# Patient Record
Sex: Male | Born: 1950 | Race: Black or African American | Hispanic: No | Marital: Married | Smoking: Former smoker
Health system: Southern US, Community
[De-identification: ages and names within clinical notes are randomized; demographics above are authoritative.]

## PROBLEM LIST (undated history)

## (undated) DIAGNOSIS — M1990 Unspecified osteoarthritis, unspecified site: Secondary | ICD-10-CM

## (undated) DIAGNOSIS — I10 Essential (primary) hypertension: Secondary | ICD-10-CM

## (undated) HISTORY — DX: Essential (primary) hypertension: I10

## (undated) HISTORY — DX: Unspecified osteoarthritis, unspecified site: M19.90

---

## 2014-10-20 LAB — HM COLONOSCOPY

## 2020-01-12 ENCOUNTER — Inpatient Hospital Stay: Payer: Medicare Other | Attending: Oncology | Admitting: Oncology

## 2020-01-12 ENCOUNTER — Other Ambulatory Visit: Payer: Self-pay

## 2020-01-12 VITALS — BP 152/65 | HR 53 | Temp 97.1°F | Resp 18 | Ht 70.5 in | Wt 204.4 lb

## 2020-01-12 DIAGNOSIS — Z7189 Other specified counseling: Secondary | ICD-10-CM | POA: Insufficient documentation

## 2020-01-12 DIAGNOSIS — G629 Polyneuropathy, unspecified: Secondary | ICD-10-CM

## 2020-01-12 DIAGNOSIS — I10 Essential (primary) hypertension: Secondary | ICD-10-CM

## 2020-01-12 DIAGNOSIS — C680 Malignant neoplasm of urethra: Secondary | ICD-10-CM | POA: Insufficient documentation

## 2020-01-12 DIAGNOSIS — E785 Hyperlipidemia, unspecified: Secondary | ICD-10-CM | POA: Diagnosis not present

## 2020-01-12 DIAGNOSIS — C774 Secondary and unspecified malignant neoplasm of inguinal and lower limb lymph nodes: Secondary | ICD-10-CM

## 2020-01-12 DIAGNOSIS — Z79899 Other long term (current) drug therapy: Secondary | ICD-10-CM

## 2020-01-12 DIAGNOSIS — Z9221 Personal history of antineoplastic chemotherapy: Secondary | ICD-10-CM

## 2020-01-12 DIAGNOSIS — Z923 Personal history of irradiation: Secondary | ICD-10-CM

## 2020-01-12 NOTE — Progress Notes (Signed)
[facility] OFF [facility] - Other   [id number]:Atezolizumab 1,200 mg q21 Days:   A cycle is every 21 days:     Atezolizumab   **Always confirm dose/schedule in your pharmacy ordering system**  Patient Characteristics: Intent of Therapy: Curative Intent, Discussed with Patient

## 2020-01-12 NOTE — Progress Notes (Signed)
Reason for the request:    Carcinoma of the distal urethra  HPI: I was asked by Dr. Daine Gravel  to evaluate Mario Hubbard for the diagnosis of urethral cancer. He is a 69 year old man relocated from [place] and previously was diagnosed with advanced malignancy while living in [place]. In 2015 he was found to have a left inguinal adenopathy that was biopsied which showed metastatic moderately differentiated squamous cell carcinoma. 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 He underwent surgical resection followed by radiation therapy.  He subsequently developed metastatic disease with right-sided pelvic lymph node and confirmed the presence of relapsed disease that is PET avid in 2017. He was treated with carboplatin and Taxol initially with excellent response after 3 cycles. 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 He did show worsening disease in October 2018 and subsequently was started on Tecentriq. He relocated to [place] and received Tecentriq maintenance therapy with excellent response. CT scan in August 2021 continues to show no evidence of disease. 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   He feels reasonably well without any hematuria dysuria. He denies any weight loss or appetite changes. His performance status quality of life remained excellent.  He does not report any headaches, blurry vision, syncope or seizures. Does not report any fevers, chills or sweats.  Does not report any cough, wheezing or hemoptysis.  Does not report any chest pain, palpitation, orthopnea or leg edema.  Does not report any nausea, vomiting or abdominal pain.  Does not report any constipation or diarrhea.  Does not report any skeletal complaints.    Does not report  frequency, urgency or hematuria.  Does not report any skin rashes or lesions. Does not report any heat or cold intolerance.  Does not report any lymphadenopathy or petechiae.  Does not report any anxiety or depression.  Remaining review of systems is negative.    He has past medical history significant for hypertension and hyperlipidemia.  Medication reviewed today.  He has no allergies.  Social History  Denied smoking or excessive alcohol use. Socioeconomic History  . Marital status: Married    Spouse name: Not on file  . Number of children: Not on file  . Years of education: Not on file  . Highest education level: Not on file  Occupational History  . Not on file  Tobacco Use  . Smoking status: Not on file  Substance and Sexual Activity  . Alcohol use: Not on file  . Drug use: Not on file  . Sexual activity: Not on file  Other Topics Concern  . Not on file  Social History Narrative  . Not on file   Social Determinants of Health   Financial Resource Strain:   . Difficulty of Paying Living Expenses: Not on file  Food Insecurity:   . Worried About Charity fundraiser in the Last Year: Not on file  . Ran Out of Food in the Last Year: Not on file  Transportation Needs:   . Lack of Transportation (Medical): Not on file  . Lack of Transportation (Non-Medical): Not on file  Physical Activity:   . Days of Exercise per Week: Not on file  . Minutes of Exercise per Session: Not on file  Stress:   . Feeling of Stress : Not on file  Social Connections:   . Frequency of Communication with Friends and Family: Not on file  . Frequency of Social Gatherings with Friends and Family: Not on file  . Attends Religious Services: Not on file  . Active Member of Clubs or Organizations: Not on file  . Attends Archivist Meetings: Not on file  . Marital Status: Not on file  Intimate Partner Violence:   . Fear of Current or Ex-Partner: Not on file  . Emotionally Abused: Not on  file  . Physically Abused: Not on file  . Sexually Abused: Not on file  :  Pertinent items are noted in HPI.  Exam:  General appearance: alert and cooperative appeared without distress. Head: atraumatic without any abnormalities. Eyes: conjunctivae/corneas clear. PERRL.  Sclera anicteric. Throat: lips, mucosa, and tongue normal; without oral thrush or ulcers. Resp: clear to auscultation bilaterally without rhonchi, wheezes or dullness to percussion. Cardio: regular rate and rhythm, S1, S2 normal, no murmur, click, rub or gallop GI: soft, non-tender; bowel sounds normal; no masses,  no organomegaly Skin: Skin color, texture, turgor normal. No rashes or lesions Lymph nodes: Cervical, supraclavicular, and axillary nodes normal. Neurologic: Grossly normal without any motor, sensory or deep tendon reflexes. Musculoskeletal: No joint deformity or effusion.    Assessment and Plan:    69 year old with:  1. 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 He was found to have advanced disease with left inguinal adenopathy. He is status post inguinal lymph node resection, radiation and systemic therapy. He has been receiving Tecentriq maintenance since that time.   The natural course of this disease was reviewed and treatment options were discussed. He has achieved a complete response on Tecentriq which she has been on it for the last 3 years. Risks and benefits of continuing this therapy versus therapy discontinuation were discussed I recommended continued maintenance therapy for the time being and consideration for stopping this treatment if his neck scan continues to show no evidence of disease. Complication associated with this therapy and that include nausea, vomiting, immune mediated complications and pruritus.  He is agreeable to proceed at this time. He is already scheduled to  receive his next cycle on October 11 in [place]. He will receive his subsequent cycles locally after the. The plan is to repeat imaging studies in December 2021.  He will establish care with a urology as well for maintenance cystoscopies.  2. IV access: Port-A-Cath is in place and has been in years. We will obtain a chest x-ray to document placement.  3. Antiemetics: Prescription for Compazine will be made available to him.  4. Follow-up: He will follow up while on the week of November 2 for the next cycle of Tecentriq.   60  minutes were dedicated to this visit. The time was spent on reviewing laboratory data, imaging studies, discussing treatment options, and answering questions regarding future plan.  A copy of this consult has been forwarded to the requesting physician.

## 2020-01-20 ENCOUNTER — Telehealth: Payer: Self-pay | Admitting: Oncology

## 2020-01-20 NOTE — Telephone Encounter (Signed)
Scheduled per 10/06 los, patient has been called and notified.

## 2020-02-09 ENCOUNTER — Telehealth: Payer: Self-pay

## 2020-02-09 ENCOUNTER — Other Ambulatory Visit: Payer: Self-pay

## 2020-02-09 ENCOUNTER — Ambulatory Visit (HOSPITAL_COMMUNITY)
Admission: RE | Admit: 2020-02-09 | Discharge: 2020-02-09 | Disposition: A | Discharge status: Home / Self Care | Payer: Medicare Other | Source: Ambulatory Visit | Attending: Oncology | Admitting: Oncology

## 2020-02-09 ENCOUNTER — Inpatient Hospital Stay: Payer: Medicare Other

## 2020-02-09 ENCOUNTER — Encounter: Payer: Self-pay | Admitting: Oncology

## 2020-02-09 ENCOUNTER — Inpatient Hospital Stay: Payer: Medicare Other | Attending: Oncology

## 2020-02-09 VITALS — BP 140/77 | HR 57 | Temp 97.9°F | Resp 18 | Ht 70.5 in | Wt 202.5 lb

## 2020-02-09 DIAGNOSIS — C774 Secondary and unspecified malignant neoplasm of inguinal and lower limb lymph nodes: Secondary | ICD-10-CM | POA: Insufficient documentation

## 2020-02-09 DIAGNOSIS — Z923 Personal history of irradiation: Secondary | ICD-10-CM | POA: Insufficient documentation

## 2020-02-09 DIAGNOSIS — C680 Malignant neoplasm of urethra: Secondary | ICD-10-CM | POA: Diagnosis present

## 2020-02-09 DIAGNOSIS — Z5112 Encounter for antineoplastic immunotherapy: Secondary | ICD-10-CM | POA: Diagnosis not present

## 2020-02-09 DIAGNOSIS — Z79899 Other long term (current) drug therapy: Secondary | ICD-10-CM | POA: Insufficient documentation

## 2020-02-09 DIAGNOSIS — G629 Polyneuropathy, unspecified: Secondary | ICD-10-CM | POA: Diagnosis not present

## 2020-02-09 DIAGNOSIS — E079 Disorder of thyroid, unspecified: Secondary | ICD-10-CM | POA: Diagnosis not present

## 2020-02-09 DIAGNOSIS — Z95828 Presence of other vascular implants and grafts: Secondary | ICD-10-CM

## 2020-02-09 LAB — CBC WITH DIFFERENTIAL (CANCER CENTER ONLY)
Abs Immature Granulocytes: 0.01 10*3/uL (ref 0.00–0.07)
Basophils Absolute: 0 10*3/uL (ref 0.0–0.1)
Basophils Relative: 0 %
Eosinophils Absolute: 0.3 10*3/uL (ref 0.0–0.5)
Eosinophils Relative: 7 %
HCT: 39 % (ref 39.0–52.0)
Hemoglobin: 12.9 g/dL — ABNORMAL LOW (ref 13.0–17.0)
Immature Granulocytes: 0 %
Lymphocytes Relative: 38 %
Lymphs Abs: 1.8 10*3/uL (ref 0.7–4.0)
MCH: 28 pg (ref 26.0–34.0)
MCHC: 33.1 g/dL (ref 30.0–36.0)
MCV: 84.8 fL (ref 80.0–100.0)
Monocytes Absolute: 0.4 10*3/uL (ref 0.1–1.0)
Monocytes Relative: 9 %
Neutro Abs: 2.2 10*3/uL (ref 1.7–7.7)
Neutrophils Relative %: 46 %
Platelet Count: 200 10*3/uL (ref 150–400)
RBC: 4.6 MIL/uL (ref 4.22–5.81)
RDW: 13.8 % (ref 11.5–15.5)
WBC Count: 4.8 10*3/uL (ref 4.0–10.5)
nRBC: 0 % (ref 0.0–0.2)

## 2020-02-09 LAB — CMP (CANCER CENTER ONLY)
ALT: 21 U/L (ref 0–44)
AST: 31 U/L (ref 15–41)
Albumin: 3.9 g/dL (ref 3.5–5.0)
Alkaline Phosphatase: 72 U/L (ref 38–126)
Anion gap: 8 (ref 5–15)
BUN: 16 mg/dL (ref 8–23)
CO2: 30 mmol/L (ref 22–32)
Calcium: 9.4 mg/dL (ref 8.9–10.3)
Chloride: 102 mmol/L (ref 98–111)
Creatinine: 1.05 mg/dL (ref 0.61–1.24)
GFR, Estimated: >60 mL/min (ref >60)
Glucose, Bld: 96 mg/dL (ref 70–99)
Potassium: 3.1 mmol/L — ABNORMAL LOW (ref 3.5–5.1)
Sodium: 140 mmol/L (ref 135–145)
Total Bilirubin: 0.5 mg/dL (ref 0.3–1.2)
Total Protein: 7.2 g/dL (ref 6.5–8.1)

## 2020-02-09 MED ORDER — HEPARIN SOD (PORK) LOCK FLUSH 100 UNIT/ML IV SOLN
500.0000 [IU] | Freq: Once | INTRAVENOUS | Status: AC | PRN
  Administered 2020-02-09: 500 [IU]
  Filled 2020-02-09: qty 5

## 2020-02-09 MED ORDER — SODIUM CHLORIDE 0.9% FLUSH
10.0000 mL | INTRAVENOUS | Status: DC | PRN
  Administered 2020-02-09: 10 mL
  Filled 2020-02-09: qty 10

## 2020-02-09 MED ORDER — SODIUM CHLORIDE 0.9 % IV SOLN
1200.0000 mg | Freq: Once | INTRAVENOUS | Status: AC
  Administered 2020-02-09: 1200 mg via INTRAVENOUS
  Filled 2020-02-09: qty 20

## 2020-02-09 MED ORDER — SODIUM CHLORIDE 0.9 % IV SOLN
Freq: Once | INTRAVENOUS | Status: AC
  Filled 2020-02-09: qty 250

## 2020-02-09 MED ORDER — SODIUM CHLORIDE 0.9% FLUSH
10.0000 mL | Freq: Once | INTRAVENOUS | Status: AC
  Administered 2020-02-09: 10 mL via INTRAVENOUS
  Filled 2020-02-09: qty 10

## 2020-02-09 NOTE — Patient Instructions (Signed)
[facility] Discharge Instructions for Patients Receiving Chemotherapy  Today you received the following chemotherapy agents Tecentriq  To help prevent nausea and vomiting after your treatment, we encourage you to take your nausea medication as directed.   If you develop nausea and vomiting that is not controlled by your nausea medication, call the clinic.   BELOW ARE SYMPTOMS THAT SHOULD BE REPORTED IMMEDIATELY:  *FEVER GREATER THAN 100.5 F  *CHILLS WITH OR WITHOUT FEVER  NAUSEA AND VOMITING THAT IS NOT CONTROLLED WITH YOUR NAUSEA MEDICATION  *UNUSUAL SHORTNESS OF BREATH  *UNUSUAL BRUISING OR BLEEDING  TENDERNESS IN MOUTH AND THROAT WITH OR WITHOUT PRESENCE OF ULCERS  *URINARY PROBLEMS  *BOWEL PROBLEMS  UNUSUAL RASH Items with * indicate a potential emergency and should be followed up as soon as possible.  Feel free to call the clinic should you have any questions or concerns. The clinic phone number is [phone].  Please show the [facility] at check-in to the Emergency Department and triage nurse.  Atezolizumab injection What is this medicine? ATEZOLIZUMAB (a te zoe LIZ ue mab) is a monoclonal antibody. It is used to treat bladder cancer (urothelial cancer), liver cancer, lung cancer, breast cancer, and melanoma. This medicine may be used for other purposes; ask your health care provider or pharmacist if you have questions. COMMON BRAND NAME(S): Tecentriq What should I tell my health care provider before I take this medicine? They need to know if you have any of these conditions:  diabetes  immune system problems  infection  inflammatory bowel disease  liver disease  lung or breathing disease  lupus  nervous system problems like myasthenia gravis or Guillain-Barre syndrome  organ transplant  an unusual or allergic reaction to atezolizumab, other medicines, foods, dyes, or preservatives  pregnant or trying to get  pregnant  breast-feeding How should I use this medicine? This medicine is for infusion into a vein. It is given by a health care professional in a hospital or clinic setting. A special MedGuide will be given to you before each treatment. Be sure to read this information carefully each time. Talk to your pediatrician regarding the use of this medicine in children. Special care may be needed. Overdosage: If you think you have taken too much of this medicine contact a poison control center or emergency room at once. NOTE: This medicine is only for you. Do not share this medicine with others. What if I miss a dose? It is important not to miss your dose. Call your doctor or health care professional if you are unable to keep an appointment. What may interact with this medicine? Interactions have not been studied. This list may not describe all possible interactions. Give your health care provider a list of all the medicines, herbs, non-prescription drugs, or dietary supplements you use. Also tell them if you smoke, drink alcohol, or use illegal drugs. Some items may interact with your medicine. What should I watch for while using this medicine? Your condition will be monitored carefully while you are receiving this medicine. You may need blood work done while you are taking this medicine. Do not become pregnant while taking this medicine or for at least 5 months after stopping it. Women should inform their doctor if they wish to become pregnant or think they might be pregnant. There is a potential for serious side effects to an unborn child. Talk to your health care professional or pharmacist for more information. Do not breast-feed an infant while  taking this medicine or for at least 5 months after the last dose. What side effects may I notice from receiving this medicine? Side effects that you should report to your doctor or health care professional as soon as possible:  allergic reactions like skin  rash, itching or hives, swelling of the face, lips, or tongue  black, tarry stools  bloody or watery diarrhea  breathing problems  changes in vision  chest pain or chest tightness  chills  facial flushing  fever  headache  signs and symptoms of high blood sugar such as dizziness; dry mouth; dry skin; fruity breath; nausea; stomach pain; increased hunger or thirst; increased urination  signs and symptoms of liver injury like dark yellow or brown urine; general ill feeling or flu-like symptoms; light-colored stools; loss of appetite; nausea; right upper belly pain; unusually weak or tired; yellowing of the eyes or skin  stomach pain  trouble passing urine or change in the amount of urine Side effects that usually do not require medical attention (report to your doctor or health care professional if they continue or are bothersome):  bone pain  cough  diarrhea  joint pain  muscle pain  muscle weakness  swelling of arms or legs  tiredness  weight loss This list may not describe all possible side effects. Call your doctor for medical advice about side effects. You may report side effects to FDA at 1-800-FDA-1088. Where should I keep my medicine? This drug is given in a hospital or clinic and will not be stored at home. NOTE: This sheet is a summary. It may not cover all possible information. If you have questions about this medicine, talk to your doctor, pharmacist, or health care provider.  2020 Elsevier/Gold Standard (2018-11-14 13:11:14)

## 2020-02-09 NOTE — Telephone Encounter (Signed)
Called patient to go and have an chest xray this morning before his appointments today so port placement can be checked. Patient verbalized understanding and stated he will come in at 48 or 1130 to have xray done.

## 2020-02-09 NOTE — Progress Notes (Signed)
Met with patient at registration to introduce myself as Financial Resource Specialist and to offer available resources.  Discussed one-time $1000 [employer] grant and qualifications to assist with personal expenses while going through treatment.  Gave him my card if interested in applying and for any additional financial questions or concerns.  

## 2020-03-01 ENCOUNTER — Inpatient Hospital Stay: Payer: Medicare Other

## 2020-03-01 ENCOUNTER — Other Ambulatory Visit: Payer: Self-pay

## 2020-03-01 ENCOUNTER — Inpatient Hospital Stay (HOSPITAL_BASED_OUTPATIENT_CLINIC_OR_DEPARTMENT_OTHER): Payer: Medicare Other | Admitting: Oncology

## 2020-03-01 ENCOUNTER — Other Ambulatory Visit: Payer: Self-pay | Admitting: Oncology

## 2020-03-01 VITALS — BP 142/73 | HR 65 | Temp 97.8°F | Resp 18 | Ht 70.0 in

## 2020-03-01 DIAGNOSIS — Z5112 Encounter for antineoplastic immunotherapy: Secondary | ICD-10-CM | POA: Diagnosis not present

## 2020-03-01 DIAGNOSIS — C680 Malignant neoplasm of urethra: Secondary | ICD-10-CM | POA: Diagnosis not present

## 2020-03-01 DIAGNOSIS — Z95828 Presence of other vascular implants and grafts: Secondary | ICD-10-CM | POA: Diagnosis not present

## 2020-03-01 DIAGNOSIS — E032 Hypothyroidism due to medicaments and other exogenous substances: Secondary | ICD-10-CM

## 2020-03-01 LAB — CBC WITH DIFFERENTIAL (CANCER CENTER ONLY)
Abs Immature Granulocytes: 0.01 10*3/uL (ref 0.00–0.07)
Basophils Absolute: 0 10*3/uL (ref 0.0–0.1)
Basophils Relative: 1 %
Eosinophils Absolute: 0.4 10*3/uL (ref 0.0–0.5)
Eosinophils Relative: 8 %
HCT: 38.3 % — ABNORMAL LOW (ref 39.0–52.0)
Hemoglobin: 12.8 g/dL — ABNORMAL LOW (ref 13.0–17.0)
Immature Granulocytes: 0 %
Lymphocytes Relative: 33 %
Lymphs Abs: 1.6 10*3/uL (ref 0.7–4.0)
MCH: 28.6 pg (ref 26.0–34.0)
MCHC: 33.4 g/dL (ref 30.0–36.0)
MCV: 85.5 fL (ref 80.0–100.0)
Monocytes Absolute: 0.4 10*3/uL (ref 0.1–1.0)
Monocytes Relative: 8 %
Neutro Abs: 2.4 10*3/uL (ref 1.7–7.7)
Neutrophils Relative %: 50 %
Platelet Count: 207 10*3/uL (ref 150–400)
RBC: 4.48 MIL/uL (ref 4.22–5.81)
RDW: 14.1 % (ref 11.5–15.5)
WBC Count: 4.9 10*3/uL (ref 4.0–10.5)
nRBC: 0 % (ref 0.0–0.2)

## 2020-03-01 LAB — CMP (CANCER CENTER ONLY)
ALT: 39 U/L (ref 0–44)
AST: 41 U/L (ref 15–41)
Albumin: 3.8 g/dL (ref 3.5–5.0)
Alkaline Phosphatase: 75 U/L (ref 38–126)
Anion gap: 11 (ref 5–15)
BUN: 16 mg/dL (ref 8–23)
CO2: 27 mmol/L (ref 22–32)
Calcium: 9.5 mg/dL (ref 8.9–10.3)
Chloride: 103 mmol/L (ref 98–111)
Creatinine: 1.12 mg/dL (ref 0.61–1.24)
GFR, Estimated: >60 mL/min (ref >60)
Glucose, Bld: 86 mg/dL (ref 70–99)
Potassium: 3.1 mmol/L — ABNORMAL LOW (ref 3.5–5.1)
Sodium: 141 mmol/L (ref 135–145)
Total Bilirubin: 0.4 mg/dL (ref 0.3–1.2)
Total Protein: 7.1 g/dL (ref 6.5–8.1)

## 2020-03-01 MED ORDER — SODIUM CHLORIDE 0.9% FLUSH
10.0000 mL | INTRAVENOUS | Status: DC | PRN
  Administered 2020-03-01: 10 mL via INTRAVENOUS
  Filled 2020-03-01: qty 10

## 2020-03-01 MED ORDER — SODIUM CHLORIDE 0.9 % IV SOLN
1200.0000 mg | Freq: Once | INTRAVENOUS | Status: AC
  Administered 2020-03-01: 1200 mg via INTRAVENOUS
  Filled 2020-03-01: qty 20

## 2020-03-01 MED ORDER — HEPARIN SOD (PORK) LOCK FLUSH 100 UNIT/ML IV SOLN
500.0000 [IU] | Freq: Once | INTRAVENOUS | Status: AC | PRN
  Administered 2020-03-01: 500 [IU]
  Filled 2020-03-01: qty 5

## 2020-03-01 MED ORDER — SODIUM CHLORIDE 0.9% FLUSH
10.0000 mL | INTRAVENOUS | Status: DC | PRN
  Administered 2020-03-01: 10 mL
  Filled 2020-03-01: qty 10

## 2020-03-01 MED ORDER — SODIUM CHLORIDE 0.9 % IV SOLN
Freq: Once | INTRAVENOUS | Status: AC
  Filled 2020-03-01: qty 250

## 2020-03-01 NOTE — Progress Notes (Signed)
Hematology and Oncology Follow Up Visit  Mario Hubbard [id number] July 01, 1950 69 y.o. 03/01/2020 8:14 AM Patient, No Pcp PerNo ref. 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   Prior Therapy: He underwent surgical resection followed by radiation therapy in 2015.  He subsequently developed metastatic disease with right-sided pelvic lymph node and confirmed the presence of relapsed disease that is PET avid in 2017.   He was treated with carboplatin and Taxol initially with excellent response after 3 cycles. 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   He did show worsening disease in October 2018 and subsequently was started on Tecentriq. He relocated to [place] and received Tecentriq maintenance therapy with excellent response. CT scan in August 2021 continues to show no evidence of disease.  Current therapy: Tecentriq 1200 mg every 3 weeks started in 2018 while living in [place].  He is currently receiving it locally started on February 09, 2020.  Interim History: Mario Hubbard returns today for a follow-up visit.  Since the last visit, he reports no major changes in his health.  He has tolerated Tecentriq infusion without any new complaints.  He denies any nausea vomiting or abdominal pain.  He denies any shortness of breath or difficulty breathing.  He denies any diarrhea or appetite changes.     Medications: I have reviewed the patient's current medications.  Current Outpatient Medications  Medication Sig Dispense Refill  . amLODipine (NORVASC) 10 MG tablet Take 10 mg by mouth daily.    Marland Kitchen atorvastatin (LIPITOR) 10 MG tablet Take 10 mg by mouth daily.    Marland Kitchen losartan (COZAAR) 100 MG tablet Take 100 mg by mouth daily.    . metoprolol tartrate (LOPRESSOR) 50 MG tablet  Take 50 mg by mouth 2 (two) times daily.    . potassium gluconate 595 (99 K) MG TABS tablet Take 595 mg by mouth.    . sildenafil (VIAGRA) 50 MG tablet Take 50 mg by mouth daily as needed for erectile dysfunction.     No current facility-administered medications for this visit.     Allergies: No Known Allergies    Physical Exam: Blood pressure (!) 142/73, pulse 65, temperature 97.8 F (36.6 C), temperature source Tympanic, resp. rate 18, height 5\' 10"  (1.778 m), SpO2 99 %.    ECOG:     General appearance: Comfortable appearing without any discomfort Head: Normocephalic without any trauma Oropharynx: Mucous membranes are moist and pink without any thrush or ulcers. Eyes: Pupils are equal and round reactive to light. Lymph nodes: No cervical, supraclavicular, inguinal or axillary lymphadenopathy.   Heart:regular rate and rhythm.  S1 and S2 without leg edema. Lung: Clear without any rhonchi or wheezes.  No dullness to percussion. Abdomin: Soft, nontender, nondistended with good bowel sounds.  No hepatosplenomegaly. Musculoskeletal: No joint deformity or effusion.  Full range of motion noted. Neurological: No deficits noted on motor, sensory and deep tendon reflex exam. Skin: No petechial rash or dryness.  Appeared moist.      Lab Results: Lab Results  Component Value Date   WBC 4.8 02/09/2020   HGB 12.9 (L) 02/09/2020   HCT 39.0 02/09/2020   MCV 84.8 02/09/2020   PLT 200 02/09/2020     Chemistry      Component Value Date/Time   NA 140 02/09/2020 1330   K 3.1 (L) 02/09/2020 1330  CL 102 02/09/2020 1330   CO2 30 02/09/2020 1330   BUN 16 02/09/2020 1330   CREATININE 1.05 02/09/2020 1330      Component Value Date/Time   CALCIUM 9.4 02/09/2020 1330   ALKPHOS 72 02/09/2020 1330   AST 31 02/09/2020 1330   ALT 21 02/09/2020 1330   BILITOT 0.5 02/09/2020 1330        Impression and Plan:  69 year old with:  1.  Stage IV high-grade urothelial carcinoma with  squamous differentiation arising from the distal penis with the adenopathy.  Initially presented with localized disease in 2015 and subsequently developed stage IV disease and currently has no residual disease noted.   He continues to be on Tecentriq Maintenance after achieving a complete response to this current therapy.  Risks and benefits of continuing this treatment long-term were discussed.  Potential complications associated with it include GI toxicity, dermatological toxicity as well as immune mediated complications.  I recommended repeat imaging studies in December 2021.  He is agreeable to proceed with this plan.  2. IV access: Port-A-Cath currently in use without any issues.  3. Antiemetics: No nausea or vomiting reported.  Compazine is available to him.  4.  Immune mediated complications.  Continue to educate him about these complications including pneumonitis, colitis and thyroid disease.  He is not experiencing any at this time.  5. Follow-up: In 3 weeks for the next cycle of therapy.   30  minutes were spent on this encounter.  The time was dedicated to reviewing disease status, discussing complications related to his cancer and cancer therapy and future plan of care review.   Zola Button, MD 11/23/20218:14 AM

## 2020-03-01 NOTE — Patient Instructions (Signed)

## 2020-03-01 NOTE — Patient Instructions (Signed)
[facility] Discharge Instructions for Patients Receiving Chemotherapy  Today you received the following chemotherapy agents Tecentriq To help prevent nausea and vomiting after your treatment, we encourage you to take your nausea medication as prescribed.   If you develop nausea and vomiting that is not controlled by your nausea medication, call the clinic.   BELOW ARE SYMPTOMS THAT SHOULD BE REPORTED IMMEDIATELY:  *FEVER GREATER THAN 100.5 F  *CHILLS WITH OR WITHOUT FEVER  NAUSEA AND VOMITING THAT IS NOT CONTROLLED WITH YOUR NAUSEA MEDICATION  *UNUSUAL SHORTNESS OF BREATH  *UNUSUAL BRUISING OR BLEEDING  TENDERNESS IN MOUTH AND THROAT WITH OR WITHOUT PRESENCE OF ULCERS  *URINARY PROBLEMS  *BOWEL PROBLEMS  UNUSUAL RASH Items with * indicate a potential emergency and should be followed up as soon as possible.  Feel free to call the clinic should you have any questions or concerns. The clinic phone number is [phone].  Please show the CHEMO ALERT CARD at check-in to the Emergency Department and triage nurse.   

## 2020-03-02 ENCOUNTER — Telehealth: Payer: Self-pay | Admitting: Hematology and Oncology

## 2020-03-02 NOTE — Telephone Encounter (Signed)
Scheduled per 11/23 los. Pt will receive an updated appt calendar per next visit appt notes  Dr. Alen Blew

## 2020-03-21 ENCOUNTER — Ambulatory Visit (HOSPITAL_COMMUNITY)
Admission: RE | Admit: 2020-03-21 | Discharge: 2020-03-21 | Disposition: A | Discharge status: Home / Self Care | Payer: Medicare Other | Source: Ambulatory Visit | Attending: Oncology | Admitting: Oncology

## 2020-03-21 ENCOUNTER — Other Ambulatory Visit: Payer: Self-pay

## 2020-03-21 ENCOUNTER — Encounter (HOSPITAL_COMMUNITY): Payer: Self-pay

## 2020-03-21 DIAGNOSIS — C680 Malignant neoplasm of urethra: Secondary | ICD-10-CM | POA: Insufficient documentation

## 2020-03-21 MED ORDER — SODIUM CHLORIDE (PF) 0.9 % IJ SOLN
INTRAMUSCULAR | Status: AC
  Filled 2020-03-21: qty 50

## 2020-03-21 MED ORDER — HEPARIN SOD (PORK) LOCK FLUSH 100 UNIT/ML IV SOLN: 500.0000 [IU] | Freq: Once | INTRAVENOUS | Status: DC

## 2020-03-21 MED ORDER — IOHEXOL 300 MG/ML  SOLN
100.0000 mL | Freq: Once | INTRAMUSCULAR | Status: AC | PRN
  Administered 2020-03-21: 08:00:00 100 mL via INTRAVENOUS

## 2020-03-21 MED ORDER — HEPARIN SOD (PORK) LOCK FLUSH 100 UNIT/ML IV SOLN
INTRAVENOUS | Status: AC
  Filled 2020-03-21: qty 5

## 2020-03-22 ENCOUNTER — Inpatient Hospital Stay: Payer: Medicare Other

## 2020-03-22 ENCOUNTER — Inpatient Hospital Stay (HOSPITAL_BASED_OUTPATIENT_CLINIC_OR_DEPARTMENT_OTHER): Payer: Medicare Other | Admitting: Oncology

## 2020-03-22 ENCOUNTER — Other Ambulatory Visit: Payer: Self-pay

## 2020-03-22 ENCOUNTER — Inpatient Hospital Stay: Payer: Medicare Other | Attending: Oncology

## 2020-03-22 VITALS — BP 145/62 | HR 53 | Temp 97.5°F | Resp 15 | Ht 70.0 in | Wt 200.2 lb

## 2020-03-22 DIAGNOSIS — C680 Malignant neoplasm of urethra: Secondary | ICD-10-CM

## 2020-03-22 DIAGNOSIS — Z95828 Presence of other vascular implants and grafts: Secondary | ICD-10-CM

## 2020-03-22 DIAGNOSIS — Z79899 Other long term (current) drug therapy: Secondary | ICD-10-CM | POA: Insufficient documentation

## 2020-03-22 DIAGNOSIS — C774 Secondary and unspecified malignant neoplasm of inguinal and lower limb lymph nodes: Secondary | ICD-10-CM | POA: Insufficient documentation

## 2020-03-22 DIAGNOSIS — E032 Hypothyroidism due to medicaments and other exogenous substances: Secondary | ICD-10-CM

## 2020-03-22 DIAGNOSIS — Z5112 Encounter for antineoplastic immunotherapy: Secondary | ICD-10-CM | POA: Insufficient documentation

## 2020-03-22 DIAGNOSIS — G629 Polyneuropathy, unspecified: Secondary | ICD-10-CM | POA: Diagnosis not present

## 2020-03-22 DIAGNOSIS — Z23 Encounter for immunization: Secondary | ICD-10-CM

## 2020-03-22 DIAGNOSIS — Z923 Personal history of irradiation: Secondary | ICD-10-CM | POA: Diagnosis not present

## 2020-03-22 DIAGNOSIS — Z8549 Personal history of malignant neoplasm of other male genital organs: Secondary | ICD-10-CM | POA: Diagnosis not present

## 2020-03-22 LAB — CMP (CANCER CENTER ONLY)
ALT: 18 U/L (ref 0–44)
AST: 17 U/L (ref 15–41)
Albumin: 3.8 g/dL (ref 3.5–5.0)
Alkaline Phosphatase: 78 U/L (ref 38–126)
Anion gap: 6 (ref 5–15)
BUN: 15 mg/dL (ref 8–23)
CO2: 29 mmol/L (ref 22–32)
Calcium: 9.9 mg/dL (ref 8.9–10.3)
Chloride: 103 mmol/L (ref 98–111)
Creatinine: 1.14 mg/dL (ref 0.61–1.24)
GFR, Estimated: >60 mL/min (ref >60)
Glucose, Bld: 93 mg/dL (ref 70–99)
Potassium: 3.6 mmol/L (ref 3.5–5.1)
Sodium: 138 mmol/L (ref 135–145)
Total Bilirubin: 0.6 mg/dL (ref 0.3–1.2)
Total Protein: 7.4 g/dL (ref 6.5–8.1)

## 2020-03-22 LAB — CBC WITH DIFFERENTIAL (CANCER CENTER ONLY)
Abs Immature Granulocytes: 0.01 10*3/uL (ref 0.00–0.07)
Basophils Absolute: 0 10*3/uL (ref 0.0–0.1)
Basophils Relative: 0 %
Eosinophils Absolute: 0.4 10*3/uL (ref 0.0–0.5)
Eosinophils Relative: 6 %
HCT: 40.8 % (ref 39.0–52.0)
Hemoglobin: 13.4 g/dL (ref 13.0–17.0)
Immature Granulocytes: 0 %
Lymphocytes Relative: 29 %
Lymphs Abs: 1.8 10*3/uL (ref 0.7–4.0)
MCH: 27.9 pg (ref 26.0–34.0)
MCHC: 32.8 g/dL (ref 30.0–36.0)
MCV: 84.8 fL (ref 80.0–100.0)
Monocytes Absolute: 0.5 10*3/uL (ref 0.1–1.0)
Monocytes Relative: 8 %
Neutro Abs: 3.5 10*3/uL (ref 1.7–7.7)
Neutrophils Relative %: 57 %
Platelet Count: 208 10*3/uL (ref 150–400)
RBC: 4.81 MIL/uL (ref 4.22–5.81)
RDW: 14.4 % (ref 11.5–15.5)
WBC Count: 6.2 10*3/uL (ref 4.0–10.5)
nRBC: 0 % (ref 0.0–0.2)

## 2020-03-22 LAB — TSH: TSH: 1.193 u[IU]/mL (ref 0.320–4.118)

## 2020-03-22 MED ORDER — SODIUM CHLORIDE 0.9% FLUSH
10.0000 mL | INTRAVENOUS | Status: DC | PRN
  Administered 2020-03-22: 09:00:00 10 mL via INTRAVENOUS
  Filled 2020-03-22: qty 10

## 2020-03-22 MED ORDER — SODIUM CHLORIDE 0.9 % IV SOLN
1200.0000 mg | Freq: Once | INTRAVENOUS | Status: AC
  Administered 2020-03-22: 11:00:00 1200 mg via INTRAVENOUS
  Filled 2020-03-22: qty 20

## 2020-03-22 MED ORDER — SODIUM CHLORIDE 0.9 % IV SOLN
Freq: Once | INTRAVENOUS | Status: AC
  Filled 2020-03-22: qty 250

## 2020-03-22 MED ORDER — SODIUM CHLORIDE 0.9% FLUSH
10.0000 mL | INTRAVENOUS | Status: DC | PRN
  Administered 2020-03-22: 12:00:00 10 mL
  Filled 2020-03-22: qty 10

## 2020-03-22 MED ORDER — HEPARIN SOD (PORK) LOCK FLUSH 100 UNIT/ML IV SOLN
500.0000 [IU] | Freq: Once | INTRAVENOUS | Status: AC | PRN
  Administered 2020-03-22: 12:00:00 500 [IU]
  Filled 2020-03-22: qty 5

## 2020-03-22 NOTE — Patient Instructions (Signed)

## 2020-03-22 NOTE — Patient Instructions (Signed)
[facility] Discharge Instructions for Patients Receiving Chemotherapy  Today you received the following chemotherapy agent: Tecentriq  To help prevent nausea and vomiting after your treatment, we encourage you to take your nausea medication  as prescribed.    If you develop nausea and vomiting that is not controlled by your nausea medication, call the clinic.   BELOW ARE SYMPTOMS THAT SHOULD BE REPORTED IMMEDIATELY:  *FEVER GREATER THAN 100.5 F  *CHILLS WITH OR WITHOUT FEVER  NAUSEA AND VOMITING THAT IS NOT CONTROLLED WITH YOUR NAUSEA MEDICATION  *UNUSUAL SHORTNESS OF BREATH  *UNUSUAL BRUISING OR BLEEDING  TENDERNESS IN MOUTH AND THROAT WITH OR WITHOUT PRESENCE OF ULCERS  *URINARY PROBLEMS  *BOWEL PROBLEMS  UNUSUAL RASH Items with * indicate a potential emergency and should be followed up as soon as possible.  Feel free to call the clinic should you have any questions or concerns. The clinic phone number is [phone].  Please show the [facility] at check-in to the Emergency Department and triage nurse.

## 2020-03-22 NOTE — Progress Notes (Signed)
Hematology and Oncology Follow Up Visit  Mario Hubbard [id number] [birth year] 69 y.o. 03/22/2020 8:27 AM Patient, No Pcp Mario Saas, MD   Principle Diagnosis: 70 year old with carcinoma of the distal penis diagnosed in 2015.  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   Prior Therapy: He underwent surgical resection followed by radiation therapy in 2015.  He subsequently developed metastatic disease with right-sided pelvic lymph node and confirmed the presence of relapsed disease that is PET avid in 2017.   He was treated with carboplatin and Taxol initially with excellent response after 3 cycles. 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   He did show worsening disease in October 2018 and subsequently was started on Tecentriq. He relocated to [place] and received Tecentriq maintenance therapy with excellent response. CT scan in August 2021 continues to show no evidence of disease.  Current therapy: Tecentriq 1200 mg every 3 weeks started in 2018 while living in [place].  He is currently receiving it locally started on February 09, 2020.  He is here for the next cycle of therapy.  Interim History: Mr. Mario Hubbard returns today for repeat evaluation.  Since the last visit, he reports no major changes in his health.  He has tolerated Tecentriq without any major complaints.  He denies any nausea, vomiting or abdominal pain.  He denies any worsening fatigue tiredness.  Denies any diarrhea or changes in his bowel habits.  He denies any respiratory complaints or decline in his performance status.  He remains reasonably active.     Medications: Updated on review. Current Outpatient Medications  Medication Sig Dispense Refill  . amLODipine (NORVASC) 10 MG tablet Take 10 mg by mouth daily.    Marland Kitchen atorvastatin (LIPITOR) 10 MG tablet Take 10  mg by mouth daily.    Marland Kitchen losartan (COZAAR) 100 MG tablet Take 100 mg by mouth daily.    . metoprolol tartrate (LOPRESSOR) 50 MG tablet Take 50 mg by mouth 2 (two) times daily.    . potassium gluconate 595 (99 K) MG TABS tablet Take 595 mg by mouth.    . sildenafil (VIAGRA) 50 MG tablet Take 50 mg by mouth daily as needed for erectile dysfunction.     No current facility-administered medications for this visit.     Allergies: No Known Allergies    Physical Exam:  Blood pressure (!) 145/62, pulse (!) 53, temperature (!) 97.5 F (36.4 C), temperature source Tympanic, resp. rate 15, height 5\' 10"  (1.778 m), weight 200 lb 3.2 oz (90.8 kg), SpO2 99 %.    ECOG: 0   General appearance: Alert, awake without any distress. Head: Atraumatic without abnormalities Oropharynx: Without any thrush or ulcers. Eyes: No scleral icterus. Lymph nodes: No lymphadenopathy noted in the cervical, supraclavicular, or axillary nodes Heart:regular rate and rhythm, without any murmurs or gallops.   Lung: Clear to auscultation without any rhonchi, wheezes or dullness to percussion. Abdomin: Soft, nontender without any shifting dullness or ascites. Musculoskeletal: No clubbing or cyanosis. Neurological: No motor or sensory deficits. Skin: No rashes or lesions.     Lab Results: Lab Results  Component Value Date   WBC 4.9 03/01/2020   HGB 12.8 (L) 03/01/2020   HCT 38.3 (L) 03/01/2020   MCV 85.5 03/01/2020   PLT 207 03/01/2020     Chemistry      Component Value Date/Time   NA 141 03/01/2020 0827  K 3.1 (L) 03/01/2020 0827   CL 103 03/01/2020 0827   CO2 27 03/01/2020 0827   BUN 16 03/01/2020 0827   CREATININE 1.12 03/01/2020 0827      Component Value Date/Time   CALCIUM 9.5 03/01/2020 0827   ALKPHOS 75 03/01/2020 0827   AST 41 03/01/2020 0827   ALT 39 03/01/2020 0827   BILITOT 0.4 03/01/2020 0827     IMPRESSION: 1. No definitive findings to suggest metastatic disease in the chest,  abdomen or pelvis. 2. Elongated filling defect in the gastric lumen. This may simply represent some ingested contents, however, the possibility of a pedunculated gastric polyp warrants consideration, particularly if the patient reports eating no medial prior to the examination. Further clinical evaluation is recommended, and follow-up endoscopy should be considered if clinically appropriate. 3. Colonic diverticulosis without evidence of acute diverticulitis at this time. 4. Aortic atherosclerosis, in addition to left main and 3 vessel coronary artery disease. Assessment for potential risk factor modification, dietary therapy or pharmacologic therapy may be warranted, if clinically indicated. 5. Diffuse bronchial wall thickening with mild centrilobular and paraseptal emphysema; imaging findings suggestive of underlying COPD. 6. Scattered areas of mid to upper lung predominant architectural distortion and volume loss which most likely reflects chronic post infectious or inflammatory scarring. 7. Additional incidental findings, as above    Impression and Plan:  69 year old with:  1.  Distal penile cancer diagnosed in 2015.  He subsequently developed stage IV high-grade urothelial carcinoma with squamous differentiation and pelvic adenopathy.   The natural course of his disease was reviewed at this time. Treatment options were reiterated.  Imaging studies obtained on 03/21/2020 showed no evidence of recurrent disease indicating a complete response.  He is currently on Tecentriq and risks and benefits of continuing treatment versus active surveillance were discussed.  Given his excellent response and tolerance I recommended continued maintenance therapy.  Different salvage therapy options will be used he has relapsed disease.  After discussion today he is agreeable to proceed.2. IV access: Port-A-Cath remains in use without any complications.  3. Antiemetics: Compazine is available to  him without any nausea or vomiting.  4.  Immune mediated complications.  He has not experienced any complication including pneumonitis, colitis, hepatitis or thyroid disease.  We will continue to monitor these on his current treatment.  5. Follow-up: He will return in 3 weeks for next cycle of therapy.   30  minutes were dedicated to this encounter.  The time was spent on reviewing disease status, treatment options and outlining future plan of care.   Zola Button, MD 12/14/20218:27 AM

## 2020-03-22 NOTE — Progress Notes (Signed)
Pt tolerated injection well, no issues reported during 15 min post observation period.

## 2020-04-04 ENCOUNTER — Other Ambulatory Visit: Payer: Self-pay

## 2020-04-04 ENCOUNTER — Ambulatory Visit (INDEPENDENT_AMBULATORY_CARE_PROVIDER_SITE_OTHER): Payer: Medicare Other | Admitting: Internal Medicine

## 2020-04-04 ENCOUNTER — Encounter: Payer: Self-pay | Admitting: Internal Medicine

## 2020-04-04 VITALS — BP 156/86 | HR 60 | Temp 97.6°F | Ht 69.0 in | Wt 201.0 lb

## 2020-04-04 DIAGNOSIS — N5201 Erectile dysfunction due to arterial insufficiency: Secondary | ICD-10-CM | POA: Diagnosis not present

## 2020-04-04 DIAGNOSIS — I2510 Atherosclerotic heart disease of native coronary artery without angina pectoris: Secondary | ICD-10-CM | POA: Diagnosis not present

## 2020-04-04 DIAGNOSIS — E785 Hyperlipidemia, unspecified: Secondary | ICD-10-CM

## 2020-04-04 DIAGNOSIS — C609 Malignant neoplasm of penis, unspecified: Secondary | ICD-10-CM | POA: Diagnosis not present

## 2020-04-04 DIAGNOSIS — I10 Essential (primary) hypertension: Secondary | ICD-10-CM

## 2020-04-04 DIAGNOSIS — E291 Testicular hypofunction: Secondary | ICD-10-CM

## 2020-04-04 LAB — LIPID PANEL
Cholesterol: 174 mg/dL (ref 0–200)
HDL: 46.3 mg/dL (ref >39.00)
LDL Cholesterol: 113 mg/dL — ABNORMAL HIGH (ref 0–99)
NonHDL: 128.17
Total CHOL/HDL Ratio: 4
Triglycerides: 75 mg/dL (ref 0.0–149.0)
VLDL: 15 mg/dL (ref 0.0–40.0)

## 2020-04-04 MED ORDER — INDAPAMIDE 2.5 MG PO TABS: 2.5000 mg | ORAL_TABLET | Freq: Every day | ORAL | 1 refills | Status: DC

## 2020-04-04 NOTE — Patient Instructions (Signed)

## 2020-04-04 NOTE — Progress Notes (Signed)
Subjective:  Patient ID: Mario Hubbard, male    DOB: [birth year]  Age: 69 y.o. MRN: [id number]  CC: Hypertension  This visit occurred during the SARS-CoV-2 public health emergency.  Safety protocols were in place, including screening questions prior to the visit, additional usage of staff PPE, and extensive cleaning of exam room while observing appropriate contact time as indicated for disinfecting solutions.   NEW TO ME  HPI Mario Hubbard presents for establishing.  He recently moved here from [place].  He is being treated for squamous cell carcinoma of the penis.  He recently had a CT scan done that showed coronary atherosclerosis.  He complains of erectile dysfunction.  He tells me that Viagra does not help much.  He is active and denies any recent episodes of chest pain, shortness of breath, palpitations, edema, or fatigue.  He has a history of hypertension and tells me he is taking amlodipine, losartan, and metoprolol.  He is also taking a statin and does not experience muscle or joint aches.  No prior medical records are available today.  History Mario Hubbard has a past medical history of Arthritis and Hypertension.   He has no past surgical history on file.   His family history includes Diabetes in his brother; Healthy in his sister; Hypertension in his mother; Kidney disease in his mother.He reports that he has quit smoking. He has never used smokeless tobacco. He reports that he does not drink alcohol and does not use drugs.  Outpatient Medications Prior to Visit  Medication Sig Dispense Refill  . amLODipine (NORVASC) 10 MG tablet Take 10 mg by mouth daily.    Marland Kitchen atorvastatin (LIPITOR) 10 MG tablet Take 10 mg by mouth daily.    Marland Kitchen losartan (COZAAR) 100 MG tablet Take 100 mg by mouth daily.    . metoprolol tartrate (LOPRESSOR) 50 MG tablet Take 50 mg by mouth 2 (two) times daily.    . potassium gluconate 595 (99 K) MG TABS tablet Take 595 mg by mouth.    . sildenafil (VIAGRA) 50 MG tablet  Take 50 mg by mouth daily as needed for erectile dysfunction.    . hydrochlorothiazide (HYDRODIURIL) 25 MG tablet Take 25 mg by mouth daily.     No facility-administered medications prior to visit.    ROS Review of Systems  Constitutional: Negative for appetite change, diaphoresis, fatigue and unexpected weight change.  HENT: Negative.  Negative for trouble swallowing.   Eyes: Negative.   Respiratory: Negative for cough, chest tightness, shortness of breath and wheezing.   Cardiovascular: Negative for chest pain, palpitations and leg swelling.  Gastrointestinal: Negative for abdominal pain, constipation, diarrhea, nausea and vomiting.  Endocrine: Negative.   Genitourinary: Negative.  Negative for difficulty urinating, dysuria, testicular pain and urgency.  Musculoskeletal: Negative.  Negative for myalgias.  Skin: Negative.  Negative for color change and pallor.  Neurological: Negative.  Negative for dizziness and weakness.  Hematological: Negative for adenopathy. Does not bruise/bleed easily.  Psychiatric/Behavioral: Negative.     Objective:  BP (!) 156/86   Pulse 60   Temp 97.6 F (36.4 C) (Oral)   Ht 5\' 9"  (1.753 m)   Wt 201 lb (91.2 kg)   SpO2 97%   BMI 29.68 kg/m   Physical Exam Vitals reviewed.  Constitutional:      Appearance: Normal appearance.  HENT:     Nose: Nose normal.     Mouth/Throat:     Mouth: Mucous membranes are moist.  Eyes:  General: No scleral icterus.    Conjunctiva/sclera: Conjunctivae normal.  Cardiovascular:     Rate and Rhythm: Regular rhythm. Bradycardia present.     Pulses: Normal pulses.     Heart sounds: No murmur heard. No gallop.      Comments: EKG - Sinus bradycardia, 54 bpm Otherwise normal EKG Pulmonary:     Effort: Pulmonary effort is normal.     Breath sounds: No stridor. No wheezing, rhonchi or rales.  Abdominal:     General: Abdomen is flat.     Palpations: There is no mass.     Tenderness: There is no abdominal  tenderness. There is no guarding.  Musculoskeletal:        General: Normal range of motion.     Cervical back: Neck supple.     Right lower leg: No edema.     Left lower leg: No edema.  Skin:    General: Skin is warm and dry.     Coloration: Skin is not pale.  Neurological:     General: No focal deficit present.     Mental Status: He is alert and oriented to person, place, and time.  Psychiatric:        Mood and Affect: Mood normal.        Behavior: Behavior normal.     Lab Results  Component Value Date   WBC 6.2 03/22/2020   HGB 13.4 03/22/2020   HCT 40.8 03/22/2020   PLT 208 03/22/2020   GLUCOSE 93 03/22/2020   CHOL 174 04/04/2020   TRIG 75.0 04/04/2020   HDL 46.30 04/04/2020   LDLCALC 113 (H) 04/04/2020   ALT 18 03/22/2020   AST 17 03/22/2020   NA 138 03/22/2020   K 3.6 03/22/2020   CL 103 03/22/2020   CREATININE 1.14 03/22/2020   BUN 15 03/22/2020   CO2 29 03/22/2020   TSH 1.193 03/22/2020   IMPRESSION: 1. No definitive findings to suggest metastatic disease in the chest, abdomen or pelvis. 2. Elongated filling defect in the gastric lumen. This may simply represent some ingested contents, however, the possibility of a pedunculated gastric polyp warrants consideration, particularly if the patient reports eating no medial prior to the examination. Further clinical evaluation is recommended, and follow-up endoscopy should be considered if clinically appropriate. 3. Colonic diverticulosis without evidence of acute diverticulitis at this time. 4. Aortic atherosclerosis, in addition to left main and 3 vessel coronary artery disease. Assessment for potential risk factor modification, dietary therapy or pharmacologic therapy may be warranted, if clinically indicated. 5. Diffuse bronchial wall thickening with mild centrilobular and paraseptal emphysema; imaging findings suggestive of underlying COPD. 6. Scattered areas of mid to upper lung predominant  architectural distortion and volume loss which most likely reflects chronic post infectious or inflammatory scarring. 7. Additional incidental findings, as above   Electronically Signed   By: Trudie Reed M.D.   On: 03/21/2020 08:20   Assessment & Plan:   Adham was seen today for hypertension.  Diagnoses and all orders for this visit:  Primary squamous cell carcinoma of penis (HCC) -     Ambulatory referral to Urology  Primary hypertension- His blood pressure is not adequately well controlled.  I recommend that he take indapamide in addition to his current antihypertensive regimen. -     indapamide (LOZOL) 2.5 MG tablet; Take 1 tablet (2.5 mg total) by mouth daily. -     EKG 12-Lead  Hyperlipidemia LDL goal <100- He has achieved his LDL goal and  is doing well on the statin. -     Lipid panel; Future -     Lipid panel  Erectile dysfunction due to arterial insufficiency- Low T may be the reason Viagra is not effective.  I have asked him to be screened for prostate cancer before considering using testosterone replacement therapy. -     Testosterone Total,Free,Bio, Males; Future- -     Testosterone Total,Free,Bio, Males  Atherosclerosis of native coronary artery of native heart without angina pectoris- This was seen on the CT scan.  He has no symptoms of ischemia and his EKG and MPI are normal.  Will continue to address risk factor modifications. -     EKG 12-Lead -     MYOCARDIAL PERFUSION IMAGING; Future -     Cardiac Stress Test: Informed Consent Details: Physician/Practitioner Attestation; Transcribe to consent form and obtain patient signature; Future  Hypogonadism male- See above.   I have discontinued Mario Hubbard's hydrochlorothiazide. I am also having him start on indapamide. Additionally, I am having him maintain his sildenafil, amLODipine, atorvastatin, losartan, metoprolol tartrate, and potassium gluconate.  Meds ordered this encounter  Medications  .  indapamide (LOZOL) 2.5 MG tablet    Sig: Take 1 tablet (2.5 mg total) by mouth daily.    Dispense:  90 tablet    Refill:  1   I spent 50 minutes in preparing to see the patient by review of recent labs, imaging and procedures, obtaining and reviewing separately obtained history, communicating with the patient and family or caregiver, ordering medications, tests or procedures, and documenting clinical information in the EHR including the differential Dx, treatment, and any further evaluation and other management of 1. Primary squamous cell carcinoma of penis ([place]) 2. Primary hypertension 3. Hyperlipidemia LDL goal <100 4. Erectile dysfunction due to arterial insufficiency 5. Atherosclerosis of native coronary artery of native heart without angina pectoris 6. Hypogonadism male     Follow-up: Return in about 3 months (around 07/03/2020).  Scarlette Calico, MD

## 2020-04-06 ENCOUNTER — Telehealth (HOSPITAL_COMMUNITY): Payer: Self-pay | Admitting: *Deleted

## 2020-04-06 DIAGNOSIS — E291 Testicular hypofunction: Secondary | ICD-10-CM | POA: Insufficient documentation

## 2020-04-06 LAB — TESTOSTERONE TOTAL,FREE,BIO, MALES
Albumin: 4.8 g/dL (ref 3.6–5.1)
Sex Hormone Binding: <2 nmol/L — ABNORMAL LOW (ref 22–77)
Testosterone: 180 ng/dL — ABNORMAL LOW (ref 250–827)

## 2020-04-06 NOTE — Telephone Encounter (Signed)
Close encounter 

## 2020-04-07 ENCOUNTER — Ambulatory Visit (HOSPITAL_COMMUNITY)
Admission: RE | Admit: 2020-04-07 | Discharge: 2020-04-07 | Disposition: A | Discharge status: Home / Self Care | Payer: Medicare Other | Source: Ambulatory Visit | Attending: Cardiology | Admitting: Cardiology

## 2020-04-07 ENCOUNTER — Other Ambulatory Visit: Payer: Self-pay

## 2020-04-07 DIAGNOSIS — I2510 Atherosclerotic heart disease of native coronary artery without angina pectoris: Secondary | ICD-10-CM | POA: Insufficient documentation

## 2020-04-07 LAB — MYOCARDIAL PERFUSION IMAGING
LV dias vol: 104 mL (ref 62–150)
LV sys vol: 45 mL
Peak HR: 81 {beats}/min
Rest HR: 51 {beats}/min
SDS: 2
SRS: 1
SSS: 3
TID: 1.05

## 2020-04-07 MED ORDER — REGADENOSON 0.4 MG/5ML IV SOLN
0.4000 mg | Freq: Once | INTRAVENOUS | Status: AC
  Administered 2020-04-07: 0.4 mg via INTRAVENOUS

## 2020-04-07 MED ORDER — TECHNETIUM TC 99M TETROFOSMIN IV KIT
10.1000 | PACK | Freq: Once | INTRAVENOUS | Status: AC | PRN
  Administered 2020-04-07: 08:00:00 10.1 via INTRAVENOUS
  Filled 2020-04-07: qty 11

## 2020-04-07 MED ORDER — TECHNETIUM TC 99M TETROFOSMIN IV KIT
31.0000 | PACK | Freq: Once | INTRAVENOUS | Status: AC | PRN
  Administered 2020-04-07: 09:00:00 31 via INTRAVENOUS
  Filled 2020-04-07: qty 31

## 2020-04-08 ENCOUNTER — Encounter: Payer: Self-pay | Admitting: Internal Medicine

## 2020-04-12 ENCOUNTER — Other Ambulatory Visit: Payer: Self-pay

## 2020-04-12 ENCOUNTER — Inpatient Hospital Stay: Payer: Medicare Other

## 2020-04-12 ENCOUNTER — Inpatient Hospital Stay (HOSPITAL_BASED_OUTPATIENT_CLINIC_OR_DEPARTMENT_OTHER): Payer: Medicare Other | Admitting: Oncology

## 2020-04-12 ENCOUNTER — Inpatient Hospital Stay: Payer: Medicare Other | Attending: Oncology

## 2020-04-12 VITALS — BP 138/56 | HR 56 | Temp 98.0°F | Resp 17 | Ht 69.0 in | Wt 201.6 lb

## 2020-04-12 DIAGNOSIS — C680 Malignant neoplasm of urethra: Secondary | ICD-10-CM | POA: Insufficient documentation

## 2020-04-12 DIAGNOSIS — C774 Secondary and unspecified malignant neoplasm of inguinal and lower limb lymph nodes: Secondary | ICD-10-CM | POA: Insufficient documentation

## 2020-04-12 DIAGNOSIS — Z923 Personal history of irradiation: Secondary | ICD-10-CM | POA: Diagnosis not present

## 2020-04-12 DIAGNOSIS — Z79899 Other long term (current) drug therapy: Secondary | ICD-10-CM | POA: Insufficient documentation

## 2020-04-12 DIAGNOSIS — G629 Polyneuropathy, unspecified: Secondary | ICD-10-CM | POA: Insufficient documentation

## 2020-04-12 DIAGNOSIS — Z5112 Encounter for antineoplastic immunotherapy: Secondary | ICD-10-CM | POA: Diagnosis present

## 2020-04-12 DIAGNOSIS — C609 Malignant neoplasm of penis, unspecified: Secondary | ICD-10-CM

## 2020-04-12 DIAGNOSIS — E032 Hypothyroidism due to medicaments and other exogenous substances: Secondary | ICD-10-CM

## 2020-04-12 DIAGNOSIS — Z95828 Presence of other vascular implants and grafts: Secondary | ICD-10-CM

## 2020-04-12 LAB — CBC WITH DIFFERENTIAL (CANCER CENTER ONLY)
Abs Immature Granulocytes: 0.01 10*3/uL (ref 0.00–0.07)
Basophils Absolute: 0 10*3/uL (ref 0.0–0.1)
Basophils Relative: 0 %
Eosinophils Absolute: 0.3 10*3/uL (ref 0.0–0.5)
Eosinophils Relative: 5 %
HCT: 39.6 % (ref 39.0–52.0)
Hemoglobin: 13 g/dL (ref 13.0–17.0)
Immature Granulocytes: 0 %
Lymphocytes Relative: 32 %
Lymphs Abs: 1.8 10*3/uL (ref 0.7–4.0)
MCH: 28.1 pg (ref 26.0–34.0)
MCHC: 32.8 g/dL (ref 30.0–36.0)
MCV: 85.5 fL (ref 80.0–100.0)
Monocytes Absolute: 0.5 10*3/uL (ref 0.1–1.0)
Monocytes Relative: 10 %
Neutro Abs: 3 10*3/uL (ref 1.7–7.7)
Neutrophils Relative %: 53 %
Platelet Count: 197 10*3/uL (ref 150–400)
RBC: 4.63 MIL/uL (ref 4.22–5.81)
RDW: 14.3 % (ref 11.5–15.5)
WBC Count: 5.7 10*3/uL (ref 4.0–10.5)
nRBC: 0 % (ref 0.0–0.2)

## 2020-04-12 LAB — CMP (CANCER CENTER ONLY)
ALT: 17 U/L (ref 0–44)
AST: 18 U/L (ref 15–41)
Albumin: 3.7 g/dL (ref 3.5–5.0)
Alkaline Phosphatase: 77 U/L (ref 38–126)
Anion gap: 7 (ref 5–15)
BUN: 16 mg/dL (ref 8–23)
CO2: 30 mmol/L (ref 22–32)
Calcium: 9.6 mg/dL (ref 8.9–10.3)
Chloride: 102 mmol/L (ref 98–111)
Creatinine: 1.16 mg/dL (ref 0.61–1.24)
GFR, Estimated: >60 mL/min (ref >60)
Glucose, Bld: 104 mg/dL — ABNORMAL HIGH (ref 70–99)
Potassium: 3.1 mmol/L — ABNORMAL LOW (ref 3.5–5.1)
Sodium: 139 mmol/L (ref 135–145)
Total Bilirubin: 0.5 mg/dL (ref 0.3–1.2)
Total Protein: 7.3 g/dL (ref 6.5–8.1)

## 2020-04-12 LAB — TSH: TSH: 0.629 u[IU]/mL (ref 0.320–4.118)

## 2020-04-12 MED ORDER — SODIUM CHLORIDE 0.9 % IV SOLN
1200.0000 mg | Freq: Once | INTRAVENOUS | Status: AC
  Administered 2020-04-12: 1200 mg via INTRAVENOUS
  Filled 2020-04-12: qty 20

## 2020-04-12 MED ORDER — SODIUM CHLORIDE 0.9% FLUSH
10.0000 mL | Freq: Once | INTRAVENOUS | Status: AC
  Administered 2020-04-12: 10 mL
  Filled 2020-04-12: qty 10

## 2020-04-12 MED ORDER — HEPARIN SOD (PORK) LOCK FLUSH 100 UNIT/ML IV SOLN
500.0000 [IU] | Freq: Once | INTRAVENOUS | Status: AC | PRN
  Administered 2020-04-12: 500 [IU]
  Filled 2020-04-12: qty 5

## 2020-04-12 MED ORDER — SODIUM CHLORIDE 0.9% FLUSH
10.0000 mL | INTRAVENOUS | Status: DC | PRN
  Administered 2020-04-12: 10 mL
  Filled 2020-04-12: qty 10

## 2020-04-12 MED ORDER — SODIUM CHLORIDE 0.9 % IV SOLN
Freq: Once | INTRAVENOUS | Status: AC
  Filled 2020-04-12: qty 250

## 2020-04-12 NOTE — Patient Instructions (Signed)
[facility] Discharge Instructions for Patients Receiving Chemotherapy  Today you received the following chemotherapy agent: Tecentriq  To help prevent nausea and vomiting after your treatment, we encourage you to take your nausea medication  as prescribed.    If you develop nausea and vomiting that is not controlled by your nausea medication, call the clinic.   BELOW ARE SYMPTOMS THAT SHOULD BE REPORTED IMMEDIATELY:  *FEVER GREATER THAN 100.5 F  *CHILLS WITH OR WITHOUT FEVER  NAUSEA AND VOMITING THAT IS NOT CONTROLLED WITH YOUR NAUSEA MEDICATION  *UNUSUAL SHORTNESS OF BREATH  *UNUSUAL BRUISING OR BLEEDING  TENDERNESS IN MOUTH AND THROAT WITH OR WITHOUT PRESENCE OF ULCERS  *URINARY PROBLEMS  *BOWEL PROBLEMS  UNUSUAL RASH Items with * indicate a potential emergency and should be followed up as soon as possible.  Feel free to call the clinic should you have any questions or concerns. The clinic phone number is [phone].  Please show the CHEMO ALERT CARD at check-in to the Emergency Department and triage nurse.   

## 2020-04-12 NOTE — Progress Notes (Signed)
Hematology and Oncology Follow Up Visit  Magic Kuhar [id number] 08-15-1950 70 y.o. 04/12/2020 11:46 AM [name], MDShadad, [name], MD   Principle Diagnosis: 36 year old with penile carcinoma diagnosed in 2015.  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   Prior Therapy: He underwent surgical resection followed by radiation therapy in 2015.  He subsequently developed metastatic disease with right-sided pelvic lymph node and confirmed the presence of relapsed disease that is PET avid in 2017.   He was treated with carboplatin and Taxol initially with excellent response after 3 cycles. 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   He did show worsening disease in October 2018 and subsequently was started on Tecentriq. He relocated to [place] and received Tecentriq maintenance therapy with excellent response. CT scan in August 2021 continues to show no evidence of disease.  Current therapy: Tecentriq 1200 mg every 3 weeks started in 2018 while living in [place].  He is currently receiving it locally started on February 09, 2020.  He presents for the next treatment.  Interim History: Mr. Carroll presents today for a follow-up visit.  Since last visit, he reports no major changes in his health.  He denies any nausea, vomiting or abdominal pain.  He denies any dermatological toxicity including skin rash or pruritus.  He remains active and continues to attempt activities of daily living.  He has established care with a primary care physician and will see urology next week.     Medications: Unchanged for review. Current Outpatient Medications  Medication Sig Dispense Refill  . amLODipine (NORVASC) 10 MG tablet Take 10 mg by mouth daily.    Marland Kitchen atorvastatin (LIPITOR) 10 MG tablet Take 10 mg by mouth daily.    . indapamide (LOZOL) 2.5 MG tablet Take 1 tablet (2.5 mg  total) by mouth daily. 90 tablet 1  . losartan (COZAAR) 100 MG tablet Take 100 mg by mouth daily.    . metoprolol tartrate (LOPRESSOR) 50 MG tablet Take 50 mg by mouth 2 (two) times daily.    . potassium gluconate 595 (99 K) MG TABS tablet Take 595 mg by mouth.    . sildenafil (VIAGRA) 50 MG tablet Take 50 mg by mouth daily as needed for erectile dysfunction.     No current facility-administered medications for this visit.   Facility-Administered Medications Ordered in Other Visits  Medication Dose Route Frequency Provider Last Rate Last Admin  . sodium chloride flush (NS) 0.9 % injection 10 mL  10 mL Intracatheter Once Wyatt Portela, MD         Allergies: No Known Allergies    Physical Exam:  Blood pressure (!) 138/56, pulse (!) 56, temperature 98 F (36.7 C), temperature source Tympanic, resp. rate 17, height 5\' 9"  (1.753 m), weight 201 lb 9.6 oz (91.4 kg), SpO2 98 %.     ECOG: 0    General appearance: Comfortable appearing without any discomfort Head: Normocephalic without any trauma Oropharynx: Mucous membranes are moist and pink without any thrush or ulcers. Eyes: Pupils are equal and round reactive to light. Lymph nodes: No cervical, supraclavicular, inguinal or axillary lymphadenopathy.   Heart:regular rate and rhythm.  S1 and S2 without leg edema. Lung: Clear without any rhonchi or wheezes.  No dullness to percussion. Abdomin: Soft, nontender, nondistended with good bowel sounds.  No hepatosplenomegaly. Musculoskeletal: No joint deformity or effusion.  Full range of motion noted. Neurological: No deficits noted  on motor, sensory and deep tendon reflex exam. Skin: No petechial rash or dryness.  Appeared moist.      Lab Results: Lab Results  Component Value Date   WBC 6.2 03/22/2020   HGB 13.4 03/22/2020   HCT 40.8 03/22/2020   MCV 84.8 03/22/2020   PLT 208 03/22/2020     Chemistry      Component Value Date/Time   NA 138 03/22/2020 0907   K 3.6  03/22/2020 0907   CL 103 03/22/2020 0907   CO2 29 03/22/2020 0907   BUN 15 03/22/2020 0907   CREATININE 1.14 03/22/2020 0907      Component Value Date/Time   CALCIUM 9.9 03/22/2020 0907   ALKPHOS 78 03/22/2020 0907   AST 17 03/22/2020 0907   ALT 18 03/22/2020 0907   BILITOT 0.6 03/22/2020 0907        Impression and Plan:  70 year old with:  1.  Stage IV high-grade urothelial carcinoma with squamous differentiation of the distal penis diagnosed in 2017 with pelvic adenopathy.  He continues to tolerate the current therapy without any new complications.  Risks and benefits of continuing this treatment long-term were reviewed.  Potential complications that include dermatological toxicity, immune mediated complications among others were reviewed.  Alternative options such as systemic chemotherapy was also discussed.  He is agreeable to continue at this time.  .2. IV access: Port-A-Cath continues to be in use without any issues.  3. Antiemetics: No nausea or vomiting reported with Compazine remains in place.  4.  Immune mediated complications.  I continue to educate him about potential complications including pneumonitis, colitis and thyroid disease.  5. Follow-up: Every 3 weeks for treatment and evaluation.   30  minutes were spent on this visit.  The time was dedicated to reviewing his disease status, addressing complications related to his cancer and cancer therapy and future plan of care discussion.   Eli Hose, MD 1/4/202211:46 AM

## 2020-05-03 ENCOUNTER — Other Ambulatory Visit: Payer: Self-pay

## 2020-05-03 ENCOUNTER — Inpatient Hospital Stay (HOSPITAL_BASED_OUTPATIENT_CLINIC_OR_DEPARTMENT_OTHER): Payer: Medicare Other | Admitting: Oncology

## 2020-05-03 ENCOUNTER — Inpatient Hospital Stay: Payer: Medicare Other

## 2020-05-03 VITALS — BP 129/72 | HR 60 | Temp 98.1°F | Resp 18 | Ht 69.0 in | Wt 201.9 lb

## 2020-05-03 DIAGNOSIS — C609 Malignant neoplasm of penis, unspecified: Secondary | ICD-10-CM | POA: Diagnosis not present

## 2020-05-03 DIAGNOSIS — Z5112 Encounter for antineoplastic immunotherapy: Secondary | ICD-10-CM | POA: Diagnosis not present

## 2020-05-03 DIAGNOSIS — C680 Malignant neoplasm of urethra: Secondary | ICD-10-CM

## 2020-05-03 DIAGNOSIS — E032 Hypothyroidism due to medicaments and other exogenous substances: Secondary | ICD-10-CM

## 2020-05-03 LAB — CBC WITH DIFFERENTIAL (CANCER CENTER ONLY)
Abs Immature Granulocytes: 0.01 10*3/uL (ref 0.00–0.07)
Basophils Absolute: 0 10*3/uL (ref 0.0–0.1)
Basophils Relative: 1 %
Eosinophils Absolute: 0.4 10*3/uL (ref 0.0–0.5)
Eosinophils Relative: 6 %
HCT: 40.1 % (ref 39.0–52.0)
Hemoglobin: 13.2 g/dL (ref 13.0–17.0)
Immature Granulocytes: 0 %
Lymphocytes Relative: 30 %
Lymphs Abs: 1.8 10*3/uL (ref 0.7–4.0)
MCH: 28.3 pg (ref 26.0–34.0)
MCHC: 32.9 g/dL (ref 30.0–36.0)
MCV: 85.9 fL (ref 80.0–100.0)
Monocytes Absolute: 0.6 10*3/uL (ref 0.1–1.0)
Monocytes Relative: 10 %
Neutro Abs: 3.3 10*3/uL (ref 1.7–7.7)
Neutrophils Relative %: 53 %
Platelet Count: 178 10*3/uL (ref 150–400)
RBC: 4.67 MIL/uL (ref 4.22–5.81)
RDW: 14.1 % (ref 11.5–15.5)
WBC Count: 6.1 10*3/uL (ref 4.0–10.5)
nRBC: 0 % (ref 0.0–0.2)

## 2020-05-03 LAB — CMP (CANCER CENTER ONLY)
ALT: 15 U/L (ref 0–44)
AST: 18 U/L (ref 15–41)
Albumin: 3.8 g/dL (ref 3.5–5.0)
Alkaline Phosphatase: 79 U/L (ref 38–126)
Anion gap: 8 (ref 5–15)
BUN: 17 mg/dL (ref 8–23)
CO2: 29 mmol/L (ref 22–32)
Calcium: 9.7 mg/dL (ref 8.9–10.3)
Chloride: 102 mmol/L (ref 98–111)
Creatinine: 1.25 mg/dL — ABNORMAL HIGH (ref 0.61–1.24)
GFR, Estimated: >60 mL/min (ref >60)
Glucose, Bld: 92 mg/dL (ref 70–99)
Potassium: 3.3 mmol/L — ABNORMAL LOW (ref 3.5–5.1)
Sodium: 139 mmol/L (ref 135–145)
Total Bilirubin: 0.8 mg/dL (ref 0.3–1.2)
Total Protein: 7.2 g/dL (ref 6.5–8.1)

## 2020-05-03 LAB — TSH: TSH: 1.319 u[IU]/mL (ref 0.320–4.118)

## 2020-05-03 MED ORDER — HEPARIN SOD (PORK) LOCK FLUSH 100 UNIT/ML IV SOLN
500.0000 [IU] | Freq: Once | INTRAVENOUS | Status: AC | PRN
  Administered 2020-05-03: 500 [IU]
  Filled 2020-05-03: qty 5

## 2020-05-03 MED ORDER — SODIUM CHLORIDE 0.9 % IV SOLN
Freq: Once | INTRAVENOUS | Status: AC
  Filled 2020-05-03: qty 250

## 2020-05-03 MED ORDER — SODIUM CHLORIDE 0.9% FLUSH
10.0000 mL | INTRAVENOUS | Status: DC | PRN
  Administered 2020-05-03: 10 mL
  Filled 2020-05-03: qty 10

## 2020-05-03 MED ORDER — SODIUM CHLORIDE 0.9 % IV SOLN
1200.0000 mg | Freq: Once | INTRAVENOUS | Status: AC
  Administered 2020-05-03: 1200 mg via INTRAVENOUS
  Filled 2020-05-03: qty 20

## 2020-05-03 NOTE — Patient Instructions (Signed)
[facility] Discharge Instructions for Patients Receiving Chemotherapy  Today you received the following chemotherapy agents: Tecentriq  To help prevent nausea and vomiting after your treatment, we encourage you to take your nausea medication as directed.   If you develop nausea and vomiting that is not controlled by your nausea medication, call the clinic.   BELOW ARE SYMPTOMS THAT SHOULD BE REPORTED IMMEDIATELY:  *FEVER GREATER THAN 100.5 F  *CHILLS WITH OR WITHOUT FEVER  NAUSEA AND VOMITING THAT IS NOT CONTROLLED WITH YOUR NAUSEA MEDICATION  *UNUSUAL SHORTNESS OF BREATH  *UNUSUAL BRUISING OR BLEEDING  TENDERNESS IN MOUTH AND THROAT WITH OR WITHOUT PRESENCE OF ULCERS  *URINARY PROBLEMS  *BOWEL PROBLEMS  UNUSUAL RASH Items with * indicate a potential emergency and should be followed up as soon as possible.  Feel free to call the clinic should you have any questions or concerns. The clinic phone number is [phone].  Please show the CHEMO ALERT CARD at check-in to the Emergency Department and triage nurse.   

## 2020-05-03 NOTE — Progress Notes (Signed)
Hematology and Oncology Follow Up Visit  Mario Hubbard [id number] April 18, 1950 70 y.o. 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  He presented with pelvic adenopathy.     Prior Therapy: He underwent surgical resection followed by radiation therapy in 2015.  He subsequently developed metastatic disease with right-sided pelvic lymph node and confirmed the presence of relapsed disease that is PET avid in 2017.   He was treated with carboplatin and Taxol initially with excellent response after 3 cycles. 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   He did show worsening disease in October 2018 and subsequently was started on Tecentriq. He relocated to [place] and received Tecentriq maintenance therapy with excellent response. CT scan in August 2021 continues to show no evidence of disease.  Current therapy: Tecentriq 1200 mg every 3 weeks started in 2018 while living in [place].  He is currently receiving it locally started on February 09, 2020.  He is here for the subsequent cycle.  Interim History: Mario Hubbard returns today for repeat evaluation.  Since the last visit, he reports no major changes in his health.  He denies any recent hospitalizations or illnesses.  He denies any nausea, vomiting or abdominal pain.  He denies any diarrhea or changes in his bowels.  His performance status and quality of life remain excellent.  He denies any hematuria or dysuria.     Medications: Reviewed and updated Current Outpatient Medications  Medication Sig Dispense Refill  . amLODipine (NORVASC) 10 MG tablet Take 10 mg by mouth daily.    Marland Kitchen atorvastatin (LIPITOR) 10 MG tablet Take 10 mg by mouth daily.    . indapamide (LOZOL) 2.5 MG tablet Take 1 tablet (2.5  mg total) by mouth daily. 90 tablet 1  . losartan (COZAAR) 100 MG tablet Take 100 mg by mouth daily.    . metoprolol tartrate (LOPRESSOR) 50 MG tablet Take 50 mg by mouth 2 (two) times daily.    . potassium gluconate 595 (99 K) MG TABS tablet Take 595 mg by mouth.    . sildenafil (VIAGRA) 50 MG tablet Take 50 mg by mouth daily as needed for erectile dysfunction.     No current facility-administered medications for this visit.     Allergies: No Known Allergies    Physical Exam:   Blood pressure 129/72, pulse 60, temperature 98.1 F (36.7 C), temperature source Tympanic, resp. rate 18, height 5\' 9"  (1.753 m), weight 201 lb 14.4 oz (91.6 kg), SpO2 96 %.     ECOG: 0    General appearance: Alert, awake without any distress. Head: Atraumatic without abnormalities Oropharynx: Without any thrush or ulcers. Eyes: No scleral icterus. Lymph nodes: No lymphadenopathy noted in the cervical, supraclavicular, or axillary nodes Heart:regular rate and rhythm, without any murmurs or gallops.   Lung: Clear to auscultation without any rhonchi, wheezes or dullness to percussion. Abdomin: Soft, nontender without any shifting dullness or ascites. Musculoskeletal: No clubbing or cyanosis. Neurological: No motor or sensory deficits. Skin: No rashes or lesions.     Lab Results: Lab Results  Component Value Date   WBC 5.7 04/12/2020   HGB 13.0 04/12/2020   HCT 39.6 04/12/2020   MCV 85.5 04/12/2020   PLT 197 04/12/2020     Chemistry      Component Value Date/Time  NA 139 04/12/2020 1148   K 3.1 (L) 04/12/2020 1148   CL 102 04/12/2020 1148   CO2 30 04/12/2020 1148   BUN 16 04/12/2020 1148   CREATININE 1.16 04/12/2020 1148      Component Value Date/Time   CALCIUM 9.6 04/12/2020 1148   ALKPHOS 77 04/12/2020 1148   AST 18 04/12/2020 1148   ALT 17 04/12/2020 1148   BILITOT 0.5 04/12/2020 1148        Impression and Plan:  70 year old with:  1.  High-grade urothelial  carcinoma with squamous differentiation of the distal penis presented with stage IV and lymphadenopathy.    He has tolerated Tecentriq without any major complications.  Imaging studies from December 2021 did not show any evidence of disease.  Risks and benefits of continuing this treatment were discussed at this time.  Repeat imaging studies will be tentatively in April 2022.  Alternative treatment options would be systemic chemotherapy if he has progression of disease.  He is agreeable with this plan.  2. IV access: Infusion is currently Port-A-Cath without any complications.  3. Antiemetics: Compazine is available to him without any recent nausea or vomiting.  4.  Immune mediated complications.  Continue to educate him about potential complications including pneumonitis, hepatitis, thyroid disease among others.  He is not experiencing any at this time.  5. Follow-up: In 3 weeks for the next cycle of therapy.   30  minutes were dedicated to this encounter.  The time was spent on reviewing disease status, discussing treatment options and future plan of care review.   Zola Button, MD 1/25/20228:11 AM

## 2020-05-16 ENCOUNTER — Telehealth: Payer: Self-pay | Admitting: Internal Medicine

## 2020-05-16 ENCOUNTER — Other Ambulatory Visit: Payer: Self-pay | Admitting: Internal Medicine

## 2020-05-16 DIAGNOSIS — E785 Hyperlipidemia, unspecified: Secondary | ICD-10-CM

## 2020-05-16 LAB — PSA: PSA: 3.17

## 2020-05-16 MED ORDER — ATORVASTATIN CALCIUM 10 MG PO TABS: 10.0000 mg | ORAL_TABLET | Freq: Every day | ORAL | 1 refills | Status: DC

## 2020-05-16 NOTE — Telephone Encounter (Signed)
1.Medication Requested: atorvastatin (LIPITOR) 10 MG tablet    2. Pharmacy (Name, Street, [place]): [id number] - [place], [place] - [address]  3. On Med List: yes   4. Last Visit with PCP: 12.27.21  5. Next visit date with PCP: n/a   Prescriber is not listed for medication, the patient was wondering if PCP would refill it.   Agent: Please be advised that RX refills may take up to 3 business days. We ask that you follow-up with your pharmacy.

## 2020-05-23 ENCOUNTER — Inpatient Hospital Stay: Payer: Medicare Other | Attending: Oncology

## 2020-05-23 ENCOUNTER — Inpatient Hospital Stay: Payer: Medicare Other

## 2020-05-23 ENCOUNTER — Other Ambulatory Visit: Payer: Self-pay

## 2020-05-23 ENCOUNTER — Inpatient Hospital Stay (HOSPITAL_BASED_OUTPATIENT_CLINIC_OR_DEPARTMENT_OTHER): Payer: Medicare Other | Admitting: Oncology

## 2020-05-23 VITALS — BP 126/70 | HR 67 | Temp 97.9°F | Resp 18 | Ht 69.0 in | Wt 201.1 lb

## 2020-05-23 DIAGNOSIS — Z95828 Presence of other vascular implants and grafts: Secondary | ICD-10-CM

## 2020-05-23 DIAGNOSIS — G629 Polyneuropathy, unspecified: Secondary | ICD-10-CM | POA: Insufficient documentation

## 2020-05-23 DIAGNOSIS — C680 Malignant neoplasm of urethra: Secondary | ICD-10-CM | POA: Diagnosis present

## 2020-05-23 DIAGNOSIS — Z79899 Other long term (current) drug therapy: Secondary | ICD-10-CM | POA: Diagnosis not present

## 2020-05-23 DIAGNOSIS — Z5112 Encounter for antineoplastic immunotherapy: Secondary | ICD-10-CM | POA: Diagnosis present

## 2020-05-23 DIAGNOSIS — C774 Secondary and unspecified malignant neoplasm of inguinal and lower limb lymph nodes: Secondary | ICD-10-CM | POA: Insufficient documentation

## 2020-05-23 DIAGNOSIS — C609 Malignant neoplasm of penis, unspecified: Secondary | ICD-10-CM | POA: Diagnosis not present

## 2020-05-23 DIAGNOSIS — E032 Hypothyroidism due to medicaments and other exogenous substances: Secondary | ICD-10-CM

## 2020-05-23 DIAGNOSIS — Z923 Personal history of irradiation: Secondary | ICD-10-CM | POA: Diagnosis not present

## 2020-05-23 LAB — CBC WITH DIFFERENTIAL (CANCER CENTER ONLY)
Abs Immature Granulocytes: 0.01 10*3/uL (ref 0.00–0.07)
Basophils Absolute: 0 10*3/uL (ref 0.0–0.1)
Basophils Relative: 0 %
Eosinophils Absolute: 0.3 10*3/uL (ref 0.0–0.5)
Eosinophils Relative: 4 %
HCT: 39.7 % (ref 39.0–52.0)
Hemoglobin: 13.1 g/dL (ref 13.0–17.0)
Immature Granulocytes: 0 %
Lymphocytes Relative: 27 %
Lymphs Abs: 1.8 10*3/uL (ref 0.7–4.0)
MCH: 28.2 pg (ref 26.0–34.0)
MCHC: 33 g/dL (ref 30.0–36.0)
MCV: 85.6 fL (ref 80.0–100.0)
Monocytes Absolute: 0.5 10*3/uL (ref 0.1–1.0)
Monocytes Relative: 8 %
Neutro Abs: 4 10*3/uL (ref 1.7–7.7)
Neutrophils Relative %: 61 %
Platelet Count: 209 10*3/uL (ref 150–400)
RBC: 4.64 MIL/uL (ref 4.22–5.81)
RDW: 14.1 % (ref 11.5–15.5)
WBC Count: 6.6 10*3/uL (ref 4.0–10.5)
nRBC: 0 % (ref 0.0–0.2)

## 2020-05-23 LAB — CMP (CANCER CENTER ONLY)
ALT: 18 U/L (ref 0–44)
AST: 18 U/L (ref 15–41)
Albumin: 4 g/dL (ref 3.5–5.0)
Alkaline Phosphatase: 77 U/L (ref 38–126)
Anion gap: 6 (ref 5–15)
BUN: 23 mg/dL (ref 8–23)
CO2: 28 mmol/L (ref 22–32)
Calcium: 9.9 mg/dL (ref 8.9–10.3)
Chloride: 105 mmol/L (ref 98–111)
Creatinine: 1.15 mg/dL (ref 0.61–1.24)
GFR, Estimated: >60 mL/min (ref >60)
Glucose, Bld: 79 mg/dL (ref 70–99)
Potassium: 3.5 mmol/L (ref 3.5–5.1)
Sodium: 139 mmol/L (ref 135–145)
Total Bilirubin: 0.4 mg/dL (ref 0.3–1.2)
Total Protein: 7.4 g/dL (ref 6.5–8.1)

## 2020-05-23 LAB — TSH: TSH: 1.089 u[IU]/mL (ref 0.320–4.118)

## 2020-05-23 MED ORDER — SODIUM CHLORIDE 0.9 % IV SOLN
Freq: Once | INTRAVENOUS | Status: AC
Start: 2020-05-23 — End: 2020-05-23
  Filled 2020-05-23: qty 250

## 2020-05-23 MED ORDER — SODIUM CHLORIDE 0.9 % IV SOLN
1200.0000 mg | Freq: Once | INTRAVENOUS | Status: AC
  Administered 2020-05-23: 1200 mg via INTRAVENOUS
  Filled 2020-05-23: qty 20

## 2020-05-23 MED ORDER — SODIUM CHLORIDE 0.9% FLUSH
10.0000 mL | Freq: Once | INTRAVENOUS | Status: AC
  Administered 2020-05-23: 10 mL
  Filled 2020-05-23: qty 10

## 2020-05-23 MED ORDER — HEPARIN SOD (PORK) LOCK FLUSH 100 UNIT/ML IV SOLN
500.0000 [IU] | Freq: Once | INTRAVENOUS | Status: AC | PRN
  Administered 2020-05-23: 500 [IU]
  Filled 2020-05-23: qty 5

## 2020-05-23 MED ORDER — SODIUM CHLORIDE 0.9% FLUSH
10.0000 mL | INTRAVENOUS | Status: DC | PRN
  Administered 2020-05-23: 10 mL
  Filled 2020-05-23: qty 10

## 2020-05-23 NOTE — Progress Notes (Signed)
Hematology and Oncology Follow Up Visit  Mario Hubbard [id number] Dec 27, 1950 70 y.o. 05/23/2020 7:47 AM Mario Hubbard Right, MDJones, Hubbard Right, MD   Principle Diagnosis: 70 year old with carcinoma of the penile urethra diagnosed in 2015.  He developed stage IV with pelvic adenopathy and poorly differentiated urothelial carcinoma with squamous differentiation.    Prior Therapy: He underwent surgical resection followed by radiation therapy in 2015.  He subsequently developed metastatic disease with right-sided pelvic lymph node and confirmed the presence of relapsed disease that is PET avid in 2017.   He was treated with carboplatin and Taxol initially with excellent response after 3 cycles. 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   He did show worsening disease in October 2018 and subsequently was started on Tecentriq. He relocated to [place] and received Tecentriq maintenance therapy with excellent response. CT scan in August 2021 continues to show no evidence of disease.  Current therapy: Tecentriq 1200 mg every 3 weeks started in 2018 while living in [place].  He is currently receiving it locally started on February 09, 2020.  He is here for the next cycle of therapy.  Interim History: Mr. Mario Hubbard presents today for a follow-up visit.  Since the last visit, he reports no major changes in his health.  He has tolerated treatment without any complaints.  He denies any nausea, fatigue or changes in his bowels.  He denies any diarrhea or abdominal discomfort.  Denies any shortness of breath or difficulty breathing.  He denies any new urinary symptoms.     Medications: Unchanged on review. Current Outpatient Medications  Medication Sig Dispense Refill  . amLODipine (NORVASC) 10 MG tablet Take 10 mg by mouth daily.    Marland Kitchen atorvastatin (LIPITOR) 10 MG tablet Take 1 tablet (10 mg total) by mouth daily. 90 tablet 1  . indapamide (LOZOL) 2.5 MG  tablet Take 1 tablet (2.5 mg total) by mouth daily. 90 tablet 1  . losartan (COZAAR) 100 MG tablet Take 100 mg by mouth daily.    . metoprolol tartrate (LOPRESSOR) 50 MG tablet Take 50 mg by mouth 2 (two) times daily.    . potassium gluconate 595 (99 K) MG TABS tablet Take 595 mg by mouth.    . sildenafil (VIAGRA) 50 MG tablet Take 50 mg by mouth daily as needed for erectile dysfunction.     No current facility-administered medications for this visit.     Allergies: No Known Allergies    Physical Exam:   Blood pressure 126/70, pulse 67, temperature 97.9 F (36.6 C), temperature source Tympanic, resp. rate 18, height 5\' 9"  (1.753 m), weight 201 lb 1.6 oz (91.2 kg), SpO2 100 %.      ECOG: 0    General appearance: Comfortable appearing without any discomfort Head: Normocephalic without any trauma Oropharynx: Mucous membranes are moist and pink without any thrush or ulcers. Eyes: Pupils are equal and round reactive to light. Lymph nodes: No cervical, supraclavicular, inguinal or axillary lymphadenopathy.   Heart:regular rate and rhythm.  S1 and S2 without leg edema. Lung: Clear without any rhonchi or wheezes.  No dullness to percussion. Abdomin: Soft, nontender, nondistended with good bowel sounds.  No hepatosplenomegaly. Musculoskeletal: No joint deformity or effusion.  Full range of motion noted. Neurological: No deficits noted on motor, sensory and deep tendon reflex exam. Skin: No petechial rash or dryness.  Appeared moist.  Psychiatric: Mood and affect appeared appropriate.       Lab Results: Lab  Results  Component Value Date   WBC 6.1 05/03/2020   HGB 13.2 05/03/2020   HCT 40.1 05/03/2020   MCV 85.9 05/03/2020   PLT 178 05/03/2020     Chemistry      Component Value Date/Time   NA 139 05/03/2020 0815   K 3.3 (L) 05/03/2020 0815   CL 102 05/03/2020 0815   CO2 29 05/03/2020 0815   BUN 17 05/03/2020 0815   CREATININE 1.25 (H) 05/03/2020 0815       Component Value Date/Time   CALCIUM 9.7 05/03/2020 0815   ALKPHOS 79 05/03/2020 0815   AST 18 05/03/2020 0815   ALT 15 05/03/2020 0815   BILITOT 0.8 05/03/2020 0815        Impression and Plan:  70 year old with:  1.  Stage IV high-grade urothelial carcinoma with squamous differentiation of the distal penis diagnosed in 2015 with lymphadenopathy.    The natural course of his disease was reviewed today and treatment options were reiterated.  He is currently on Tecentriq which he has tolerated very well.  Imaging studies obtained in December 2021 were reviewed again continues to show complete response of therapy.  Risks and benefits of continuing this treatment versus observation therapy alone were reviewed.  After discussion, he opted to continue with treatment given his excellent tolerance and overall high risk of relapse.  Different salvage therapy options will be discussed if he developed relapsed disease.  He is agreeable with this plan.  2. IV access: Port-A-Cath remains in place and diagnosed regularly.  3. Antiemetics: No nausea or vomiting reported at this time.  Compazine is available to him.  4.  Immune mediated complications.  Complications such as pneumonitis, colitis and thyroid disease were reiterated.  He is not experiencing any at this time.  5. Follow-up: He will return in 3 weeks for next cycle of therapy.   30  minutes were spent on this visit.  The time was dedicated to reviewing disease status, discussing treatment options and future plan of care review.   Zola Button, MD 2/14/20227:47 AM

## 2020-05-23 NOTE — Patient Instructions (Signed)

## 2020-05-23 NOTE — Patient Instructions (Signed)
[facility] Discharge Instructions for Patients Receiving Chemotherapy  Today you received the following chemotherapy agents: Tecentriq  To help prevent nausea and vomiting after your treatment, we encourage you to take your nausea medication as directed.   If you develop nausea and vomiting that is not controlled by your nausea medication, call the clinic.   BELOW ARE SYMPTOMS THAT SHOULD BE REPORTED IMMEDIATELY:  *FEVER GREATER THAN 100.5 F  *CHILLS WITH OR WITHOUT FEVER  NAUSEA AND VOMITING THAT IS NOT CONTROLLED WITH YOUR NAUSEA MEDICATION  *UNUSUAL SHORTNESS OF BREATH  *UNUSUAL BRUISING OR BLEEDING  TENDERNESS IN MOUTH AND THROAT WITH OR WITHOUT PRESENCE OF ULCERS  *URINARY PROBLEMS  *BOWEL PROBLEMS  UNUSUAL RASH Items with * indicate a potential emergency and should be followed up as soon as possible.  Feel free to call the clinic should you have any questions or concerns. The clinic phone number is [phone].  Please show the CHEMO ALERT CARD at check-in to the Emergency Department and triage nurse.   

## 2020-05-23 NOTE — Addendum Note (Signed)
Addended by: Wyatt Portela on: 05/23/2020 08:36 AM   Modules accepted: Orders

## 2020-05-24 ENCOUNTER — Telehealth: Payer: Self-pay | Admitting: Oncology

## 2020-05-24 NOTE — Telephone Encounter (Signed)
Scheduled per 02/14 los Patient has been called and notified

## 2020-05-24 NOTE — Telephone Encounter (Signed)
Contacted patient of upcoming apt's on 03/09. Patient is aware.

## 2020-06-15 ENCOUNTER — Inpatient Hospital Stay: Payer: Medicare Other

## 2020-06-15 ENCOUNTER — Inpatient Hospital Stay: Payer: Medicare Other | Attending: Oncology

## 2020-06-15 ENCOUNTER — Inpatient Hospital Stay (HOSPITAL_BASED_OUTPATIENT_CLINIC_OR_DEPARTMENT_OTHER): Payer: Medicare Other | Admitting: Oncology

## 2020-06-15 ENCOUNTER — Other Ambulatory Visit: Payer: Self-pay

## 2020-06-15 VITALS — BP 109/72 | HR 52 | Temp 97.3°F | Resp 16 | Ht 69.0 in | Wt 198.2 lb

## 2020-06-15 DIAGNOSIS — C774 Secondary and unspecified malignant neoplasm of inguinal and lower limb lymph nodes: Secondary | ICD-10-CM | POA: Diagnosis present

## 2020-06-15 DIAGNOSIS — Z923 Personal history of irradiation: Secondary | ICD-10-CM | POA: Insufficient documentation

## 2020-06-15 DIAGNOSIS — Z5112 Encounter for antineoplastic immunotherapy: Secondary | ICD-10-CM | POA: Diagnosis not present

## 2020-06-15 DIAGNOSIS — C680 Malignant neoplasm of urethra: Secondary | ICD-10-CM

## 2020-06-15 DIAGNOSIS — E032 Hypothyroidism due to medicaments and other exogenous substances: Secondary | ICD-10-CM

## 2020-06-15 DIAGNOSIS — Z79899 Other long term (current) drug therapy: Secondary | ICD-10-CM | POA: Insufficient documentation

## 2020-06-15 LAB — CBC WITH DIFFERENTIAL (CANCER CENTER ONLY)
Abs Immature Granulocytes: 0 10*3/uL (ref 0.00–0.07)
Basophils Absolute: 0 10*3/uL (ref 0.0–0.1)
Basophils Relative: 0 %
Eosinophils Absolute: 0.3 10*3/uL (ref 0.0–0.5)
Eosinophils Relative: 6 %
HCT: 38.6 % — ABNORMAL LOW (ref 39.0–52.0)
Hemoglobin: 12.6 g/dL — ABNORMAL LOW (ref 13.0–17.0)
Immature Granulocytes: 0 %
Lymphocytes Relative: 28 %
Lymphs Abs: 1.7 10*3/uL (ref 0.7–4.0)
MCH: 28.4 pg (ref 26.0–34.0)
MCHC: 32.6 g/dL (ref 30.0–36.0)
MCV: 86.9 fL (ref 80.0–100.0)
Monocytes Absolute: 0.5 10*3/uL (ref 0.1–1.0)
Monocytes Relative: 8 %
Neutro Abs: 3.5 10*3/uL (ref 1.7–7.7)
Neutrophils Relative %: 58 %
Platelet Count: 195 10*3/uL (ref 150–400)
RBC: 4.44 MIL/uL (ref 4.22–5.81)
RDW: 13.8 % (ref 11.5–15.5)
WBC Count: 6 10*3/uL (ref 4.0–10.5)
nRBC: 0 % (ref 0.0–0.2)

## 2020-06-15 LAB — CMP (CANCER CENTER ONLY)
ALT: 19 U/L (ref 0–44)
AST: 19 U/L (ref 15–41)
Albumin: 3.8 g/dL (ref 3.5–5.0)
Alkaline Phosphatase: 71 U/L (ref 38–126)
Anion gap: 7 (ref 5–15)
BUN: 15 mg/dL (ref 8–23)
CO2: 28 mmol/L (ref 22–32)
Calcium: 9.2 mg/dL (ref 8.9–10.3)
Chloride: 103 mmol/L (ref 98–111)
Creatinine: 1.17 mg/dL (ref 0.61–1.24)
GFR, Estimated: >60 mL/min (ref >60)
Glucose, Bld: 103 mg/dL — ABNORMAL HIGH (ref 70–99)
Potassium: 3.1 mmol/L — ABNORMAL LOW (ref 3.5–5.1)
Sodium: 138 mmol/L (ref 135–145)
Total Bilirubin: 0.6 mg/dL (ref 0.3–1.2)
Total Protein: 7 g/dL (ref 6.5–8.1)

## 2020-06-15 LAB — TSH: TSH: 1.111 u[IU]/mL (ref 0.320–4.118)

## 2020-06-15 MED ORDER — ATEZOLIZUMAB CHEMO INJECTION 1200 MG/20ML
1200.0000 mg | Freq: Once | INTRAVENOUS | Status: AC
  Administered 2020-06-15: 1200 mg via INTRAVENOUS
  Filled 2020-06-15: qty 20

## 2020-06-15 MED ORDER — SODIUM CHLORIDE 0.9% FLUSH
10.0000 mL | INTRAVENOUS | Status: DC | PRN
  Administered 2020-06-15: 10 mL
  Filled 2020-06-15: qty 10

## 2020-06-15 MED ORDER — HEPARIN SOD (PORK) LOCK FLUSH 100 UNIT/ML IV SOLN
500.0000 [IU] | Freq: Once | INTRAVENOUS | Status: AC | PRN
  Administered 2020-06-15: 500 [IU]
  Filled 2020-06-15: qty 5

## 2020-06-15 MED ORDER — SODIUM CHLORIDE 0.9 % IV SOLN
Freq: Once | INTRAVENOUS | Status: AC
  Filled 2020-06-15: qty 250

## 2020-06-15 NOTE — Patient Instructions (Signed)
[facility] Discharge Instructions for Patients Receiving Chemotherapy  Today you received the following chemotherapy agents: Tecentriq   To help prevent nausea and vomiting after your treatment, we encourage you to take your nausea medication ad directed.    If you develop nausea and vomiting that is not controlled by your nausea medication, call the clinic.   BELOW ARE SYMPTOMS THAT SHOULD BE REPORTED IMMEDIATELY:  *FEVER GREATER THAN 100.5 F  *CHILLS WITH OR WITHOUT FEVER  NAUSEA AND VOMITING THAT IS NOT CONTROLLED WITH YOUR NAUSEA MEDICATION  *UNUSUAL SHORTNESS OF BREATH  *UNUSUAL BRUISING OR BLEEDING  TENDERNESS IN MOUTH AND THROAT WITH OR WITHOUT PRESENCE OF ULCERS  *URINARY PROBLEMS  *BOWEL PROBLEMS  UNUSUAL RASH Items with * indicate a potential emergency and should be followed up as soon as possible.  Feel free to call the clinic should you have any questions or concerns. The clinic phone number is [phone].  Please show the CHEMO ALERT CARD at check-in to the Emergency Department and triage nurse.   

## 2020-06-15 NOTE — Progress Notes (Signed)
Hematology and Oncology Follow Up Visit  Mario Hubbard [id number] February 19, 1951 70 y.o. 06/15/2020 8:17 AM Mario Hubbard, MDJones, Mario Right, MD   Principle Diagnosis: 70 year old with stage IV urothelial carcinoma with squamous cell differentiation of the penile urethra diagnosed in 2015.  He was found to have left pelvic adenopathy.    Prior Therapy: He underwent surgical resection followed by radiation therapy in 2015.  He subsequently developed metastatic disease with Hubbard-sided pelvic lymph node and confirmed the presence of relapsed disease that is PET avid in 2017.   He was treated with carboplatin and Taxol initially with excellent response after 3 cycles. 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   He did show worsening disease in October 2018 and subsequently was started on Tecentriq. He relocated to [place] and received Tecentriq maintenance therapy with excellent response. CT scan in August 2021 continues to show no evidence of disease.  Current therapy: Tecentriq 1200 mg every 3 weeks started in 2018 while living in [place].  He is currently receiving it locally started on February 09, 2020.  He presents for the next cycle of therapy.  Interim History: Mario Hubbard returns today for a follow-up evaluation.  Since last visit, he reports feeling well without any complaints.  He has tolerated therapy without any recent complaints.  He denies any nausea, vomiting or skin rash.  He denies any recent hospitalizations or illnesses.  As performance status and quality of life remained excellent.  Continues to exercise regularly enjoys hiking and fishing.  Denies any diarrhea or shortness of breath.  He denies any dysuria or hematuria.     Medications: Updated on review. Current Outpatient Medications  Medication Sig Dispense Refill  . amLODipine (NORVASC) 10 MG tablet Take 10 mg by mouth daily.    Marland Kitchen atorvastatin (LIPITOR) 10 MG  tablet Take 1 tablet (10 mg total) by mouth daily. 90 tablet 1  . indapamide (LOZOL) 2.5 MG tablet Take 1 tablet (2.5 mg total) by mouth daily. 90 tablet 1  . losartan (COZAAR) 100 MG tablet Take 100 mg by mouth daily.    . metoprolol tartrate (LOPRESSOR) 50 MG tablet Take 50 mg by mouth 2 (two) times daily.    . potassium gluconate 595 (99 K) MG TABS tablet Take 595 mg by mouth.    . sildenafil (VIAGRA) 50 MG tablet Take 50 mg by mouth daily as needed for erectile dysfunction.     No current facility-administered medications for this visit.     Allergies: No Known Allergies    Physical Exam:    Blood pressure 109/72, pulse (!) 52, temperature (!) 97.3 F (36.3 C), temperature source Tympanic, resp. rate 16, height 5\' 9"  (1.753 m), weight 198 lb 3.2 oz (89.9 kg), SpO2 98 %.      ECOG: 0    General appearance: Alert, awake without any distress. Head: Atraumatic without abnormalities Oropharynx: Without any thrush or ulcers. Eyes: No scleral icterus. Lymph nodes: No lymphadenopathy noted in the cervical, supraclavicular, or axillary nodes Heart:regular rate and rhythm, without any murmurs or gallops.   Lung: Clear to auscultation without any rhonchi, wheezes or dullness to percussion. Abdomin: Soft, nontender without any shifting dullness or ascites. Musculoskeletal: No clubbing or cyanosis. Neurological: No motor or sensory deficits. Skin: No rashes or lesions.         Lab Results: Lab Results  Component Value Date   WBC 6.6 05/23/2020   HGB 13.1 05/23/2020   HCT  39.7 05/23/2020   MCV 85.6 05/23/2020   PLT 209 05/23/2020     Chemistry      Component Value Date/Time   NA 139 05/23/2020 0820   K 3.5 05/23/2020 0820   CL 105 05/23/2020 0820   CO2 28 05/23/2020 0820   BUN 23 05/23/2020 0820   CREATININE 1.15 05/23/2020 0820      Component Value Date/Time   CALCIUM 9.9 05/23/2020 0820   ALKPHOS 77 05/23/2020 0820   AST 18 05/23/2020 0820   ALT 18  05/23/2020 0820   BILITOT 0.4 05/23/2020 0820        Impression and Plan:  70 year old with:  1.  High-grade urothelial carcinoma with squamous differentiation of the distal penis diagnosed in 2015.  He developed stage IV disease with pelvic adenopathy.  He continues to tolerate Tecentriq without any major complaints.  Risks and benefits of continuing this treatment were discussed at this time.  Potential complications including immune mediated complications, infusion related issues, dermatological toxicities among others were reviewed.  CT scan obtained in December showed continued disease response.  Anticipate repeating imaging studies and June 2022.  Alternative treatment options for his cancer if he develop progression of disease including systemic chemotherapy.  He is agreeable to proceed with this plan.   2. IV access: Port-A-Cath continues to be in use without any issues.  3. Antiemetics: Compazine is available to him without any nausea or vomiting.  4.  Immune mediated complications.  I continue to reiterate these complications clinic pneumonitis, colitis and thyroid disease.  5. Follow-up: In 3 weeks for the next evaluation and follow-up   30  minutes were dedicated to this encounter.  Time spent on reviewing laboratory data, disease status update and outlining future plan of care.   Zola Button, MD 3/9/20228:17 AM

## 2020-07-05 ENCOUNTER — Inpatient Hospital Stay (HOSPITAL_BASED_OUTPATIENT_CLINIC_OR_DEPARTMENT_OTHER): Payer: Medicare Other | Admitting: Oncology

## 2020-07-05 ENCOUNTER — Inpatient Hospital Stay: Payer: Medicare Other

## 2020-07-05 ENCOUNTER — Other Ambulatory Visit: Payer: Self-pay

## 2020-07-05 VITALS — BP 122/57 | HR 55 | Temp 97.2°F | Resp 19 | Ht 69.0 in | Wt 203.2 lb

## 2020-07-05 DIAGNOSIS — Z5112 Encounter for antineoplastic immunotherapy: Secondary | ICD-10-CM | POA: Diagnosis not present

## 2020-07-05 DIAGNOSIS — C609 Malignant neoplasm of penis, unspecified: Secondary | ICD-10-CM | POA: Diagnosis not present

## 2020-07-05 DIAGNOSIS — C680 Malignant neoplasm of urethra: Secondary | ICD-10-CM

## 2020-07-05 DIAGNOSIS — E032 Hypothyroidism due to medicaments and other exogenous substances: Secondary | ICD-10-CM

## 2020-07-05 DIAGNOSIS — Z95828 Presence of other vascular implants and grafts: Secondary | ICD-10-CM

## 2020-07-05 LAB — CBC WITH DIFFERENTIAL (CANCER CENTER ONLY)
Abs Immature Granulocytes: 0.01 10*3/uL (ref 0.00–0.07)
Basophils Absolute: 0 10*3/uL (ref 0.0–0.1)
Basophils Relative: 0 %
Eosinophils Absolute: 0.4 10*3/uL (ref 0.0–0.5)
Eosinophils Relative: 6 %
HCT: 41.2 % (ref 39.0–52.0)
Hemoglobin: 13.5 g/dL (ref 13.0–17.0)
Immature Granulocytes: 0 %
Lymphocytes Relative: 29 %
Lymphs Abs: 1.7 10*3/uL (ref 0.7–4.0)
MCH: 28.3 pg (ref 26.0–34.0)
MCHC: 32.8 g/dL (ref 30.0–36.0)
MCV: 86.4 fL (ref 80.0–100.0)
Monocytes Absolute: 0.5 10*3/uL (ref 0.1–1.0)
Monocytes Relative: 9 %
Neutro Abs: 3.3 10*3/uL (ref 1.7–7.7)
Neutrophils Relative %: 56 %
Platelet Count: 198 10*3/uL (ref 150–400)
RBC: 4.77 MIL/uL (ref 4.22–5.81)
RDW: 14 % (ref 11.5–15.5)
WBC Count: 5.8 10*3/uL (ref 4.0–10.5)
nRBC: 0 % (ref 0.0–0.2)

## 2020-07-05 LAB — TSH: TSH: 2.003 u[IU]/mL (ref 0.320–4.118)

## 2020-07-05 LAB — CMP (CANCER CENTER ONLY)
ALT: 16 U/L (ref 0–44)
AST: 16 U/L (ref 15–41)
Albumin: 3.9 g/dL (ref 3.5–5.0)
Alkaline Phosphatase: 79 U/L (ref 38–126)
Anion gap: 12 (ref 5–15)
BUN: 16 mg/dL (ref 8–23)
CO2: 28 mmol/L (ref 22–32)
Calcium: 9.3 mg/dL (ref 8.9–10.3)
Chloride: 100 mmol/L (ref 98–111)
Creatinine: 1.12 mg/dL (ref 0.61–1.24)
GFR, Estimated: >60 mL/min (ref >60)
Glucose, Bld: 131 mg/dL — ABNORMAL HIGH (ref 70–99)
Potassium: 3.1 mmol/L — ABNORMAL LOW (ref 3.5–5.1)
Sodium: 140 mmol/L (ref 135–145)
Total Bilirubin: 0.5 mg/dL (ref 0.3–1.2)
Total Protein: 7.1 g/dL (ref 6.5–8.1)

## 2020-07-05 MED ORDER — SODIUM CHLORIDE 0.9 % IV SOLN
1200.0000 mg | Freq: Once | INTRAVENOUS | Status: AC
  Administered 2020-07-05: 1200 mg via INTRAVENOUS
  Filled 2020-07-05: qty 20

## 2020-07-05 MED ORDER — HEPARIN SOD (PORK) LOCK FLUSH 100 UNIT/ML IV SOLN
500.0000 [IU] | Freq: Once | INTRAVENOUS | Status: AC | PRN
  Administered 2020-07-05: 500 [IU]
  Filled 2020-07-05: qty 5

## 2020-07-05 MED ORDER — SODIUM CHLORIDE 0.9% FLUSH
10.0000 mL | INTRAVENOUS | Status: DC | PRN
  Administered 2020-07-05: 10 mL
  Filled 2020-07-05: qty 10

## 2020-07-05 MED ORDER — SODIUM CHLORIDE 0.9% FLUSH
10.0000 mL | Freq: Once | INTRAVENOUS | Status: AC
  Administered 2020-07-05: 10 mL
  Filled 2020-07-05: qty 10

## 2020-07-05 MED ORDER — SODIUM CHLORIDE 0.9 % IV SOLN
Freq: Once | INTRAVENOUS | Status: AC
  Filled 2020-07-05: qty 250

## 2020-07-05 NOTE — Progress Notes (Signed)
Hematology and Oncology Follow Up Visit  Mario Hubbard [id number] 1950-09-22 70 y.o. 07/05/2020 8:06 AM [name], MDJones, [name], MD   Principle Diagnosis: 70 year old with rothelial carcinoma with squamous cell differentiation of the penile urethra presented with pelvic adenopathy and stage IV disease diagnosed in 2015.   Prior Therapy: He underwent surgical resection followed by radiation therapy in 2015.  He subsequently developed metastatic disease with right-sided pelvic lymph node and confirmed the presence of relapsed disease that is PET avid in 2017.   He was treated with carboplatin and Taxol initially with excellent response after 3 cycles. 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   He did show worsening disease in October 2018 and subsequently was started on Tecentriq. He relocated to [place] and received Tecentriq maintenance therapy with excellent response. CT scan in August 2021 continues to show no evidence of disease.  Current therapy: Tecentriq 1200 mg every 3 weeks started in 2018 while living in [place].  He is currently receiving it locally started on February 09, 2020.  He is here for a subsequent cycle of therapy.  Interim History: Mr. Sermon presents today for a follow-up visit.  Since the last visit, he reports of feeling well without any major complaints.  He has tolerated therapy without any recent hospitalization or illnesses.  He denies any worsening neuropathy, fatigue or diarrhea.  He denies any cough, shortness of breath or difficulty breathing.  Performance status quality of life remained excellent.    Medications: Unchanged on review. Current Outpatient Medications  Medication Sig Dispense Refill  . amLODipine (NORVASC) 10 MG tablet Take 10 mg by mouth daily.    Marland Kitchen atorvastatin (LIPITOR) 10 MG tablet Take 1 tablet (10 mg total) by mouth daily. 90 tablet 1  . indapamide (LOZOL) 2.5 MG tablet  Take 1 tablet (2.5 mg total) by mouth daily. 90 tablet 1  . losartan (COZAAR) 100 MG tablet Take 100 mg by mouth daily.    . metoprolol tartrate (LOPRESSOR) 50 MG tablet Take 50 mg by mouth 2 (two) times daily.    . potassium gluconate 595 (99 K) MG TABS tablet Take 595 mg by mouth.    . sildenafil (VIAGRA) 50 MG tablet Take 50 mg by mouth daily as needed for erectile dysfunction.     No current facility-administered medications for this visit.     Allergies: No Known Allergies    Physical Exam:     Blood pressure (!) 122/57, pulse (!) 55, temperature (!) 97.2 F (36.2 C), temperature source Tympanic, resp. rate 19, height 5\' 9"  (1.753 m), weight 203 lb 3.2 oz (92.2 kg), SpO2 100 %.      ECOG: 0   General appearance: Comfortable appearing without any discomfort Head: Normocephalic without any trauma Oropharynx: Mucous membranes are moist and pink without any thrush or ulcers. Eyes: Pupils are equal and round reactive to light. Lymph nodes: No cervical, supraclavicular, inguinal or axillary lymphadenopathy.   Heart:regular rate and rhythm.  S1 and S2 without leg edema. Lung: Clear without any rhonchi or wheezes.  No dullness to percussion. Abdomin: Soft, nontender, nondistended with good bowel sounds.  No hepatosplenomegaly. Musculoskeletal: No joint deformity or effusion.  Full range of motion noted. Neurological: No deficits noted on motor, sensory and deep tendon reflex exam. Skin: No petechial rash or dryness.  Appeared moist.          Lab Results: Lab Results  Component Value Date   WBC  6.0 06/15/2020   HGB 12.6 (L) 06/15/2020   HCT 38.6 (L) 06/15/2020   MCV 86.9 06/15/2020   PLT 195 06/15/2020     Chemistry      Component Value Date/Time   NA 138 06/15/2020 0810   K 3.1 (L) 06/15/2020 0810   CL 103 06/15/2020 0810   CO2 28 06/15/2020 0810   BUN 15 06/15/2020 0810   CREATININE 1.17 06/15/2020 0810      Component Value Date/Time   CALCIUM 9.2  06/15/2020 0810   ALKPHOS 71 06/15/2020 0810   AST 19 06/15/2020 0810   ALT 19 06/15/2020 0810   BILITOT 0.6 06/15/2020 0810        Impression and Plan:  70 year old with:  1.  Stage IV high-grade urothelial carcinoma with squamous differentiation of the distal penis diagnosed in 2015.    His disease status was updated and treatment options were reiterated at this time.  He continues to tolerate Tecentriq with excellent response previously as well as overall tolerance.  Risks and benefits of this treatment were reviewed.  Complications that include immune mediated issues, dermatitis and infusion related complications were reviewed.  Alternative options such as systemic chemotherapy will be deferred unless he has progression of disease.  Plan is to update his staging scans in June 2022.  He is agreeable to proceed at this time.   2. IV access: Port-A-Cath remains in use without any problems.  3. Antiemetics: No nausea or vomiting reported at this time.  Compazine is available to him.  4.  Immune mediated complications.  No raw issues reported at this time.  Denies any pneumonitis, colitis or thyroid disease.  5. Follow-up: With the next cycle in 3 weeks.   30  minutes were spent on this visit.  Time was dedicated to reviewing laboratory data, disease status update and outlining future plan of care.   Zola Button, MD 3/29/20228:06 AM

## 2020-07-05 NOTE — Patient Instructions (Signed)
[facility] Discharge Instructions for Patients Receiving Chemotherapy  Today you received the following chemotherapy agents: Tecentriq   To help prevent nausea and vomiting after your treatment, we encourage you to take your nausea medication ad directed.    If you develop nausea and vomiting that is not controlled by your nausea medication, call the clinic.   BELOW ARE SYMPTOMS THAT SHOULD BE REPORTED IMMEDIATELY:  *FEVER GREATER THAN 100.5 F  *CHILLS WITH OR WITHOUT FEVER  NAUSEA AND VOMITING THAT IS NOT CONTROLLED WITH YOUR NAUSEA MEDICATION  *UNUSUAL SHORTNESS OF BREATH  *UNUSUAL BRUISING OR BLEEDING  TENDERNESS IN MOUTH AND THROAT WITH OR WITHOUT PRESENCE OF ULCERS  *URINARY PROBLEMS  *BOWEL PROBLEMS  UNUSUAL RASH Items with * indicate a potential emergency and should be followed up as soon as possible.  Feel free to call the clinic should you have any questions or concerns. The clinic phone number is [phone].  Please show the [facility] at check-in to the Emergency Department and triage nurse.

## 2020-07-07 ENCOUNTER — Telehealth: Payer: Self-pay | Admitting: Oncology

## 2020-07-07 NOTE — Telephone Encounter (Signed)
Scheduled per 03/29 los, patient has been called and notified of upcoming appointments.

## 2020-07-27 ENCOUNTER — Inpatient Hospital Stay: Payer: Medicare Other

## 2020-07-27 ENCOUNTER — Other Ambulatory Visit: Payer: Self-pay

## 2020-07-27 ENCOUNTER — Inpatient Hospital Stay (HOSPITAL_BASED_OUTPATIENT_CLINIC_OR_DEPARTMENT_OTHER): Payer: Medicare Other | Admitting: Oncology

## 2020-07-27 ENCOUNTER — Inpatient Hospital Stay: Payer: Medicare Other | Attending: Oncology

## 2020-07-27 VITALS — BP 128/57 | HR 58 | Temp 97.8°F | Resp 17 | Ht 69.0 in | Wt 202.7 lb

## 2020-07-27 DIAGNOSIS — C680 Malignant neoplasm of urethra: Secondary | ICD-10-CM | POA: Diagnosis present

## 2020-07-27 DIAGNOSIS — G629 Polyneuropathy, unspecified: Secondary | ICD-10-CM | POA: Insufficient documentation

## 2020-07-27 DIAGNOSIS — C774 Secondary and unspecified malignant neoplasm of inguinal and lower limb lymph nodes: Secondary | ICD-10-CM | POA: Insufficient documentation

## 2020-07-27 DIAGNOSIS — Z79899 Other long term (current) drug therapy: Secondary | ICD-10-CM | POA: Insufficient documentation

## 2020-07-27 DIAGNOSIS — E032 Hypothyroidism due to medicaments and other exogenous substances: Secondary | ICD-10-CM

## 2020-07-27 DIAGNOSIS — Z923 Personal history of irradiation: Secondary | ICD-10-CM | POA: Diagnosis not present

## 2020-07-27 DIAGNOSIS — Z5112 Encounter for antineoplastic immunotherapy: Secondary | ICD-10-CM | POA: Insufficient documentation

## 2020-07-27 DIAGNOSIS — C609 Malignant neoplasm of penis, unspecified: Secondary | ICD-10-CM

## 2020-07-27 DIAGNOSIS — Z95828 Presence of other vascular implants and grafts: Secondary | ICD-10-CM

## 2020-07-27 LAB — CBC WITH DIFFERENTIAL (CANCER CENTER ONLY)
Abs Immature Granulocytes: 0.02 10*3/uL (ref 0.00–0.07)
Basophils Absolute: 0 10*3/uL (ref 0.0–0.1)
Basophils Relative: 0 %
Eosinophils Absolute: 0.3 10*3/uL (ref 0.0–0.5)
Eosinophils Relative: 5 %
HCT: 41.1 % (ref 39.0–52.0)
Hemoglobin: 13.6 g/dL (ref 13.0–17.0)
Immature Granulocytes: 0 %
Lymphocytes Relative: 30 %
Lymphs Abs: 1.7 10*3/uL (ref 0.7–4.0)
MCH: 28.3 pg (ref 26.0–34.0)
MCHC: 33.1 g/dL (ref 30.0–36.0)
MCV: 85.6 fL (ref 80.0–100.0)
Monocytes Absolute: 0.5 10*3/uL (ref 0.1–1.0)
Monocytes Relative: 9 %
Neutro Abs: 3.2 10*3/uL (ref 1.7–7.7)
Neutrophils Relative %: 56 %
Platelet Count: 189 10*3/uL (ref 150–400)
RBC: 4.8 MIL/uL (ref 4.22–5.81)
RDW: 13.8 % (ref 11.5–15.5)
WBC Count: 5.7 10*3/uL (ref 4.0–10.5)
nRBC: 0 % (ref 0.0–0.2)

## 2020-07-27 LAB — CMP (CANCER CENTER ONLY)
ALT: 13 U/L (ref 0–44)
AST: 16 U/L (ref 15–41)
Albumin: 3.8 g/dL (ref 3.5–5.0)
Alkaline Phosphatase: 80 U/L (ref 38–126)
Anion gap: 11 (ref 5–15)
BUN: 12 mg/dL (ref 8–23)
CO2: 30 mmol/L (ref 22–32)
Calcium: 9.8 mg/dL (ref 8.9–10.3)
Chloride: 100 mmol/L (ref 98–111)
Creatinine: 1.14 mg/dL (ref 0.61–1.24)
GFR, Estimated: >60 mL/min (ref >60)
Glucose, Bld: 128 mg/dL — ABNORMAL HIGH (ref 70–99)
Potassium: 3.2 mmol/L — ABNORMAL LOW (ref 3.5–5.1)
Sodium: 141 mmol/L (ref 135–145)
Total Bilirubin: 0.4 mg/dL (ref 0.3–1.2)
Total Protein: 7 g/dL (ref 6.5–8.1)

## 2020-07-27 LAB — TSH: TSH: 2.053 u[IU]/mL (ref 0.320–4.118)

## 2020-07-27 MED ORDER — HEPARIN SOD (PORK) LOCK FLUSH 100 UNIT/ML IV SOLN
500.0000 [IU] | Freq: Once | INTRAVENOUS | Status: AC | PRN
  Administered 2020-07-27: 500 [IU]
  Filled 2020-07-27: qty 5

## 2020-07-27 MED ORDER — SODIUM CHLORIDE 0.9% FLUSH
10.0000 mL | Freq: Once | INTRAVENOUS | Status: AC
  Administered 2020-07-27: 10 mL
  Filled 2020-07-27: qty 10

## 2020-07-27 MED ORDER — SODIUM CHLORIDE 0.9 % IV SOLN
Freq: Once | INTRAVENOUS | Status: AC
  Filled 2020-07-27: qty 250

## 2020-07-27 MED ORDER — SODIUM CHLORIDE 0.9 % IV SOLN
1200.0000 mg | Freq: Once | INTRAVENOUS | Status: AC
  Administered 2020-07-27: 1200 mg via INTRAVENOUS
  Filled 2020-07-27: qty 20

## 2020-07-27 MED ORDER — SODIUM CHLORIDE 0.9% FLUSH
10.0000 mL | INTRAVENOUS | Status: DC | PRN
  Administered 2020-07-27: 10 mL
  Filled 2020-07-27: qty 10

## 2020-07-27 NOTE — Patient Instructions (Signed)
[facility] Discharge Instructions for Patients Receiving Chemotherapy  Today you received the following chemotherapy agents: Tecentriq  To help prevent nausea and vomiting after your treatment, we encourage you to take your nausea medication as directed.   If you develop nausea and vomiting that is not controlled by your nausea medication, call the clinic.   BELOW ARE SYMPTOMS THAT SHOULD BE REPORTED IMMEDIATELY:  *FEVER GREATER THAN 100.5 F  *CHILLS WITH OR WITHOUT FEVER  NAUSEA AND VOMITING THAT IS NOT CONTROLLED WITH YOUR NAUSEA MEDICATION  *UNUSUAL SHORTNESS OF BREATH  *UNUSUAL BRUISING OR BLEEDING  TENDERNESS IN MOUTH AND THROAT WITH OR WITHOUT PRESENCE OF ULCERS  *URINARY PROBLEMS  *BOWEL PROBLEMS  UNUSUAL RASH Items with * indicate a potential emergency and should be followed up as soon as possible.  Feel free to call the clinic should you have any questions or concerns. The clinic phone number is [phone].  Please show the CHEMO ALERT CARD at check-in to the Emergency Department and triage nurse.   

## 2020-07-27 NOTE — Progress Notes (Signed)
Hematology and Oncology Follow Up Visit  Mario Hubbard [id number] 27-Aug-1950 70 y.o. 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  He was found to have pelvic adenopathy and stage IV disease in 2017.  Prior Therapy: He underwent surgical resection followed by radiation therapy in 2015.  He subsequently developed metastatic disease with Hubbard-sided pelvic lymph node and confirmed the presence of relapsed disease that is PET avid in 2017.   He was treated with carboplatin and Taxol initially with excellent response after 3 cycles. 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   He did show worsening disease in October 2018 and subsequently was started on Tecentriq. He relocated to [place] and received Tecentriq maintenance therapy with excellent response. CT scan in August 2021 continues to show no evidence of disease.  Current therapy: Tecentriq 1200 mg every 3 weeks started in 2018 while living in [place].  He is currently receiving it locally started on February 09, 2020.  He is here for next cycle of therapy.   Interim History: Mario Hubbard returns today for a follow-up evaluation.  Since the last visit, he reports no major changes in his health.  He denies any nausea, vomiting or abdominal pain.  He denies any flank pain or discomfort.  Performance status quality of life remained excellent.  He denies any complications related to Tecentriq occluding pruritus, skin rash or infusion related complications.    Medications: Unchanged on review. Current Outpatient Medications  Medication Sig Dispense Refill  . amLODipine (NORVASC) 10 MG tablet Take 10 mg by mouth daily.    Marland Kitchen atorvastatin (LIPITOR) 10 MG tablet Take 1 tablet (10 mg total) by mouth daily. 90 tablet 1  . indapamide (LOZOL) 2.5 MG tablet  Take 1 tablet (2.5 mg total) by mouth daily. 90 tablet 1  . losartan (COZAAR) 100 MG tablet Take 100 mg by mouth daily.    . metoprolol tartrate (LOPRESSOR) 50 MG tablet Take 50 mg by mouth 2 (two) times daily.    . potassium gluconate 595 (99 K) MG TABS tablet Take 595 mg by mouth.    . sildenafil (VIAGRA) 50 MG tablet Take 50 mg by mouth daily as needed for erectile dysfunction.     No current facility-administered medications for this visit.     Allergies: No Known Allergies    Physical Exam:     Blood pressure (!) 128/57, pulse (!) 58, temperature 97.8 F (36.6 C), temperature source Tympanic, resp. rate 17, height 5\' 9"  (1.753 m), weight 202 lb 11.2 oz (91.9 kg), SpO2 100 %.   Blood pressure (!) 128/57, pulse (!) 58, temperature 97.8 F (36.6 C), temperature source Tympanic, resp. rate 17, height 5\' 9"  (1.753 m), weight 202 lb 11.2 oz (91.9 kg), SpO2 100 %.     ECOG: 0    General appearance: Alert, awake without any distress. Head: Atraumatic without abnormalities Oropharynx: Without any thrush or ulcers. Eyes: No scleral icterus. Lymph nodes: No lymphadenopathy noted in the cervical, supraclavicular, or axillary nodes Heart:regular rate and rhythm, without any murmurs or gallops.   Lung: Clear to auscultation without any rhonchi, wheezes or dullness to percussion. Abdomin: Soft, nontender without any shifting dullness or ascites. Musculoskeletal: No clubbing or cyanosis. Neurological: No motor or sensory deficits. Skin: No rashes or lesions.           Lab Results:  Lab Results  Component Value Date   WBC 5.7 07/27/2020   HGB 13.6 07/27/2020   HCT 41.1 07/27/2020   MCV 85.6 07/27/2020   PLT 189 07/27/2020     Chemistry      Component Value Date/Time   NA 140 07/05/2020 0829   K 3.1 (L) 07/05/2020 0829   CL 100 07/05/2020 0829   CO2 28 07/05/2020 0829   BUN 16 07/05/2020 0829   CREATININE 1.12 07/05/2020 0829      Component Value Date/Time    CALCIUM 9.3 07/05/2020 0829   ALKPHOS 79 07/05/2020 0829   AST 16 07/05/2020 0829   ALT 16 07/05/2020 0829   BILITOT 0.5 07/05/2020 0829        Impression and Plan:  70 year old with:  1.  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  He developed stage IV disease with pelvic adenopathy.  He continues to tolerate Tecentriq without any major complications.  Risks and benefits of proceeding with treatment today were were reviewed.  Alternative treatment options including systemic chemotherapy among others were reiterated.  He has tolerated treatment very well with excellent clinical response.  Plan is to continue with the same dose and schedule and update his imaging studies in June 2022.  2. IV access: Port-A-Cath currently in place without any issues.  3. Antiemetics: Compazine is available to him without any nausea or vomiting.  4.  Immune mediated complications.  I continue to review these complications.  These include pneumonitis, colitis, hepatitis, thyroid disease among others.  5. Follow-up: In 3 weeks for repeat follow-up.   30  minutes were dedicated to this encounter.  The time was spent on reviewing his disease status, discussing future treatment plans and addressing complications related to his cancer and cancer therapy.   Mario Button, MD 4/20/20228:08 AM

## 2020-08-16 ENCOUNTER — Other Ambulatory Visit: Payer: Medicare Other

## 2020-08-16 ENCOUNTER — Inpatient Hospital Stay (HOSPITAL_BASED_OUTPATIENT_CLINIC_OR_DEPARTMENT_OTHER): Payer: Medicare Other | Admitting: Oncology

## 2020-08-16 ENCOUNTER — Inpatient Hospital Stay: Payer: Medicare Other

## 2020-08-16 ENCOUNTER — Inpatient Hospital Stay: Payer: Medicare Other | Attending: Oncology

## 2020-08-16 ENCOUNTER — Other Ambulatory Visit: Payer: Self-pay

## 2020-08-16 VITALS — BP 127/74 | HR 50 | Temp 97.4°F | Resp 17 | Wt 202.1 lb

## 2020-08-16 DIAGNOSIS — Z79899 Other long term (current) drug therapy: Secondary | ICD-10-CM | POA: Diagnosis not present

## 2020-08-16 DIAGNOSIS — Z95828 Presence of other vascular implants and grafts: Secondary | ICD-10-CM

## 2020-08-16 DIAGNOSIS — C774 Secondary and unspecified malignant neoplasm of inguinal and lower limb lymph nodes: Secondary | ICD-10-CM | POA: Diagnosis present

## 2020-08-16 DIAGNOSIS — Z5112 Encounter for antineoplastic immunotherapy: Secondary | ICD-10-CM | POA: Insufficient documentation

## 2020-08-16 DIAGNOSIS — C680 Malignant neoplasm of urethra: Secondary | ICD-10-CM | POA: Diagnosis present

## 2020-08-16 DIAGNOSIS — E032 Hypothyroidism due to medicaments and other exogenous substances: Secondary | ICD-10-CM

## 2020-08-16 DIAGNOSIS — C609 Malignant neoplasm of penis, unspecified: Secondary | ICD-10-CM

## 2020-08-16 DIAGNOSIS — Z923 Personal history of irradiation: Secondary | ICD-10-CM | POA: Diagnosis not present

## 2020-08-16 LAB — CMP (CANCER CENTER ONLY)
ALT: 12 U/L (ref 0–44)
AST: 17 U/L (ref 15–41)
Albumin: 3.9 g/dL (ref 3.5–5.0)
Alkaline Phosphatase: 78 U/L (ref 38–126)
Anion gap: 8 (ref 5–15)
BUN: 19 mg/dL (ref 8–23)
CO2: 29 mmol/L (ref 22–32)
Calcium: 9.7 mg/dL (ref 8.9–10.3)
Chloride: 102 mmol/L (ref 98–111)
Creatinine: 1.14 mg/dL (ref 0.61–1.24)
GFR, Estimated: >60 mL/min (ref >60)
Glucose, Bld: 99 mg/dL (ref 70–99)
Potassium: 3.4 mmol/L — ABNORMAL LOW (ref 3.5–5.1)
Sodium: 139 mmol/L (ref 135–145)
Total Bilirubin: 0.4 mg/dL (ref 0.3–1.2)
Total Protein: 7.3 g/dL (ref 6.5–8.1)

## 2020-08-16 LAB — CBC WITH DIFFERENTIAL (CANCER CENTER ONLY)
Abs Immature Granulocytes: 0.01 10*3/uL (ref 0.00–0.07)
Basophils Absolute: 0 10*3/uL (ref 0.0–0.1)
Basophils Relative: 1 %
Eosinophils Absolute: 0.3 10*3/uL (ref 0.0–0.5)
Eosinophils Relative: 5 %
HCT: 41.3 % (ref 39.0–52.0)
Hemoglobin: 13.6 g/dL (ref 13.0–17.0)
Immature Granulocytes: 0 %
Lymphocytes Relative: 30 %
Lymphs Abs: 1.8 10*3/uL (ref 0.7–4.0)
MCH: 28.3 pg (ref 26.0–34.0)
MCHC: 32.9 g/dL (ref 30.0–36.0)
MCV: 86 fL (ref 80.0–100.0)
Monocytes Absolute: 0.6 10*3/uL (ref 0.1–1.0)
Monocytes Relative: 9 %
Neutro Abs: 3.4 10*3/uL (ref 1.7–7.7)
Neutrophils Relative %: 55 %
Platelet Count: 189 10*3/uL (ref 150–400)
RBC: 4.8 MIL/uL (ref 4.22–5.81)
RDW: 13.8 % (ref 11.5–15.5)
WBC Count: 6.1 10*3/uL (ref 4.0–10.5)
nRBC: 0 % (ref 0.0–0.2)

## 2020-08-16 LAB — TSH: TSH: 1.907 u[IU]/mL (ref 0.320–4.118)

## 2020-08-16 MED ORDER — SODIUM CHLORIDE 0.9 % IV SOLN
1200.0000 mg | Freq: Once | INTRAVENOUS | Status: AC
  Administered 2020-08-16: 1200 mg via INTRAVENOUS
  Filled 2020-08-16: qty 20

## 2020-08-16 MED ORDER — SODIUM CHLORIDE 0.9% FLUSH
10.0000 mL | Freq: Once | INTRAVENOUS | Status: AC
Start: 2020-08-16 — End: 2020-08-16
  Administered 2020-08-16: 10 mL
  Filled 2020-08-16: qty 10

## 2020-08-16 MED ORDER — SODIUM CHLORIDE 0.9% FLUSH
10.0000 mL | INTRAVENOUS | Status: DC | PRN
  Administered 2020-08-16: 10 mL
  Filled 2020-08-16: qty 10

## 2020-08-16 MED ORDER — SODIUM CHLORIDE 0.9 % IV SOLN
Freq: Once | INTRAVENOUS | Status: AC
  Filled 2020-08-16: qty 250

## 2020-08-16 MED ORDER — HEPARIN SOD (PORK) LOCK FLUSH 100 UNIT/ML IV SOLN
500.0000 [IU] | Freq: Once | INTRAVENOUS | Status: AC | PRN
  Administered 2020-08-16: 500 [IU]
  Filled 2020-08-16: qty 5

## 2020-08-16 NOTE — Patient Instructions (Signed)
[facility] MEDICAL ONCOLOGY  Discharge Instructions: ?Thank you for choosing [facility] to provide your oncology and hematology care.  ? ?If you have a lab appointment with the Cancer Center, please go directly to the Cancer Center and check in at the registration area. ?  ?Wear comfortable clothing and clothing appropriate for easy access to any Portacath or PICC line.  ? ?We strive to give you quality time with your provider. You may need to reschedule your appointment if you arrive late (15 or more minutes).  Arriving late affects you and other patients whose appointments are after yours.  Also, if you miss three or more appointments without notifying the office, you may be dismissed from the clinic at the provider?s discretion.    ?  ?For prescription refill requests, have your pharmacy contact our office and allow 72 hours for refills to be completed.   ? ?Today you received the following chemotherapy and/or immunotherapy agents: Atezolizumab (Tecentriq)   ?  ?To help prevent nausea and vomiting after your treatment, we encourage you to take your nausea medication as directed. ? ?BELOW ARE SYMPTOMS THAT SHOULD BE REPORTED IMMEDIATELY: ?*FEVER GREATER THAN 100.4 F (38 ?C) OR HIGHER ?*CHILLS OR SWEATING ?*NAUSEA AND VOMITING THAT IS NOT CONTROLLED WITH YOUR NAUSEA MEDICATION ?*UNUSUAL SHORTNESS OF BREATH ?*UNUSUAL BRUISING OR BLEEDING ?*URINARY PROBLEMS (pain or burning when urinating, or frequent urination) ?*BOWEL PROBLEMS (unusual diarrhea, constipation, pain near the anus) ?TENDERNESS IN MOUTH AND THROAT WITH OR WITHOUT PRESENCE OF ULCERS (sore throat, sores in mouth, or a toothache) ?UNUSUAL RASH, SWELLING OR PAIN  ?UNUSUAL VAGINAL DISCHARGE OR ITCHING  ? ?Items with * indicate a potential emergency and should be followed up as soon as possible or go to the Emergency Department if any problems should occur. ? ?Please show the CHEMOTHERAPY ALERT CARD or IMMUNOTHERAPY ALERT CARD  at check-in to the Emergency Department and triage nurse. ? ?Should you have questions after your visit or need to cancel or reschedule your appointment, please contact [facility]  Dept: [phone]  and follow the prompts.  Office hours are 8:00 a.m. to 4:30 p.m. Monday - Friday. Please note that voicemails left after 4:00 p.m. may not be returned until the following business day.  We are closed weekends and major holidays. You have access to a nurse at all times for urgent questions. Please call the main number to the clinic Dept: [phone] and follow the prompts. ? ? ?For any non-urgent questions, you may also contact your provider using MyChart. We now offer e-Visits for anyone 18 and older to request care online for non-urgent symptoms. For details visit [email or web address]. ?  ?Also download the MyChart app! Go to the app store, search "MyChart", open the app, select [facility], and log in with your MyChart username and password. ? ?Due to Covid, a mask is required upon entering the hospital/clinic. If you do not have a mask, one will be given to you upon arrival. For doctor visits, patients may have 1 support person aged 18 or older with them. For treatment visits, patients cannot have anyone with them due to current Covid guidelines and our immunocompromised population.  ? ?

## 2020-08-16 NOTE — Patient Instructions (Signed)

## 2020-08-16 NOTE — Progress Notes (Signed)
Hematology and Oncology Follow Up Visit  Mario Hubbard [id number] [birth year] 70 y.o. 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   Prior Therapy: He underwent surgical resection followed by radiation therapy in 2015.  He subsequently developed metastatic disease with right-sided pelvic lymph node and confirmed the presence of relapsed disease that is PET avid in 2017.   He was treated with carboplatin and Taxol initially with excellent response after 3 cycles. 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   He did show worsening disease in October 2018 and subsequently was started on Tecentriq. He relocated to [place] and received Tecentriq maintenance therapy with excellent response. CT scan in August 2021 continues to show no evidence of disease.  Current therapy: Tecentriq 1200 mg every 3 weeks started in 2018 while living in [place].  He is currently receiving it locally started on February 09, 2020.  He is here for next cycle of therapy.   Interim History: Mario Hubbard is here for a follow-up evaluation.  Since the last visit, he reports no major changes in his health.  He continues to tolerate Tecentriq without any complications.  He denies any nausea, vomiting or abdominal pain.  He denies any respiratory issues or difficulty breathing.  He denies any skin rash or pruritus.  He denies any urinary frequency or urgency or hesitancy.    Medications: Updated on review. Current Outpatient Medications  Medication Sig Dispense Refill  . amLODipine (NORVASC) 10 MG tablet Take 10 mg by mouth daily.    Marland Kitchen atorvastatin (LIPITOR) 10 MG tablet Take 1 tablet (10 mg total) by mouth daily. 90 tablet 1  . indapamide (LOZOL) 2.5 MG tablet Take 1 tablet (2.5 mg total) by mouth daily. 90  tablet 1  . losartan (COZAAR) 100 MG tablet Take 100 mg by mouth daily.    . metoprolol tartrate (LOPRESSOR) 50 MG tablet Take 50 mg by mouth 2 (two) times daily.    . potassium gluconate 595 (99 K) MG TABS tablet Take 595 mg by mouth.    . sildenafil (VIAGRA) 50 MG tablet Take 50 mg by mouth daily as needed for erectile dysfunction.     No current facility-administered medications for this visit.     Allergies: No Known Allergies    Physical Exam:  Blood pressure 127/74, pulse (!) 50, temperature (!) 97.4 F (36.3 C), temperature source Tympanic, resp. rate 17, weight 202 lb 1.6 oz (91.7 kg), SpO2 98 %.        ECOG: 0   General appearance: Comfortable appearing without any discomfort Head: Normocephalic without any trauma Oropharynx: Mucous membranes are moist and pink without any thrush or ulcers. Eyes: Pupils are equal and round reactive to light. Lymph nodes: No cervical, supraclavicular, inguinal or axillary lymphadenopathy.   Heart:regular rate and rhythm.  S1 and S2 without leg edema. Lung: Clear without any rhonchi or wheezes.  No dullness to percussion. Abdomin: Soft, nontender, nondistended with good bowel sounds.  No hepatosplenomegaly. Musculoskeletal: No joint deformity or effusion.  Full range of motion noted. Neurological: No deficits noted on motor, sensory and deep tendon reflex exam. Skin: No petechial rash or dryness.  Appeared moist.             Lab Results: Lab Results  Component Value Date   WBC 5.7 07/27/2020  HGB 13.6 07/27/2020   HCT 41.1 07/27/2020   MCV 85.6 07/27/2020   PLT 189 07/27/2020     Chemistry      Component Value Date/Time   NA 141 07/27/2020 0755   K 3.2 (L) 07/27/2020 0755   CL 100 07/27/2020 0755   CO2 30 07/27/2020 0755   BUN 12 07/27/2020 0755   CREATININE 1.14 07/27/2020 0755      Component Value Date/Time   CALCIUM 9.8 07/27/2020 0755   ALKPHOS 80 07/27/2020 0755   AST 16 07/27/2020 0755   ALT 13  07/27/2020 0755   BILITOT 0.4 07/27/2020 0755        Impression and Plan:  70 year old with:  1.  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    His disease status was updated at this time and treatment options were reviewed.  He continues to tolerate Tecentriq without any major complaints.  Risks and benefits of continuing this treatment were reiterated.  Alternative treatment options including systemic chemotherapy was also discussed.  He is agreeable to continue at this time and we will update his staging scans after the next infusion.  He is agreeable to proceed at this time.  2. IV access: Port-A-Cath remains in use without any issues.  3. Antiemetics: No nausea or vomiting reported at this time.  Compazine is available to him.  4.  Immune mediated complications.  These include pneumonitis, colitis, hypophysitis and hepatitis among others.  I continue to educate him about potential complications and so far he has not experienced any.  5. Follow-up: He will return in 3 weeks for repeat evaluation.   30  minutes were spent on this visit.  The time was dedicated to reviewing his disease status, discussing treatment options and complications related to his cancer and cancer therapy.   Zola Button, MD 5/10/20228:40 AM

## 2020-09-07 ENCOUNTER — Inpatient Hospital Stay (HOSPITAL_BASED_OUTPATIENT_CLINIC_OR_DEPARTMENT_OTHER): Payer: Medicare Other | Admitting: Oncology

## 2020-09-07 ENCOUNTER — Inpatient Hospital Stay: Payer: Medicare Other

## 2020-09-07 ENCOUNTER — Other Ambulatory Visit: Payer: Self-pay

## 2020-09-07 ENCOUNTER — Inpatient Hospital Stay: Payer: Medicare Other | Attending: Oncology

## 2020-09-07 VITALS — HR 56

## 2020-09-07 VITALS — BP 131/68 | HR 50 | Temp 97.4°F | Resp 19 | Ht 69.0 in | Wt 202.2 lb

## 2020-09-07 DIAGNOSIS — Z5112 Encounter for antineoplastic immunotherapy: Secondary | ICD-10-CM | POA: Diagnosis not present

## 2020-09-07 DIAGNOSIS — C680 Malignant neoplasm of urethra: Secondary | ICD-10-CM

## 2020-09-07 DIAGNOSIS — Z79899 Other long term (current) drug therapy: Secondary | ICD-10-CM | POA: Diagnosis not present

## 2020-09-07 DIAGNOSIS — C609 Malignant neoplasm of penis, unspecified: Secondary | ICD-10-CM

## 2020-09-07 DIAGNOSIS — N400 Enlarged prostate without lower urinary tract symptoms: Secondary | ICD-10-CM | POA: Diagnosis not present

## 2020-09-07 DIAGNOSIS — C774 Secondary and unspecified malignant neoplasm of inguinal and lower limb lymph nodes: Secondary | ICD-10-CM | POA: Insufficient documentation

## 2020-09-07 DIAGNOSIS — Z923 Personal history of irradiation: Secondary | ICD-10-CM | POA: Insufficient documentation

## 2020-09-07 DIAGNOSIS — E032 Hypothyroidism due to medicaments and other exogenous substances: Secondary | ICD-10-CM

## 2020-09-07 DIAGNOSIS — E079 Disorder of thyroid, unspecified: Secondary | ICD-10-CM | POA: Diagnosis not present

## 2020-09-07 DIAGNOSIS — Z95828 Presence of other vascular implants and grafts: Secondary | ICD-10-CM

## 2020-09-07 LAB — CMP (CANCER CENTER ONLY)
ALT: 18 U/L (ref 0–44)
AST: 26 U/L (ref 15–41)
Albumin: 3.8 g/dL (ref 3.5–5.0)
Alkaline Phosphatase: 73 U/L (ref 38–126)
Anion gap: 12 (ref 5–15)
BUN: 18 mg/dL (ref 8–23)
CO2: 27 mmol/L (ref 22–32)
Calcium: 9.8 mg/dL (ref 8.9–10.3)
Chloride: 103 mmol/L (ref 98–111)
Creatinine: 1.12 mg/dL (ref 0.61–1.24)
GFR, Estimated: >60 mL/min (ref >60)
Glucose, Bld: 92 mg/dL (ref 70–99)
Potassium: 3.4 mmol/L — ABNORMAL LOW (ref 3.5–5.1)
Sodium: 142 mmol/L (ref 135–145)
Total Bilirubin: 0.4 mg/dL (ref 0.3–1.2)
Total Protein: 7.2 g/dL (ref 6.5–8.1)

## 2020-09-07 LAB — CBC WITH DIFFERENTIAL (CANCER CENTER ONLY)
Abs Immature Granulocytes: 0.01 10*3/uL (ref 0.00–0.07)
Basophils Absolute: 0 10*3/uL (ref 0.0–0.1)
Basophils Relative: 1 %
Eosinophils Absolute: 0.4 10*3/uL (ref 0.0–0.5)
Eosinophils Relative: 6 %
HCT: 39.4 % (ref 39.0–52.0)
Hemoglobin: 13.2 g/dL (ref 13.0–17.0)
Immature Granulocytes: 0 %
Lymphocytes Relative: 33 %
Lymphs Abs: 1.9 10*3/uL (ref 0.7–4.0)
MCH: 28.3 pg (ref 26.0–34.0)
MCHC: 33.5 g/dL (ref 30.0–36.0)
MCV: 84.5 fL (ref 80.0–100.0)
Monocytes Absolute: 0.5 10*3/uL (ref 0.1–1.0)
Monocytes Relative: 8 %
Neutro Abs: 3.1 10*3/uL (ref 1.7–7.7)
Neutrophils Relative %: 52 %
Platelet Count: 187 10*3/uL (ref 150–400)
RBC: 4.66 MIL/uL (ref 4.22–5.81)
RDW: 13.9 % (ref 11.5–15.5)
WBC Count: 5.9 10*3/uL (ref 4.0–10.5)
nRBC: 0 % (ref 0.0–0.2)

## 2020-09-07 LAB — TSH: TSH: 2.585 u[IU]/mL (ref 0.320–4.118)

## 2020-09-07 MED ORDER — SODIUM CHLORIDE 0.9 % IV SOLN
Freq: Once | INTRAVENOUS | Status: AC
Start: 2020-09-07 — End: 2020-09-07
  Filled 2020-09-07: qty 250

## 2020-09-07 MED ORDER — HEPARIN SOD (PORK) LOCK FLUSH 100 UNIT/ML IV SOLN
500.0000 [IU] | Freq: Once | INTRAVENOUS | Status: AC | PRN
  Administered 2020-09-07: 500 [IU]
  Filled 2020-09-07: qty 5

## 2020-09-07 MED ORDER — SODIUM CHLORIDE 0.9 % IV SOLN
1200.0000 mg | Freq: Once | INTRAVENOUS | Status: AC
  Administered 2020-09-07: 1200 mg via INTRAVENOUS
  Filled 2020-09-07: qty 20

## 2020-09-07 MED ORDER — SODIUM CHLORIDE 0.9% FLUSH
10.0000 mL | INTRAVENOUS | Status: DC | PRN
  Administered 2020-09-07: 10 mL
  Filled 2020-09-07: qty 10

## 2020-09-07 MED ORDER — SODIUM CHLORIDE 0.9% FLUSH
10.0000 mL | Freq: Once | INTRAVENOUS | Status: AC
  Administered 2020-09-07: 10 mL
  Filled 2020-09-07: qty 10

## 2020-09-07 NOTE — Patient Instructions (Signed)
[facility]COLOG[facility]  Discharge Instructions: ?Thank you for choosing [facility] to provide your oncology and hematology care.  ? ?If you have a lab appointment with the Cancer Center, please go directly to the Cancer Center and check in at the registration area. ?  ?Wear comfortable clothing and clothing appropriate for easy access to any Portacath or PICC line.  ? ?We strive to give you quality time with your provider. You may need to reschedule your appointment if you arrive late (15 or more minutes).  Arriving late affects you and other patients whose appointments are after yours.  Also, if you miss three or more appointments without notifying the office, you may be dismissed from the clinic at the provider?s discretion.    ?  ?For prescription refill requests, have your pharmacy contact our office and allow 72 hours for refills to be completed.   ? ?Today you received the following chemotherapy and/or immunotherapy agents: Atezolizumab (Tecentriq)   ?  ?To help prevent nausea and vomiting after your treatment, we encourage you to take your nausea medication as directed. ? ?BELOW ARE SYMPTOMS THAT SHOULD BE REPORTED IMMEDIATELY: ?*FEVER GREATER THAN 100.4 F (38 ?C) OR HIGHER ?*CHILLS OR SWEATING ?*NAUSEA AND VOMITING THAT IS NOT CONTROLLED WITH YOUR NAUSEA MEDICATION ?*UNUSUAL SHORTNESS OF BREATH ?*UNUSUAL BRUISING OR BLEEDING ?*URINARY PROBLEMS (pain or burning when urinating, or frequent urination) ?*BOWEL PROBLEMS (unusual diarrhea, constipation, pain near the anus) ?TENDERNESS IN MOUTH AND THROAT WITH OR WITHOUT PRESENCE OF ULCERS (sore throat, sores in mouth, or a toothache) ?UNUSUAL RASH, SWELLING OR PAIN  ?UNUSUAL VAGINAL DISCHARGE OR ITCHING  ? ?Items with * indicate a potential emergency and should be followed up as soon as possible or go to the Emergency Department if any problems should occur. ? ?Please show the CHEMOTHERAPY ALERT CARD or IMMUNOTHERAPY ALERT CARD  at check-in to the Emergency Department and triage nurse. ? ?Should you have questions after your visit or need to cancel or reschedule your appointment, please contact [facility]COL[facility]  Dept: [phone]  and follow the prompts.  Office hours are 8:00 a.m. to 4:30 p.m. Monday - Friday. Please note that voicemails left after 4:00 p.m. may not be returned until the following business day.  We are closed weekends and major holidays. You have access to a nurse at all times for urgent questions. Please call the main number to the clinic Dept: [phone] and follow the prompts. ? ? ?For any non-urgent questions, you may also contact your provider using MyChart. We now offer e-Visits for anyone 18 and older to request care online for non-urgent symptoms. For details visit [email or web address]. ?  ?Also download the MyChart app! Go to the app store, search "MyChart", open the app, select [facility], and log in with your MyChart username and password. ? ?Due to Covid, a mask is required upon entering the hospital/clinic. If you do not have a mask, one will be given to you upon arrival. For doctor visits, patients may have 1 support person aged 18 or older with them. For treatment visits, patients cannot have anyone with them due to current Covid guidelines and our immunocompromised population.  ? ?

## 2020-09-07 NOTE — Progress Notes (Signed)
Hematology and Oncology Follow Up Visit  Mario Hubbard [id number] 28-Dec-1950 70 y.o. 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  He presented with stage IV disease and pelvic adenopathy.   Prior Therapy: He underwent surgical resection followed by radiation therapy in 2015.  He subsequently developed metastatic disease with Hubbard-sided pelvic lymph node and confirmed the presence of relapsed disease that is PET avid in 2017.   He was treated with carboplatin and Taxol initially with excellent response after 3 cycles. 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   He did show worsening disease in October 2018 and subsequently was started on Tecentriq. He relocated to [place] and received Tecentriq maintenance therapy with excellent response. CT scan in August 2021 continues to show no evidence of disease.  Current therapy: Tecentriq 1200 mg every 3 weeks started in 2018 while living in [place].  He is currently receiving it locally started on February 09, 2020.  He he presents for the subsequent infusion.   Interim History: Mario Hubbard is here for a follow-up visit.  Since his last treatment, he reports no major changes in his health.  He continues to tolerate the current treatment without any complaints.  He denies any nausea vomiting or abdominal pain.  He denies any hematuria or dysuria.  He denies any flank pain or discomfort.  He denies any skin rash or lesions.  He denies any shortness of breath or changes in bowels.    Medications: Reviewed without any changes. Current Outpatient Medications  Medication Sig Dispense Refill  . amLODipine (NORVASC) 10 MG tablet Take 10 mg by mouth daily.    Marland Kitchen atorvastatin (LIPITOR) 10 MG tablet Take 1 tablet (10 mg total) by  mouth daily. 90 tablet 1  . indapamide (LOZOL) 2.5 MG tablet Take 1 tablet (2.5 mg total) by mouth daily. 90 tablet 1  . losartan (COZAAR) 100 MG tablet Take 100 mg by mouth daily.    . metoprolol tartrate (LOPRESSOR) 50 MG tablet Take 50 mg by mouth 2 (two) times daily.    . potassium gluconate 595 (99 K) MG TABS tablet Take 595 mg by mouth.    . sildenafil (VIAGRA) 50 MG tablet Take 50 mg by mouth daily as needed for erectile dysfunction.     No current facility-administered medications for this visit.   Facility-Administered Medications Ordered in Other Visits  Medication Dose Route Frequency Provider Last Rate Last Admin  . sodium chloride flush (NS) 0.9 % injection 10 mL  10 mL Intracatheter Once Wyatt Portela, MD         Allergies: No Known Allergies    Physical Exam:     Blood pressure 131/68, pulse (!) 50, temperature (!) 97.4 F (36.3 C), temperature source Tympanic, resp. rate 19, height 5\' 9"  (1.753 m), weight 202 lb 3.2 oz (91.7 kg), SpO2 100 %.      ECOG: 0    General appearance: Alert, awake without any distress. Head: Atraumatic without abnormalities Oropharynx: Without any thrush or ulcers. Eyes: No scleral icterus. Lymph nodes: No lymphadenopathy noted in the cervical, supraclavicular, or axillary nodes Heart:regular rate and rhythm, without any murmurs or gallops.   Lung: Clear to auscultation without any rhonchi, wheezes or dullness to percussion. Abdomin: Soft, nontender without any shifting dullness or ascites. Musculoskeletal: No clubbing or cyanosis. Neurological:  No motor or sensory deficits. Skin: No rashes or lesions.             Lab Results: Lab Results  Component Value Date   WBC 6.1 08/16/2020   HGB 13.6 08/16/2020   HCT 41.3 08/16/2020   MCV 86.0 08/16/2020   PLT 189 08/16/2020     Chemistry      Component Value Date/Time   NA 139 08/16/2020 0903   K 3.4 (L) 08/16/2020 0903   CL 102 08/16/2020 0903   CO2 29  08/16/2020 0903   BUN 19 08/16/2020 0903   CREATININE 1.14 08/16/2020 0903      Component Value Date/Time   CALCIUM 9.7 08/16/2020 0903   ALKPHOS 78 08/16/2020 0903   AST 17 08/16/2020 0903   ALT 12 08/16/2020 0903   BILITOT 0.4 08/16/2020 0903        Impression and Plan:  69 year old with:  1.  Advanced urothelial carcinoma with squamous differentiation involving pelvic adenopathy indicating stage IV disease since 2017.  His primary was detected arising from the penile urethra in 2015.  He continues to be on Tecentriq without any major complications.  Risks and benefits of continuing this treatment long-term were reviewed.  Complications that include nausea, fatigue, dermatitis and autoimmune complications were reiterated.  Plan is to update his staging scans before the next visit.  As long as he is having an excellent response to therapy we will continue with this treatment.  Potential therapy discontinuation can be considered at some point if he continues to have.  He is agreeable to proceed with this plan.  2. IV access: Port-A-Cath currently in use without any complications or issues.  3. Antiemetics: Compazine is available to him without any nausea or vomiting.  4.  Immune mediated complications.  Not reported at this time.  I continue to educate him about pneumonitis, colitis, thyroid disease among many others.  5. Follow-up: In 3 weeks for repeat evaluation.   30  minutes were dedicated to this encounter.  Time spent on reviewing disease status, laboratory data review and outlining future plan of care and answering questions regarding complications to therapy.   Zola Button, MD 6/1/20227:56 AM

## 2020-09-14 ENCOUNTER — Telehealth: Payer: Self-pay | Admitting: Oncology

## 2020-09-14 NOTE — Telephone Encounter (Signed)
Scheduled per los, patient has been called and notified of upcoming appointments. 

## 2020-09-21 ENCOUNTER — Other Ambulatory Visit: Payer: Self-pay

## 2020-09-21 ENCOUNTER — Ambulatory Visit (HOSPITAL_COMMUNITY)
Admission: RE | Admit: 2020-09-21 | Discharge: 2020-09-21 | Disposition: A | Discharge status: Home / Self Care | Payer: Medicare Other | Source: Ambulatory Visit | Attending: Oncology | Admitting: Oncology

## 2020-09-21 ENCOUNTER — Encounter (HOSPITAL_COMMUNITY): Payer: Self-pay

## 2020-09-21 DIAGNOSIS — C680 Malignant neoplasm of urethra: Secondary | ICD-10-CM | POA: Diagnosis present

## 2020-09-27 ENCOUNTER — Ambulatory Visit (INDEPENDENT_AMBULATORY_CARE_PROVIDER_SITE_OTHER): Payer: Medicare Other | Admitting: Internal Medicine

## 2020-09-27 ENCOUNTER — Inpatient Hospital Stay: Payer: Medicare Other

## 2020-09-27 ENCOUNTER — Inpatient Hospital Stay (HOSPITAL_BASED_OUTPATIENT_CLINIC_OR_DEPARTMENT_OTHER): Payer: Medicare Other | Admitting: Oncology

## 2020-09-27 ENCOUNTER — Encounter: Payer: Self-pay | Admitting: Internal Medicine

## 2020-09-27 ENCOUNTER — Other Ambulatory Visit: Payer: Self-pay

## 2020-09-27 VITALS — BP 136/76 | HR 65 | Temp 98.5°F | Resp 16 | Ht 69.0 in | Wt 203.0 lb

## 2020-09-27 VITALS — BP 125/68 | HR 51 | Temp 97.0°F | Resp 20 | Ht 69.0 in | Wt 201.6 lb

## 2020-09-27 DIAGNOSIS — C609 Malignant neoplasm of penis, unspecified: Secondary | ICD-10-CM

## 2020-09-27 DIAGNOSIS — Z5112 Encounter for antineoplastic immunotherapy: Secondary | ICD-10-CM | POA: Diagnosis not present

## 2020-09-27 DIAGNOSIS — N401 Enlarged prostate with lower urinary tract symptoms: Secondary | ICD-10-CM | POA: Insufficient documentation

## 2020-09-27 DIAGNOSIS — E876 Hypokalemia: Secondary | ICD-10-CM

## 2020-09-27 DIAGNOSIS — C680 Malignant neoplasm of urethra: Secondary | ICD-10-CM

## 2020-09-27 DIAGNOSIS — E785 Hyperlipidemia, unspecified: Secondary | ICD-10-CM | POA: Diagnosis not present

## 2020-09-27 DIAGNOSIS — I10 Essential (primary) hypertension: Secondary | ICD-10-CM | POA: Diagnosis not present

## 2020-09-27 DIAGNOSIS — E032 Hypothyroidism due to medicaments and other exogenous substances: Secondary | ICD-10-CM

## 2020-09-27 DIAGNOSIS — K635 Polyp of colon: Secondary | ICD-10-CM

## 2020-09-27 DIAGNOSIS — Z95828 Presence of other vascular implants and grafts: Secondary | ICD-10-CM

## 2020-09-27 DIAGNOSIS — R3912 Poor urinary stream: Secondary | ICD-10-CM | POA: Insufficient documentation

## 2020-09-27 LAB — CBC WITH DIFFERENTIAL (CANCER CENTER ONLY)
Abs Immature Granulocytes: 0.01 10*3/uL (ref 0.00–0.07)
Basophils Absolute: 0 10*3/uL (ref 0.0–0.1)
Basophils Relative: 0 %
Eosinophils Absolute: 0.3 10*3/uL (ref 0.0–0.5)
Eosinophils Relative: 5 %
HCT: 39.8 % (ref 39.0–52.0)
Hemoglobin: 13.2 g/dL (ref 13.0–17.0)
Immature Granulocytes: 0 %
Lymphocytes Relative: 34 %
Lymphs Abs: 2.3 10*3/uL (ref 0.7–4.0)
MCH: 28.2 pg (ref 26.0–34.0)
MCHC: 33.2 g/dL (ref 30.0–36.0)
MCV: 85 fL (ref 80.0–100.0)
Monocytes Absolute: 0.5 10*3/uL (ref 0.1–1.0)
Monocytes Relative: 8 %
Neutro Abs: 3.6 10*3/uL (ref 1.7–7.7)
Neutrophils Relative %: 53 %
Platelet Count: 181 10*3/uL (ref 150–400)
RBC: 4.68 MIL/uL (ref 4.22–5.81)
RDW: 13.8 % (ref 11.5–15.5)
WBC Count: 6.7 10*3/uL (ref 4.0–10.5)
nRBC: 0 % (ref 0.0–0.2)

## 2020-09-27 LAB — CMP (CANCER CENTER ONLY)
ALT: 15 U/L (ref 0–44)
AST: 18 U/L (ref 15–41)
Albumin: 4 g/dL (ref 3.5–5.0)
Alkaline Phosphatase: 72 U/L (ref 38–126)
Anion gap: 6 (ref 5–15)
BUN: 25 mg/dL — ABNORMAL HIGH (ref 8–23)
CO2: 31 mmol/L (ref 22–32)
Calcium: 9.6 mg/dL (ref 8.9–10.3)
Chloride: 101 mmol/L (ref 98–111)
Creatinine: 1.2 mg/dL (ref 0.61–1.24)
GFR, Estimated: >60 mL/min (ref >60)
Glucose, Bld: 94 mg/dL (ref 70–99)
Potassium: 3.2 mmol/L — ABNORMAL LOW (ref 3.5–5.1)
Sodium: 138 mmol/L (ref 135–145)
Total Bilirubin: 0.6 mg/dL (ref 0.3–1.2)
Total Protein: 7.3 g/dL (ref 6.5–8.1)

## 2020-09-27 LAB — TSH: TSH: 2.038 u[IU]/mL (ref 0.350–4.500)

## 2020-09-27 MED ORDER — SODIUM CHLORIDE 0.9% FLUSH
10.0000 mL | Freq: Once | INTRAVENOUS | Status: AC
  Administered 2020-09-27: 10 mL
  Filled 2020-09-27: qty 10

## 2020-09-27 MED ORDER — POTASSIUM GLUCONATE 595 (99 K) MG PO TABS: 595.0000 mg | ORAL_TABLET | Freq: Every day | ORAL | 1 refills | Status: DC

## 2020-09-27 MED ORDER — INDAPAMIDE 2.5 MG PO TABS
2.5000 mg | ORAL_TABLET | Freq: Every day | ORAL | 1 refills | Status: DC
Start: 2020-09-27 — End: 2021-03-22

## 2020-09-27 MED ORDER — HEPARIN SOD (PORK) LOCK FLUSH 100 UNIT/ML IV SOLN
500.0000 [IU] | Freq: Once | INTRAVENOUS | Status: AC | PRN
  Administered 2020-09-27: 500 [IU]
  Filled 2020-09-27: qty 5

## 2020-09-27 MED ORDER — AMLODIPINE BESYLATE 10 MG PO TABS: 10.0000 mg | ORAL_TABLET | Freq: Every day | ORAL | 0 refills | Status: DC

## 2020-09-27 MED ORDER — SODIUM CHLORIDE 0.9 % IV SOLN
Freq: Once | INTRAVENOUS | Status: AC
Start: 2020-09-27 — End: 2020-09-27
  Filled 2020-09-27: qty 250

## 2020-09-27 MED ORDER — ATORVASTATIN CALCIUM 10 MG PO TABS: 10.0000 mg | ORAL_TABLET | Freq: Every day | ORAL | 1 refills | Status: DC

## 2020-09-27 MED ORDER — METOPROLOL TARTRATE 50 MG PO TABS: 50.0000 mg | ORAL_TABLET | Freq: Two times a day (BID) | ORAL | 0 refills | Status: DC

## 2020-09-27 MED ORDER — SODIUM CHLORIDE 0.9 % IV SOLN
1200.0000 mg | Freq: Once | INTRAVENOUS | Status: AC
  Administered 2020-09-27: 1200 mg via INTRAVENOUS
  Filled 2020-09-27: qty 20

## 2020-09-27 MED ORDER — SODIUM CHLORIDE 0.9% FLUSH
10.0000 mL | INTRAVENOUS | Status: DC | PRN
  Administered 2020-09-27: 10 mL
  Filled 2020-09-27: qty 10

## 2020-09-27 MED ORDER — POTASSIUM GLUCONATE 595 (99 K) MG PO TABS: 595.0000 mg | ORAL_TABLET | Freq: Two times a day (BID) | ORAL | 1 refills | Status: DC

## 2020-09-27 NOTE — Progress Notes (Signed)
col  Subjective:  Patient ID: Mario Hubbard, male    DOB: 1950/07/12  Age: 70 y.o. MRN: [id number]  CC: Hypertension and Hyperlipidemia  This visit occurred during the SARS-CoV-2 public health emergency.  Safety protocols were in place, including screening questions prior to the visit, additional usage of staff PPE, and extensive cleaning of exam room while observing appropriate contact time as indicated for disinfecting solutions.    HPI Mario Hubbard presents for f/up -   He walks several miles a day and does not experience CP, DOE, palpitations, edema, fatigue, dizziness, or lightheadedness.  Outpatient Medications Prior to Visit  Medication Sig Dispense Refill   losartan (COZAAR) 100 MG tablet Take 100 mg by mouth daily.     sildenafil (VIAGRA) 50 MG tablet Take 50 mg by mouth daily as needed for erectile dysfunction.     amLODipine (NORVASC) 10 MG tablet Take 10 mg by mouth daily.     atorvastatin (LIPITOR) 10 MG tablet Take 1 tablet (10 mg total) by mouth daily. 90 tablet 1   indapamide (LOZOL) 2.5 MG tablet Take 1 tablet (2.5 mg total) by mouth daily. 90 tablet 1   metoprolol tartrate (LOPRESSOR) 50 MG tablet Take 50 mg by mouth 2 (two) times daily.     potassium gluconate 595 (99 K) MG TABS tablet Take 595 mg by mouth.     sodium chloride flush (NS) 0.9 % injection 10 mL      No facility-administered medications prior to visit.    ROS Review of Systems  Constitutional:  Negative for diaphoresis, fatigue and unexpected weight change.  HENT: Negative.    Eyes: Negative.   Respiratory:  Negative for cough, chest tightness and wheezing.   Cardiovascular:  Negative for chest pain, palpitations and leg swelling.  Gastrointestinal:  Negative for abdominal pain, diarrhea and nausea.  Endocrine: Negative.   Genitourinary: Negative.  Negative for difficulty urinating.  Musculoskeletal:  Negative for arthralgias and myalgias.  Skin: Negative.  Negative for color change.  Neurological:  Negative.  Negative for dizziness, weakness and headaches.  Hematological:  Negative for adenopathy. Does not bruise/bleed easily.  Psychiatric/Behavioral: Negative.     Objective:  BP 136/76 (BP Location: Right Arm, Patient Position: Sitting, Cuff Size: Large)   Pulse 65   Temp 98.5 F (36.9 C) (Oral)   Resp 16   Ht 5\' 9"  (1.753 m)   Wt 203 lb (92.1 kg)   SpO2 98%   BMI 29.98 kg/m   BP Readings from Last 3 Encounters:  09/27/20 136/76  09/27/20 125/68  09/07/20 131/68    Wt Readings from Last 3 Encounters:  09/27/20 203 lb (92.1 kg)  09/27/20 201 lb 9.6 oz (91.4 kg)  09/07/20 202 lb 3.2 oz (91.7 kg)    Physical Exam Vitals reviewed.  HENT:     Nose: Nose normal.     Mouth/Throat:     Mouth: Mucous membranes are moist.  Eyes:     Conjunctiva/sclera: Conjunctivae normal.  Cardiovascular:     Rate and Rhythm: Normal rate and regular rhythm.     Heart sounds: No murmur heard. Pulmonary:     Effort: Pulmonary effort is normal.     Breath sounds: No stridor. No wheezing, rhonchi or rales.  Abdominal:     General: Abdomen is flat.     Tenderness: There is no abdominal tenderness.  Musculoskeletal:        General: Normal range of motion.     Cervical back: Neck supple.  Right lower leg: No edema.     Left lower leg: No edema.  Lymphadenopathy:     Cervical: No cervical adenopathy.  Skin:    General: Skin is warm and dry.  Neurological:     General: No focal deficit present.     Mental Status: He is alert.  Psychiatric:        Mood and Affect: Mood normal.    Lab Results  Component Value Date   WBC 6.7 09/27/2020   HGB 13.2 09/27/2020   HCT 39.8 09/27/2020   PLT 181 09/27/2020   GLUCOSE 94 09/27/2020   CHOL 174 04/04/2020   TRIG 75.0 04/04/2020   HDL 46.30 04/04/2020   LDLCALC 113 (H) 04/04/2020   ALT 15 09/27/2020   AST 18 09/27/2020   NA 138 09/27/2020   K 3.2 (L) 09/27/2020   CL 101 09/27/2020   CREATININE 1.20 09/27/2020   BUN 25 (H)  09/27/2020   CO2 31 09/27/2020   TSH 2.038 09/27/2020   PSA 3.17 05/16/2020    CT ABDOMEN PELVIS WO CONTRAST  Result Date: 09/21/2020 CLINICAL DATA:  Evaluate urethral cancer EXAM: CT CHEST, ABDOMEN AND PELVIS WITHOUT CONTRAST TECHNIQUE: Multidetector CT imaging of the chest, abdomen and pelvis was performed following the standard protocol without IV contrast. Oral enteric contrast was administered. COMPARISON:  CT chest abdomen pelvis, 03/21/2020 FINDINGS: CT CHEST FINDINGS Cardiovascular: Left chest port catheter. Aortic atherosclerosis. Normal heart size. Three-vessel coronary artery calcifications. No pericardial effusion. Mediastinum/Nodes: No enlarged mediastinal, hilar, or axillary lymph nodes. Thyroid gland, trachea, and esophagus demonstrate no significant findings. Lungs/Pleura: Mild centrilobular and paraseptal emphysema. Scattered areas of bandlike scarring, for example in the right apex (series 6, image 43). No pleural effusion or pneumothorax. Musculoskeletal: No chest wall mass or suspicious bone lesions identified. CT ABDOMEN PELVIS FINDINGS Hepatobiliary: No solid liver abnormality is seen. No gallstones, gallbladder wall thickening, or biliary dilatation. Pancreas: Unremarkable. No pancreatic ductal dilatation or surrounding inflammatory changes. Spleen: Normal in size without significant abnormality. Adrenals/Urinary Tract: Adrenal glands are unremarkable. Kidneys are normal, without renal calculi, solid lesion, or hydronephrosis. Bladder is unremarkable. Stomach/Bowel: Stomach is within normal limits. Appendix appears normal. No evidence of bowel wall thickening, distention, or inflammatory changes. Descending colonic diverticulosis. Vascular/Lymphatic: Aortic atherosclerosis. No enlarged abdominal or pelvic lymph nodes. Reproductive: Mild prostatomegaly. Other: Small, fat containing umbilical hernia. No abdominopelvic ascites. Surgical clips in the left groin. Musculoskeletal: No acute  or significant osseous findings. IMPRESSION: 1. No noncontrast evidence of mass, lymphadenopathy, or metastatic disease in the chest, abdomen, or pelvis. 2. Prostatomegaly. 3. Diverticulosis without evidence of acute diverticulitis. 4. Emphysema. 5. Coronary artery disease. Aortic Atherosclerosis (ICD10-I70.0) and Emphysema (ICD10-J43.9). Electronically Signed   By: Eddie Candle M.D.   On: 09/21/2020 12:47   CT Chest Wo Contrast  Result Date: 09/21/2020 CLINICAL DATA:  Evaluate urethral cancer EXAM: CT CHEST, ABDOMEN AND PELVIS WITHOUT CONTRAST TECHNIQUE: Multidetector CT imaging of the chest, abdomen and pelvis was performed following the standard protocol without IV contrast. Oral enteric contrast was administered. COMPARISON:  CT chest abdomen pelvis, 03/21/2020 FINDINGS: CT CHEST FINDINGS Cardiovascular: Left chest port catheter. Aortic atherosclerosis. Normal heart size. Three-vessel coronary artery calcifications. No pericardial effusion. Mediastinum/Nodes: No enlarged mediastinal, hilar, or axillary lymph nodes. Thyroid gland, trachea, and esophagus demonstrate no significant findings. Lungs/Pleura: Mild centrilobular and paraseptal emphysema. Scattered areas of bandlike scarring, for example in the right apex (series 6, image 43). No pleural effusion or pneumothorax. Musculoskeletal: No chest wall mass or  suspicious bone lesions identified. CT ABDOMEN PELVIS FINDINGS Hepatobiliary: No solid liver abnormality is seen. No gallstones, gallbladder wall thickening, or biliary dilatation. Pancreas: Unremarkable. No pancreatic ductal dilatation or surrounding inflammatory changes. Spleen: Normal in size without significant abnormality. Adrenals/Urinary Tract: Adrenal glands are unremarkable. Kidneys are normal, without renal calculi, solid lesion, or hydronephrosis. Bladder is unremarkable. Stomach/Bowel: Stomach is within normal limits. Appendix appears normal. No evidence of bowel wall thickening, distention,  or inflammatory changes. Descending colonic diverticulosis. Vascular/Lymphatic: Aortic atherosclerosis. No enlarged abdominal or pelvic lymph nodes. Reproductive: Mild prostatomegaly. Other: Small, fat containing umbilical hernia. No abdominopelvic ascites. Surgical clips in the left groin. Musculoskeletal: No acute or significant osseous findings. IMPRESSION: 1. No noncontrast evidence of mass, lymphadenopathy, or metastatic disease in the chest, abdomen, or pelvis. 2. Prostatomegaly. 3. Diverticulosis without evidence of acute diverticulitis. 4. Emphysema. 5. Coronary artery disease. Aortic Atherosclerosis (ICD10-I70.0) and Emphysema (ICD10-J43.9). Electronically Signed   By: Eddie Candle M.D.   On: 09/21/2020 12:47    Assessment & Plan:   Mario Hubbard was seen today for hypertension and hyperlipidemia.  Diagnoses and all orders for this visit:  Benign prostatic hyperplasia with weak urinary stream -     Cancel: PSA; Future  Primary hypertension- His blood pressure is adequately well controlled.  His potassium level is low so I have increased the dose of his potassium supplement. -     amLODipine (NORVASC) 10 MG tablet; Take 1 tablet (10 mg total) by mouth daily. -     indapamide (LOZOL) 2.5 MG tablet; Take 1 tablet (2.5 mg total) by mouth daily. -     Discontinue: potassium gluconate 595 (99 K) MG TABS tablet; Take 1 tablet (595 mg total) by mouth daily. -     metoprolol tartrate (LOPRESSOR) 50 MG tablet; Take 1 tablet (50 mg total) by mouth 2 (two) times daily. -     potassium gluconate 595 (99 K) MG TABS tablet; Take 1 tablet (595 mg total) by mouth 2 (two) times daily.  Hyperlipidemia LDL goal <100- LDL goal achieved. Doing well on the statin  -     atorvastatin (LIPITOR) 10 MG tablet; Take 1 tablet (10 mg total) by mouth daily.  Chronic hypokalemia -     potassium gluconate 595 (99 K) MG TABS tablet; Take 1 tablet (595 mg total) by mouth 2 (two) times daily.  Polyp of colon, unspecified  part of colon, unspecified type -     Ambulatory referral to Gastroenterology  I have discontinued Mario Hubbard's potassium gluconate. I have also changed his amLODipine, metoprolol tartrate, and potassium gluconate. Additionally, I am having him maintain his sildenafil, losartan, indapamide, and atorvastatin.  Meds ordered this encounter  Medications   amLODipine (NORVASC) 10 MG tablet    Sig: Take 1 tablet (10 mg total) by mouth daily.    Dispense:  90 tablet    Refill:  0   indapamide (LOZOL) 2.5 MG tablet    Sig: Take 1 tablet (2.5 mg total) by mouth daily.    Dispense:  90 tablet    Refill:  1   atorvastatin (LIPITOR) 10 MG tablet    Sig: Take 1 tablet (10 mg total) by mouth daily.    Dispense:  90 tablet    Refill:  1   DISCONTD: potassium gluconate 595 (99 K) MG TABS tablet    Sig: Take 1 tablet (595 mg total) by mouth daily.    Dispense:  90 tablet    Refill:  1  metoprolol tartrate (LOPRESSOR) 50 MG tablet    Sig: Take 1 tablet (50 mg total) by mouth 2 (two) times daily.    Dispense:  180 tablet    Refill:  0   potassium gluconate 595 (99 K) MG TABS tablet    Sig: Take 1 tablet (595 mg total) by mouth 2 (two) times daily.    Dispense:  180 tablet    Refill:  1     Follow-up: Return in about 3 months (around 12/28/2020).  Scarlette Calico, MD

## 2020-09-27 NOTE — Patient Instructions (Signed)
[facility]ICAL [facility]COLOG[facility]  Discharge Instructions: ?Thank you for choosing [facility] to provide your oncology and hematology care.  ? ?If you have a lab appointment with the Cancer Center, please go directly to the Cancer Center and check in at the registration area. ?  ?Wear comfortable clothing and clothing appropriate for easy access to any Portacath or PICC line.  ? ?We strive to give you quality time with your provider. You may need to reschedule your appointment if you arrive late (15 or more minutes).  Arriving late affects you and other patients whose appointments are after yours.  Also, if you miss three or more appointments without notifying the office, you may be dismissed from the clinic at the provider?s discretion.    ?  ?For prescription refill requests, have your pharmacy contact our office and allow 72 hours for refills to be completed.   ? ?Today you received the following chemotherapy and/or immunotherapy agents: atezolizumab    ?  ?To help prevent nausea and vomiting after your treatment, we encourage you to take your nausea medication as directed. ? ?BELOW ARE SYMPTOMS THAT SHOULD BE REPORTED IMMEDIATELY: ?*FEVER GREATER THAN 100.4 F (38 ?C) OR HIGHER ?*CHILLS OR SWEATING ?*NAUSEA AND VOMITING THAT IS NOT CONTROLLED WITH YOUR NAUSEA MEDICATION ?*UNUSUAL SHORTNESS OF BREATH ?*UNUSUAL BRUISING OR BLEEDING ?*URINARY PROBLEMS (pain or burning when urinating, or frequent urination) ?*BOWEL PROBLEMS (unusual diarrhea, constipation, pain near the anus) ?TENDERNESS IN MOUTH AND THROAT WITH OR WITHOUT PRESENCE OF ULCERS (sore throat, sores in mouth, or a toothache) ?UNUSUAL RASH, SWELLING OR PAIN  ?UNUSUAL VAGINAL DISCHARGE OR ITCHING  ? ?Items with * indicate a potential emergency and should be followed up as soon as possible or go to the Emergency Department if any problems should occur. ? ?Please show the CHEMOTHERAPY ALERT CARD or IMMUNOTHERAPY ALERT CARD at check-in  to the Emergency Department and triage nurse. ? ?Should you have questions after your visit or need to cancel or reschedule your appointment, please contact [facility]  Dept: [phone]  and follow the prompts.  Office hours are 8:00 a.m. to 4:30 p.m. Monday - Friday. Please note that voicemails left after 4:00 p.m. may not be returned until the following business day.  We are closed weekends and major holidays. You have access to a nurse at all times for urgent questions. Please call the main number to the clinic Dept: [phone] and follow the prompts. ? ? ?For any non-urgent questions, you may also contact your provider using MyChart. We now offer e-Visits for anyone 18 and older to request care online for non-urgent symptoms. For details visit [email or web address]. ?  ?Also download the MyChart app! Go to the app store, search "MyChart", open the app, select [facility], and log in with your MyChart username and password. ? ?Due to Covid, a mask is required upon entering the hospital/clinic. If you do not have a mask, one will be given to you upon arrival. For doctor visits, patients may have 1 support person aged 18 or older with them. For treatment visits, patients cannot have anyone with them due to current Covid guidelines and our immunocompromised population.  ? ?

## 2020-09-27 NOTE — Patient Instructions (Signed)
Hypokalemia Hypokalemia means that the amount of potassium in the blood is lower than normal. Potassium is a chemical (electrolyte) that helps regulate the amount of fluid in the body. It also stimulates muscle tightening (contraction) and helps nerves work properly. Normally, most of the body's potassium is inside cells, and only a very small amount is in the blood. Because the amount in the blood is so small, minorchanges to potassium levels in the blood can be life-threatening. What are the causes? This condition may be caused by: Antibiotic medicine. Diarrhea or vomiting. Taking too much of a medicine that helps you have a bowel movement (laxative) can cause diarrhea and lead to hypokalemia. Chronic kidney disease (CKD). Medicines that help the body get rid of excess fluid (diuretics). Eating disorders, such as bulimia. Low magnesium levels in the body. Sweating a lot. What are the signs or symptoms? Symptoms of this condition include: Weakness. Constipation. Fatigue. Muscle cramps. Mental confusion. Skipped heartbeats or irregular heartbeat (palpitations). Tingling or numbness. How is this diagnosed? This condition is diagnosed with a blood test. How is this treated? This condition may be treated by: Taking potassium supplements by mouth. Adjusting the medicines that you take. Eating more foods that contain a lot of potassium. If your potassium level is very low, you may need to get potassium through anIV and be monitored in the hospital. Follow these instructions at home:  Take over-the-counter and prescription medicines only as told by your health care provider. This includes vitamins and supplements. Eat a healthy diet. A healthy diet includes fresh fruits and vegetables, whole grains, healthy fats, and lean proteins. If instructed, eat more foods that contain a lot of potassium. This includes: Nuts, such as peanuts and pistachios. Seeds, such as sunflower seeds and pumpkin  seeds. Peas, lentils, and lima beans. Whole grain and bran cereals and breads. Fresh fruits and vegetables, such as apricots, avocado, bananas, cantaloupe, kiwi, oranges, tomatoes, asparagus, and potatoes. Orange juice. Tomato juice. Red meats. Yogurt. Keep all follow-up visits as told by your health care provider. This is important. Contact a health care provider if you: Have weakness that gets worse. Feel your heart pounding or racing. Vomit. Have diarrhea. Have diabetes (diabetes mellitus) and you have trouble keeping your blood sugar (glucose) in your target range. Get help right away if you: Have chest pain. Have shortness of breath. Have vomiting or diarrhea that lasts for more than 2 days. Faint. Summary Hypokalemia means that the amount of potassium in the blood is lower than normal. This condition is diagnosed with a blood test. Hypokalemia may be treated by taking potassium supplements, adjusting the medicines that you take, or eating more foods that are high in potassium. If your potassium level is very low, you may need to get potassium through an IV and be monitored in the hospital. This information is not intended to replace advice given to you by your health care provider. Make sure you discuss any questions you have with your healthcare provider. Document Revised: 11/06/2017 Document Reviewed: 11/06/2017 Elsevier Patient Education  [place].

## 2020-09-27 NOTE — Progress Notes (Signed)
Hematology and Oncology Follow Up Visit  Mario Hubbard [id number] 1950/12/28 70 y.o. 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     Prior Therapy: He underwent surgical resection followed by radiation therapy in 2015.  He subsequently developed metastatic disease with Hubbard-sided pelvic lymph node and confirmed the presence of relapsed disease that is PET avid in 2017.   He was treated with carboplatin and Taxol initially with excellent response after 3 cycles. 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   He did show worsening disease in October 2018 and subsequently was started on Tecentriq. He relocated to [place] and received Tecentriq maintenance therapy with excellent response. CT scan in August 2021 continues to show no evidence of disease.  Current therapy: Tecentriq 1200 mg every 3 weeks started in 2018 while living in [place].  He is currently receiving it locally started on February 09, 2020.  He is here for the next cycle of therapy.  Interim History: Mario Hubbard returns today for a follow-up visit.  Since last visit, he reports no major changes in his health.  He has tolerated Tecentriq without any recent complaints.  He denies any nausea, vomiting or abdominal pain.  He denies any skin rash or GI symptoms.  He denies any hospitalizations or illnesses.    Medications: Updated on review. Current Outpatient Medications  Medication Sig Dispense Refill   amLODipine (NORVASC) 10 MG tablet Take 10 mg by mouth daily.     atorvastatin (LIPITOR) 10 MG tablet Take 1 tablet (10 mg total) by mouth daily. 90 tablet 1   indapamide (LOZOL) 2.5 MG tablet Take 1 tablet (2.5 mg total) by mouth daily. 90 tablet 1   losartan (COZAAR) 100  MG tablet Take 100 mg by mouth daily.     metoprolol tartrate (LOPRESSOR) 50 MG tablet Take 50 mg by mouth 2 (two) times daily.     potassium gluconate 595 (99 K) MG TABS tablet Take 595 mg by mouth.     sildenafil (VIAGRA) 50 MG tablet Take 50 mg by mouth daily as needed for erectile dysfunction.     No current facility-administered medications for this visit.     Allergies: No Known Allergies    Physical Exam:           ECOG: 0   General appearance: Comfortable appearing without any discomfort Head: Normocephalic without any trauma Oropharynx: Mucous membranes are moist and pink without any thrush or ulcers. Eyes: Pupils are equal and round reactive to light. Lymph nodes: No cervical, supraclavicular, inguinal or axillary lymphadenopathy.   Heart:regular rate and rhythm.  S1 and S2 without leg edema. Lung: Clear without any rhonchi or wheezes.  No dullness to percussion. Abdomin: Soft, nontender, nondistended with good bowel sounds.  No hepatosplenomegaly. Musculoskeletal: No joint deformity or effusion.  Full range of motion noted. Neurological: No deficits noted on motor, sensory and deep tendon reflex exam. Skin: No petechial rash or dryness.  Appeared moist.  Psychiatric: Mood and affect appeared appropriate.               Lab Results: Lab Results  Component Value Date   WBC 5.9 09/07/2020   HGB 13.2 09/07/2020   HCT 39.4 09/07/2020   MCV 84.5 09/07/2020   PLT 187 09/07/2020     Chemistry  Component Value Date/Time   NA 142 09/07/2020 0754   K 3.4 (L) 09/07/2020 0754   CL 103 09/07/2020 0754   CO2 27 09/07/2020 0754   BUN 18 09/07/2020 0754   CREATININE 1.12 09/07/2020 0754      Component Value Date/Time   CALCIUM 9.8 09/07/2020 0754   ALKPHOS 73 09/07/2020 0754   AST 26 09/07/2020 0754   ALT 18 09/07/2020 0754   BILITOT 0.4 09/07/2020 0754      IMPRESSION: 1. No noncontrast evidence of mass, lymphadenopathy, or  metastatic disease in the chest, abdomen, or pelvis. 2. Prostatomegaly. 3. Diverticulosis without evidence of acute diverticulitis. 4. Emphysema. 5. Coronary artery disease.   Aortic Atherosclerosis (ICD10-I70.0) and Emphysema (ICD10-J43.9).  Impression and Plan:  70 year old with:   1.  Stage IV urothelial carcinoma with squamous differentiation arising from the penile urethra diagnosed in 2015.  He presented with pelvic adenopathy.  's disease status was updated at this time and treatment choices were reviewed.  He continues to tolerate Tecentriq without any major complications.  CT scan obtained on 09/22/2020 showed a complete response to therapy without any residual disease.  Risks and benefits of continuing this treatment for the time being were discussed versus therapy discontinuation and active surveillance.  For the time being we opted to continue treatment and potential discontinuation after the next scan.  Anticipate repeat imaging studies in 6 months.  2. IV access: Port-A-Cath remains in use without any issues.   3. Antiemetics: No nausea or vomiting reported at this time.  Compazine is available  4.  Immune mediated complications.  I continue to educate him about potential complications.  These include pneumonitis, colitis, thyroid disease among others.   5. Follow-up: He will return in 3 weeks for the next evaluation.     30  minutes were spent on this visit.  The time was spent on reviewing imaging studies, treatment choices, complication added to his cancer and cancer therapy.   Mario Button, MD 6/21/20228:08 AM

## 2020-09-29 ENCOUNTER — Telehealth: Payer: Self-pay | Admitting: Oncology

## 2020-09-29 NOTE — Telephone Encounter (Signed)
Scheduled appointment per 06/22 sch msg. Patient is aware.

## 2020-09-30 ENCOUNTER — Ambulatory Visit: Payer: Medicare Other | Admitting: Oncology

## 2020-09-30 ENCOUNTER — Other Ambulatory Visit: Payer: Medicare Other

## 2020-09-30 ENCOUNTER — Ambulatory Visit: Payer: Medicare Other

## 2020-10-18 ENCOUNTER — Ambulatory Visit: Payer: Medicare Other

## 2020-10-18 ENCOUNTER — Inpatient Hospital Stay: Payer: Medicare Other

## 2020-10-18 ENCOUNTER — Inpatient Hospital Stay: Payer: Medicare Other | Attending: Oncology | Admitting: Oncology

## 2020-10-18 ENCOUNTER — Other Ambulatory Visit: Payer: Self-pay

## 2020-10-18 VITALS — BP 139/65 | HR 63 | Temp 98.2°F | Resp 18 | Ht 69.0 in | Wt 200.1 lb

## 2020-10-18 DIAGNOSIS — E032 Hypothyroidism due to medicaments and other exogenous substances: Secondary | ICD-10-CM

## 2020-10-18 DIAGNOSIS — C609 Malignant neoplasm of penis, unspecified: Secondary | ICD-10-CM

## 2020-10-18 DIAGNOSIS — G629 Polyneuropathy, unspecified: Secondary | ICD-10-CM | POA: Insufficient documentation

## 2020-10-18 DIAGNOSIS — C680 Malignant neoplasm of urethra: Secondary | ICD-10-CM

## 2020-10-18 DIAGNOSIS — Z923 Personal history of irradiation: Secondary | ICD-10-CM | POA: Diagnosis not present

## 2020-10-18 DIAGNOSIS — Z95828 Presence of other vascular implants and grafts: Secondary | ICD-10-CM

## 2020-10-18 DIAGNOSIS — Z79899 Other long term (current) drug therapy: Secondary | ICD-10-CM | POA: Insufficient documentation

## 2020-10-18 DIAGNOSIS — Z5112 Encounter for antineoplastic immunotherapy: Secondary | ICD-10-CM | POA: Insufficient documentation

## 2020-10-18 LAB — CBC WITH DIFFERENTIAL (CANCER CENTER ONLY)
Abs Immature Granulocytes: 0.01 10*3/uL (ref 0.00–0.07)
Basophils Absolute: 0 10*3/uL (ref 0.0–0.1)
Basophils Relative: 0 %
Eosinophils Absolute: 0.3 10*3/uL (ref 0.0–0.5)
Eosinophils Relative: 5 %
HCT: 39.9 % (ref 39.0–52.0)
Hemoglobin: 13.3 g/dL (ref 13.0–17.0)
Immature Granulocytes: 0 %
Lymphocytes Relative: 32 %
Lymphs Abs: 2 10*3/uL (ref 0.7–4.0)
MCH: 28.7 pg (ref 26.0–34.0)
MCHC: 33.3 g/dL (ref 30.0–36.0)
MCV: 86 fL (ref 80.0–100.0)
Monocytes Absolute: 0.6 10*3/uL (ref 0.1–1.0)
Monocytes Relative: 10 %
Neutro Abs: 3.4 10*3/uL (ref 1.7–7.7)
Neutrophils Relative %: 53 %
Platelet Count: 185 10*3/uL (ref 150–400)
RBC: 4.64 MIL/uL (ref 4.22–5.81)
RDW: 14.3 % (ref 11.5–15.5)
WBC Count: 6.3 10*3/uL (ref 4.0–10.5)
nRBC: 0 % (ref 0.0–0.2)

## 2020-10-18 LAB — CMP (CANCER CENTER ONLY)
ALT: 18 U/L (ref 0–44)
AST: 25 U/L (ref 15–41)
Albumin: 3.9 g/dL (ref 3.5–5.0)
Alkaline Phosphatase: 75 U/L (ref 38–126)
Anion gap: 9 (ref 5–15)
BUN: 18 mg/dL (ref 8–23)
CO2: 30 mmol/L (ref 22–32)
Calcium: 9.8 mg/dL (ref 8.9–10.3)
Chloride: 102 mmol/L (ref 98–111)
Creatinine: 1.1 mg/dL (ref 0.61–1.24)
GFR, Estimated: >60 mL/min (ref >60)
Glucose, Bld: 96 mg/dL (ref 70–99)
Potassium: 3 mmol/L — ABNORMAL LOW (ref 3.5–5.1)
Sodium: 141 mmol/L (ref 135–145)
Total Bilirubin: 0.5 mg/dL (ref 0.3–1.2)
Total Protein: 7.4 g/dL (ref 6.5–8.1)

## 2020-10-18 LAB — TSH: TSH: 1.755 u[IU]/mL (ref 0.320–4.118)

## 2020-10-18 MED ORDER — SODIUM CHLORIDE 0.9 % IV SOLN
Freq: Once | INTRAVENOUS | Status: AC
  Filled 2020-10-18: qty 250

## 2020-10-18 MED ORDER — SODIUM CHLORIDE 0.9 % IV SOLN
1200.0000 mg | Freq: Once | INTRAVENOUS | Status: AC
  Administered 2020-10-18: 1200 mg via INTRAVENOUS
  Filled 2020-10-18: qty 20

## 2020-10-18 MED ORDER — SODIUM CHLORIDE 0.9% FLUSH
10.0000 mL | Freq: Once | INTRAVENOUS | Status: AC
Start: 2020-10-18 — End: 2020-10-18
  Administered 2020-10-18: 10 mL
  Filled 2020-10-18: qty 10

## 2020-10-18 MED ORDER — SODIUM CHLORIDE 0.9% FLUSH
10.0000 mL | INTRAVENOUS | Status: DC | PRN
  Filled 2020-10-18: qty 10

## 2020-10-18 MED ORDER — HEPARIN SOD (PORK) LOCK FLUSH 100 UNIT/ML IV SOLN
500.0000 [IU] | Freq: Once | INTRAVENOUS | Status: DC | PRN
  Filled 2020-10-18: qty 5

## 2020-10-18 NOTE — Patient Instructions (Addendum)
[facility] ONCOLOGY  Discharge Instructions: Thank you for choosing [facility] to provide your oncology and hematology care.   If you have a lab appointment with the [facility], please go directly to the [facility] and check in at the registration area.   Wear comfortable clothing and clothing appropriate for easy access to any Portacath or PICC line.   We strive to give you quality time with your provider. You may need to reschedule your appointment if you arrive late (15 or more minutes).  Arriving late affects you and other patients whose appointments are after yours.  Also, if you miss three or more appointments without notifying the office, you may be dismissed from the clinic at the provider's discretion.      For prescription refill requests, have your pharmacy contact our office and allow 72 hours for refills to be completed.    Today you received the following chemotherapy and/or immunotherapy agent: Tecentriq   To help prevent nausea and vomiting after your treatment, we encourage you to take your nausea medication as directed.  BELOW ARE SYMPTOMS THAT SHOULD BE REPORTED IMMEDIATELY: *FEVER GREATER THAN 100.4 F (38 C) OR HIGHER *CHILLS OR SWEATING *NAUSEA AND VOMITING THAT IS NOT CONTROLLED WITH YOUR NAUSEA MEDICATION *UNUSUAL SHORTNESS OF BREATH *UNUSUAL BRUISING OR BLEEDING *URINARY PROBLEMS (pain or burning when urinating, or frequent urination) *BOWEL PROBLEMS (unusual diarrhea, constipation, pain near the anus) TENDERNESS IN MOUTH AND THROAT WITH OR WITHOUT PRESENCE OF ULCERS (sore throat, sores in mouth, or a toothache) UNUSUAL RASH, SWELLING OR PAIN  UNUSUAL VAGINAL DISCHARGE OR ITCHING   Items with * indicate a potential emergency and should be followed up as soon as possible or go to the Emergency Department if any problems should occur.  Please show the CHEMOTHERAPY ALERT CARD or IMMUNOTHERAPY ALERT CARD at check-in to the  Emergency Department and triage nurse.  Should you have questions after your visit or need to cancel or reschedule your appointment, please contact [facility]  Dept: [phone]  and follow the prompts.  Office hours are 8:00 a.m. to 4:30 p.m. Monday - Friday. Please note that voicemails left after 4:00 p.m. may not be returned until the following business day.  We are closed weekends and major holidays. You have access to a nurse at all times for urgent questions. Please call the main number to the clinic Dept: [phone] and follow the prompts.   For any non-urgent questions, you may also contact your provider using MyChart. We now offer e-Visits for anyone 62 and older to request care online for non-urgent symptoms. For details visit [email or web address].   Also download the MyChart app! Go to the app store, search "MyChart", open the app, select [facility], and log in with your MyChart username and password.  Due to Covid, a mask is required upon entering the hospital/clinic. If you do not have a mask, one will be given to you upon arrival. For doctor visits, patients may have 1 support person aged 62 or older with them. For treatment visits, patients cannot have anyone with them due to current Covid guidelines and our immunocompromised population.   Hypokalemia Hypokalemia means that the amount of potassium in the blood is lower than normal. Potassium is a chemical (electrolyte) that helps regulate the amount of fluid in the body. It also stimulates muscle tightening (contraction) and helps nerves work properly. Normally, most of the body's potassium is inside cells, and only  a very small amount is in the blood. Because the amount in the blood is so small, minorchanges to potassium levels in the blood can be life-threatening. What are the causes? This condition may be caused by: Antibiotic medicine. Diarrhea or vomiting. Taking too much of a medicine that  helps you have a bowel movement (laxative) can cause diarrhea and lead to hypokalemia. Chronic kidney disease (CKD). Medicines that help the body get rid of excess fluid (diuretics). Eating disorders, such as bulimia. Low magnesium levels in the body. Sweating a lot. What are the signs or symptoms? Symptoms of this condition include: Weakness. Constipation. Fatigue. Muscle cramps. Mental confusion. Skipped heartbeats or irregular heartbeat (palpitations). Tingling or numbness. How is this diagnosed? This condition is diagnosed with a blood test. How is this treated? This condition may be treated by: Taking potassium supplements by mouth. Adjusting the medicines that you take. Eating more foods that contain a lot of potassium. If your potassium level is very low, you may need to get potassium through anIV and be monitored in the hospital. Follow these instructions at home:  Take over-the-counter and prescription medicines only as told by your health care provider. This includes vitamins and supplements. Eat a healthy diet. A healthy diet includes fresh fruits and vegetables, whole grains, healthy fats, and lean proteins. If instructed, eat more foods that contain a lot of potassium. This includes: Nuts, such as peanuts and pistachios. Seeds, such as sunflower seeds and pumpkin seeds. Peas, lentils, and lima beans. Whole grain and bran cereals and breads. Fresh fruits and vegetables, such as apricots, avocado, bananas, cantaloupe, kiwi, oranges, tomatoes, asparagus, and potatoes. Orange juice. Tomato juice. Red meats. Yogurt. Keep all follow-up visits as told by your health care provider. This is important. Contact a health care provider if you: Have weakness that gets worse. Feel your heart pounding or racing. Vomit. Have diarrhea. Have diabetes (diabetes mellitus) and you have trouble keeping your blood sugar (glucose) in your target range. Get help right away if  you: Have chest pain. Have shortness of breath. Have vomiting or diarrhea that lasts for more than 2 days. Faint. Summary Hypokalemia means that the amount of potassium in the blood is lower than normal. This condition is diagnosed with a blood test. Hypokalemia may be treated by taking potassium supplements, adjusting the medicines that you take, or eating more foods that are high in potassium. If your potassium level is very low, you may need to get potassium through an IV and be monitored in the hospital. This information is not intended to replace advice given to you by your health care provider. Make sure you discuss any questions you have with your healthcare provider. Document Revised: 11/06/2017 Document Reviewed: 11/06/2017 Elsevier Patient Education  [place].

## 2020-10-18 NOTE — Progress Notes (Signed)
Hematology and Oncology Follow Up Visit  Mario Hubbard [id number] 1950-08-03 70 y.o. 10/18/2020 8:02 AM [name], MDJones, [name], MD   Principle Diagnosis: 70 year old man with urothelial carcinoma of the penile urethra diagnosed in 2015.  He developed a stage IV with pelvic adenopathy in 2017.   Prior Therapy: He underwent surgical resection followed by radiation therapy in 2015.  He subsequently developed metastatic disease with right-sided pelvic lymph node and confirmed the presence of relapsed disease that is PET avid in 2017.   He was treated with carboplatin and Taxol initially with excellent response after 3 cycles. 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   He did show worsening disease in October 2018 and subsequently was started on Tecentriq. He relocated to [place] and received Tecentriq maintenance therapy with excellent response. CT scan in August 2021 continues to show no evidence of disease.  Current therapy: Tecentriq 1200 mg every 3 weeks started in 2018 while living in [place].  He is currently receiving it locally started on February 09, 2020.  He presents for the subsequent cycle of therapy.  Interim History: Mario Hubbard is here for repeat evaluation.  Since the last visit, he reports no major changes in his health.  He denies any complications related Tecentriq.  He denies any nausea, fatigue, skin rash or respiratory complaints.  He remains active and attends to activities of daily living.  He denies any nocturia or frequency.  His performance status quality of life remain excellent.    Medications: Reviewed without changes. Current Outpatient Medications  Medication Sig Dispense Refill   amLODipine (NORVASC) 10 MG tablet Take 1 tablet (10 mg total) by mouth daily. 90 tablet 0   atorvastatin (LIPITOR) 10 MG tablet Take 1 tablet (10 mg total) by mouth daily. 90 tablet 1   indapamide (LOZOL) 2.5 MG  tablet Take 1 tablet (2.5 mg total) by mouth daily. 90 tablet 1   losartan (COZAAR) 100 MG tablet Take 100 mg by mouth daily.     metoprolol tartrate (LOPRESSOR) 50 MG tablet Take 1 tablet (50 mg total) by mouth 2 (two) times daily. 180 tablet 0   potassium gluconate 595 (99 K) MG TABS tablet Take 1 tablet (595 mg total) by mouth 2 (two) times daily. 180 tablet 1   sildenafil (VIAGRA) 50 MG tablet Take 50 mg by mouth daily as needed for erectile dysfunction.     No current facility-administered medications for this visit.     Allergies: No Known Allergies    Physical Exam:     Blood pressure 139/65, pulse 63, temperature 98.2 F (36.8 C), temperature source Oral, resp. rate 18, height 5\' 9"  (1.753 m), weight 200 lb 1.6 oz (90.8 kg), SpO2 99 %.       ECOG: 0    General appearance: Alert, awake without any distress. Head: Atraumatic without abnormalities Oropharynx: Without any thrush or ulcers. Eyes: No scleral icterus. Lymph nodes: No lymphadenopathy noted in the cervical, supraclavicular, or axillary nodes Heart:regular rate and rhythm, without any murmurs or gallops.   Lung: Clear to auscultation without any rhonchi, wheezes or dullness to percussion. Abdomin: Soft, nontender without any shifting dullness or ascites. Musculoskeletal: No clubbing or cyanosis. Neurological: No motor or sensory deficits. Skin: No rashes or lesions.                 Lab Results: Lab Results  Component Value Date   WBC 6.7 09/27/2020   HGB  13.2 09/27/2020   HCT 39.8 09/27/2020   MCV 85.0 09/27/2020   PLT 181 09/27/2020     Chemistry      Component Value Date/Time   NA 138 09/27/2020 0803   K 3.2 (L) 09/27/2020 0803   CL 101 09/27/2020 0803   CO2 31 09/27/2020 0803   BUN 25 (H) 09/27/2020 0803   CREATININE 1.20 09/27/2020 0803      Component Value Date/Time   CALCIUM 9.6 09/27/2020 0803   ALKPHOS 72 09/27/2020 0803   AST 18 09/27/2020 0803   ALT 15  09/27/2020 0803   BILITOT 0.6 09/27/2020 0803        Impression and Plan:  69 year old with:   1.  Urothelial carcinoma arising from the penile urethra diagnosed in 2015.  He developed stage IV disease with pelvic adenopathy.  He is currently on Tecentriq and experienced excellent benefit and response to his treatment.  CT scan in June 2022 was reviewed again and discussed with the patient and continues to show complete response.  Risks and benefits of continuing this treatment at this time were reviewed and for the time being we will continue.  The duration of therapy was debated and he is agreeable to continue for the time being.  2. IV access: Port-A-Cath continues to be in use without any problems or difficulties.   3. Antiemetics: Compazine is available to him without any nausea or vomiting.  4.  Immune mediated complications.  He has not experienced any at this time.  I continue to educate him about potential issues including pneumonitis, colitis and thyroid disease.   5. Follow-up: In 3 weeks for the next cycle of therapy.     30  minutes were dedicated to this encounter.  The time was spent on reviewing laboratory data, disease status update, treatment choices and outlining future plan of care.    Zola Button, MD 7/12/20228:02 AM

## 2020-11-09 ENCOUNTER — Telehealth: Payer: Self-pay | Admitting: Oncology

## 2020-11-09 ENCOUNTER — Other Ambulatory Visit: Payer: Self-pay

## 2020-11-09 ENCOUNTER — Inpatient Hospital Stay: Payer: Medicare Other | Attending: Oncology

## 2020-11-09 ENCOUNTER — Ambulatory Visit: Payer: Medicare Other | Admitting: Oncology

## 2020-11-09 ENCOUNTER — Inpatient Hospital Stay: Payer: Medicare Other

## 2020-11-09 VITALS — BP 146/67 | HR 50 | Temp 97.8°F | Resp 18

## 2020-11-09 DIAGNOSIS — C774 Secondary and unspecified malignant neoplasm of inguinal and lower limb lymph nodes: Secondary | ICD-10-CM | POA: Diagnosis present

## 2020-11-09 DIAGNOSIS — C609 Malignant neoplasm of penis, unspecified: Secondary | ICD-10-CM

## 2020-11-09 DIAGNOSIS — Z95828 Presence of other vascular implants and grafts: Secondary | ICD-10-CM

## 2020-11-09 DIAGNOSIS — C680 Malignant neoplasm of urethra: Secondary | ICD-10-CM | POA: Diagnosis present

## 2020-11-09 DIAGNOSIS — Z923 Personal history of irradiation: Secondary | ICD-10-CM | POA: Insufficient documentation

## 2020-11-09 DIAGNOSIS — Z5112 Encounter for antineoplastic immunotherapy: Secondary | ICD-10-CM | POA: Insufficient documentation

## 2020-11-09 DIAGNOSIS — Z79899 Other long term (current) drug therapy: Secondary | ICD-10-CM | POA: Diagnosis not present

## 2020-11-09 DIAGNOSIS — E032 Hypothyroidism due to medicaments and other exogenous substances: Secondary | ICD-10-CM

## 2020-11-09 LAB — CBC WITH DIFFERENTIAL (CANCER CENTER ONLY)
Abs Immature Granulocytes: 0.02 10*3/uL (ref 0.00–0.07)
Basophils Absolute: 0 10*3/uL (ref 0.0–0.1)
Basophils Relative: 0 %
Eosinophils Absolute: 0.4 10*3/uL (ref 0.0–0.5)
Eosinophils Relative: 7 %
HCT: 38.6 % — ABNORMAL LOW (ref 39.0–52.0)
Hemoglobin: 12.8 g/dL — ABNORMAL LOW (ref 13.0–17.0)
Immature Granulocytes: 0 %
Lymphocytes Relative: 27 %
Lymphs Abs: 1.6 10*3/uL (ref 0.7–4.0)
MCH: 28.4 pg (ref 26.0–34.0)
MCHC: 33.2 g/dL (ref 30.0–36.0)
MCV: 85.6 fL (ref 80.0–100.0)
Monocytes Absolute: 0.5 10*3/uL (ref 0.1–1.0)
Monocytes Relative: 9 %
Neutro Abs: 3.5 10*3/uL (ref 1.7–7.7)
Neutrophils Relative %: 57 %
Platelet Count: 179 10*3/uL (ref 150–400)
RBC: 4.51 MIL/uL (ref 4.22–5.81)
RDW: 14.2 % (ref 11.5–15.5)
WBC Count: 6.1 10*3/uL (ref 4.0–10.5)
nRBC: 0 % (ref 0.0–0.2)

## 2020-11-09 LAB — CMP (CANCER CENTER ONLY)
ALT: 30 U/L (ref 0–44)
AST: 29 U/L (ref 15–41)
Albumin: 3.9 g/dL (ref 3.5–5.0)
Alkaline Phosphatase: 74 U/L (ref 38–126)
Anion gap: 9 (ref 5–15)
BUN: 22 mg/dL (ref 8–23)
CO2: 29 mmol/L (ref 22–32)
Calcium: 10 mg/dL (ref 8.9–10.3)
Chloride: 103 mmol/L (ref 98–111)
Creatinine: 1.1 mg/dL (ref 0.61–1.24)
GFR, Estimated: >60 mL/min (ref >60)
Glucose, Bld: 98 mg/dL (ref 70–99)
Potassium: 3.1 mmol/L — ABNORMAL LOW (ref 3.5–5.1)
Sodium: 141 mmol/L (ref 135–145)
Total Bilirubin: 0.4 mg/dL (ref 0.3–1.2)
Total Protein: 7.1 g/dL (ref 6.5–8.1)

## 2020-11-09 MED ORDER — SODIUM CHLORIDE 0.9% FLUSH
10.0000 mL | INTRAVENOUS | Status: DC | PRN
  Administered 2020-11-09: 10 mL
  Filled 2020-11-09: qty 10

## 2020-11-09 MED ORDER — HEPARIN SOD (PORK) LOCK FLUSH 100 UNIT/ML IV SOLN
500.0000 [IU] | Freq: Once | INTRAVENOUS | Status: AC | PRN
  Administered 2020-11-09: 500 [IU]
  Filled 2020-11-09: qty 5

## 2020-11-09 MED ORDER — SODIUM CHLORIDE 0.9 % IV SOLN
Freq: Once | INTRAVENOUS | Status: AC
  Filled 2020-11-09: qty 250

## 2020-11-09 MED ORDER — SODIUM CHLORIDE 0.9% FLUSH
10.0000 mL | Freq: Once | INTRAVENOUS | Status: AC
  Administered 2020-11-09: 10 mL
  Filled 2020-11-09: qty 10

## 2020-11-09 MED ORDER — ALTEPLASE 2 MG IJ SOLR
2.0000 mg | Freq: Once | INTRAMUSCULAR | Status: AC | PRN
  Administered 2020-11-09: 2 mg
  Filled 2020-11-09: qty 2

## 2020-11-09 MED ORDER — ALTEPLASE 2 MG IJ SOLR
INTRAMUSCULAR | Status: AC
  Filled 2020-11-09: qty 2

## 2020-11-09 MED ORDER — SODIUM CHLORIDE 0.9 % IV SOLN
1200.0000 mg | Freq: Once | INTRAVENOUS | Status: AC
  Administered 2020-11-09: 1200 mg via INTRAVENOUS
  Filled 2020-11-09: qty 20

## 2020-11-09 NOTE — Telephone Encounter (Signed)
Scheduled per 07/12 los, patient has been called and notified. of upcoming appointments. 

## 2020-11-09 NOTE — Patient Instructions (Signed)
[facility] ONCOLOGY  Discharge Instructions: Thank you for choosing [facility] to provide your oncology and hematology care.   If you have a lab appointment with the [facility], please go directly to the [facility] and check in at the registration area.   Wear comfortable clothing and clothing appropriate for easy access to any Portacath or PICC line.   We strive to give you quality time with your provider. You may need to reschedule your appointment if you arrive late (15 or more minutes).  Arriving late affects you and other patients whose appointments are after yours.  Also, if you miss three or more appointments without notifying the office, you may be dismissed from the clinic at the provider's discretion.      For prescription refill requests, have your pharmacy contact our office and allow 72 hours for refills to be completed.    Today you received the following chemotherapy and/or immunotherapy agent: Tecentriq   To help prevent nausea and vomiting after your treatment, we encourage you to take your nausea medication as directed.  BELOW ARE SYMPTOMS THAT SHOULD BE REPORTED IMMEDIATELY: *FEVER GREATER THAN 100.4 F (38 C) OR HIGHER *CHILLS OR SWEATING *NAUSEA AND VOMITING THAT IS NOT CONTROLLED WITH YOUR NAUSEA MEDICATION *UNUSUAL SHORTNESS OF BREATH *UNUSUAL BRUISING OR BLEEDING *URINARY PROBLEMS (pain or burning when urinating, or frequent urination) *BOWEL PROBLEMS (unusual diarrhea, constipation, pain near the anus) TENDERNESS IN MOUTH AND THROAT WITH OR WITHOUT PRESENCE OF ULCERS (sore throat, sores in mouth, or a toothache) UNUSUAL RASH, SWELLING OR PAIN  UNUSUAL VAGINAL DISCHARGE OR ITCHING   Items with * indicate a potential emergency and should be followed up as soon as possible or go to the Emergency Department if any problems should occur.  Please show the CHEMOTHERAPY ALERT CARD or IMMUNOTHERAPY ALERT CARD at check-in to the  Emergency Department and triage nurse.  Should you have questions after your visit or need to cancel or reschedule your appointment, please contact [facility]  Dept: [phone]  and follow the prompts.  Office hours are 8:00 a.m. to 4:30 p.m. Monday - Friday. Please note that voicemails left after 4:00 p.m. may not be returned until the following business day.  We are closed weekends and major holidays. You have access to a nurse at all times for urgent questions. Please call the main number to the clinic Dept: [phone] and follow the prompts.   For any non-urgent questions, you may also contact your provider using MyChart. We now offer e-Visits for anyone 106 and older to request care online for non-urgent symptoms. For details visit [email or web address].   Also download the MyChart app! Go to the app store, search "MyChart", open the app, select [facility], and log in with your MyChart username and password.  Due to Covid, a mask is required upon entering the hospital/clinic. If you do not have a mask, one will be given to you upon arrival. For doctor visits, patients may have 1 support person aged 67 or older with them. For treatment visits, patients cannot have anyone with them due to current Covid guidelines and our immunocompromised population.   Hypokalemia Hypokalemia means that the amount of potassium in the blood is lower than normal. Potassium is a chemical (electrolyte) that helps regulate the amount of fluid in the body. It also stimulates muscle tightening (contraction) and helps nerves work properly. Normally, most of the body's potassium is inside cells, and only  a very small amount is in the blood. Because the amount in the blood is so small, minorchanges to potassium levels in the blood can be life-threatening. What are the causes? This condition may be caused by: Antibiotic medicine. Diarrhea or vomiting. Taking too much of a medicine that  helps you have a bowel movement (laxative) can cause diarrhea and lead to hypokalemia. Chronic kidney disease (CKD). Medicines that help the body get rid of excess fluid (diuretics). Eating disorders, such as bulimia. Low magnesium levels in the body. Sweating a lot. What are the signs or symptoms? Symptoms of this condition include: Weakness. Constipation. Fatigue. Muscle cramps. Mental confusion. Skipped heartbeats or irregular heartbeat (palpitations). Tingling or numbness. How is this diagnosed? This condition is diagnosed with a blood test. How is this treated? This condition may be treated by: Taking potassium supplements by mouth. Adjusting the medicines that you take. Eating more foods that contain a lot of potassium. If your potassium level is very low, you may need to get potassium through anIV and be monitored in the hospital. Follow these instructions at home:  Take over-the-counter and prescription medicines only as told by your health care provider. This includes vitamins and supplements. Eat a healthy diet. A healthy diet includes fresh fruits and vegetables, whole grains, healthy fats, and lean proteins. If instructed, eat more foods that contain a lot of potassium. This includes: Nuts, such as peanuts and pistachios. Seeds, such as sunflower seeds and pumpkin seeds. Peas, lentils, and lima beans. Whole grain and bran cereals and breads. Fresh fruits and vegetables, such as apricots, avocado, bananas, cantaloupe, kiwi, oranges, tomatoes, asparagus, and potatoes. Orange juice. Tomato juice. Red meats. Yogurt. Keep all follow-up visits as told by your health care provider. This is important. Contact a health care provider if you: Have weakness that gets worse. Feel your heart pounding or racing. Vomit. Have diarrhea. Have diabetes (diabetes mellitus) and you have trouble keeping your blood sugar (glucose) in your target range. Get help right away if  you: Have chest pain. Have shortness of breath. Have vomiting or diarrhea that lasts for more than 2 days. Faint. Summary Hypokalemia means that the amount of potassium in the blood is lower than normal. This condition is diagnosed with a blood test. Hypokalemia may be treated by taking potassium supplements, adjusting the medicines that you take, or eating more foods that are high in potassium. If your potassium level is very low, you may need to get potassium through an IV and be monitored in the hospital. This information is not intended to replace advice given to you by your health care provider. Make sure you discuss any questions you have with your healthcare provider. Document Revised: 11/06/2017 Document Reviewed: 11/06/2017 Elsevier Patient Education  [place].

## 2020-11-10 LAB — TSH: TSH: 1.17 u[IU]/mL (ref 0.320–4.118)

## 2020-11-30 ENCOUNTER — Other Ambulatory Visit: Payer: Self-pay

## 2020-11-30 ENCOUNTER — Inpatient Hospital Stay: Payer: Medicare Other

## 2020-11-30 ENCOUNTER — Inpatient Hospital Stay (HOSPITAL_BASED_OUTPATIENT_CLINIC_OR_DEPARTMENT_OTHER): Payer: Medicare Other | Admitting: Oncology

## 2020-11-30 VITALS — BP 127/59 | HR 56 | Temp 98.3°F | Resp 18 | Ht 69.0 in | Wt 199.7 lb

## 2020-11-30 DIAGNOSIS — E032 Hypothyroidism due to medicaments and other exogenous substances: Secondary | ICD-10-CM

## 2020-11-30 DIAGNOSIS — C609 Malignant neoplasm of penis, unspecified: Secondary | ICD-10-CM

## 2020-11-30 DIAGNOSIS — Z5112 Encounter for antineoplastic immunotherapy: Secondary | ICD-10-CM | POA: Diagnosis not present

## 2020-11-30 DIAGNOSIS — C680 Malignant neoplasm of urethra: Secondary | ICD-10-CM

## 2020-11-30 DIAGNOSIS — Z95828 Presence of other vascular implants and grafts: Secondary | ICD-10-CM

## 2020-11-30 LAB — CBC WITH DIFFERENTIAL (CANCER CENTER ONLY)
Abs Immature Granulocytes: 0.01 10*3/uL (ref 0.00–0.07)
Basophils Absolute: 0 10*3/uL (ref 0.0–0.1)
Basophils Relative: 0 %
Eosinophils Absolute: 0.4 10*3/uL (ref 0.0–0.5)
Eosinophils Relative: 6 %
HCT: 38.3 % — ABNORMAL LOW (ref 39.0–52.0)
Hemoglobin: 12.9 g/dL — ABNORMAL LOW (ref 13.0–17.0)
Immature Granulocytes: 0 %
Lymphocytes Relative: 27 %
Lymphs Abs: 1.8 10*3/uL (ref 0.7–4.0)
MCH: 28.7 pg (ref 26.0–34.0)
MCHC: 33.7 g/dL (ref 30.0–36.0)
MCV: 85.3 fL (ref 80.0–100.0)
Monocytes Absolute: 0.5 10*3/uL (ref 0.1–1.0)
Monocytes Relative: 7 %
Neutro Abs: 4.1 10*3/uL (ref 1.7–7.7)
Neutrophils Relative %: 60 %
Platelet Count: 187 10*3/uL (ref 150–400)
RBC: 4.49 MIL/uL (ref 4.22–5.81)
RDW: 14.3 % (ref 11.5–15.5)
WBC Count: 6.8 10*3/uL (ref 4.0–10.5)
nRBC: 0 % (ref 0.0–0.2)

## 2020-11-30 LAB — CMP (CANCER CENTER ONLY)
ALT: 26 U/L (ref 0–44)
AST: 29 U/L (ref 15–41)
Albumin: 3.9 g/dL (ref 3.5–5.0)
Alkaline Phosphatase: 76 U/L (ref 38–126)
Anion gap: 12 (ref 5–15)
BUN: 22 mg/dL (ref 8–23)
CO2: 25 mmol/L (ref 22–32)
Calcium: 9.6 mg/dL (ref 8.9–10.3)
Chloride: 103 mmol/L (ref 98–111)
Creatinine: 1.13 mg/dL (ref 0.61–1.24)
GFR, Estimated: >60 mL/min (ref >60)
Glucose, Bld: 124 mg/dL — ABNORMAL HIGH (ref 70–99)
Potassium: 3.1 mmol/L — ABNORMAL LOW (ref 3.5–5.1)
Sodium: 140 mmol/L (ref 135–145)
Total Bilirubin: 0.4 mg/dL (ref 0.3–1.2)
Total Protein: 7.3 g/dL (ref 6.5–8.1)

## 2020-11-30 LAB — TSH: TSH: 1.614 u[IU]/mL (ref 0.320–4.118)

## 2020-11-30 MED ORDER — SODIUM CHLORIDE 0.9% FLUSH
10.0000 mL | INTRAVENOUS | Status: DC | PRN
  Administered 2020-11-30: 10 mL

## 2020-11-30 MED ORDER — SODIUM CHLORIDE 0.9 % IV SOLN
1200.0000 mg | Freq: Once | INTRAVENOUS | Status: AC
  Administered 2020-11-30: 1200 mg via INTRAVENOUS
  Filled 2020-11-30: qty 20

## 2020-11-30 MED ORDER — HEPARIN SOD (PORK) LOCK FLUSH 100 UNIT/ML IV SOLN
500.0000 [IU] | Freq: Once | INTRAVENOUS | Status: AC | PRN
Start: 2020-11-30 — End: 2020-11-30
  Administered 2020-11-30: 500 [IU]

## 2020-11-30 MED ORDER — SODIUM CHLORIDE 0.9 % IV SOLN
Freq: Once | INTRAVENOUS | Status: AC
Start: 2020-11-30 — End: 2020-11-30

## 2020-11-30 MED ORDER — SODIUM CHLORIDE 0.9% FLUSH
10.0000 mL | Freq: Once | INTRAVENOUS | Status: AC
  Administered 2020-11-30: 10 mL

## 2020-11-30 NOTE — Progress Notes (Signed)
Hematology and Oncology Follow Up Visit  Mario Hubbard [id number] 08-Nov-1950 70 y.o. 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  Initially was found to have localized disease in 2015.   Prior Therapy: He underwent surgical resection followed by radiation therapy in 2015.  He subsequently developed metastatic disease with Hubbard-sided pelvic lymph node and confirmed the presence of relapsed disease that is PET avid in 2017.   He was treated with carboplatin and Taxol initially with excellent response after 3 cycles. 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   He did show worsening disease in October 2018 and subsequently was started on Tecentriq. He relocated to [place] and received Tecentriq maintenance therapy with excellent response. CT scan in August 2021 continues to show no evidence of disease.  Current therapy: Tecentriq 1200 mg every 3 weeks started in 2018 while living in [place].  He is currently receiving it locally started on February 09, 2020.  He is here for the next cycle of therapy.  Interim History: Mario Hubbard presents today for a follow-up visit.  Since her last visit, he continues to tolerate therapy without any major complaints.  He denies any nausea vomiting or abdominal pain.  He denies any shortness of breath or diarrhea.  He denies any changes in quality of life.  He continues to be active and attends to activities of daily living.  He denies any pelvic pain or discomfort.    Medications: Updated on review. Current Outpatient Medications  Medication Sig Dispense Refill   amLODipine (NORVASC) 10 MG tablet Take 1 tablet (10 mg total) by mouth daily. 90 tablet 0   atorvastatin (LIPITOR) 10 MG tablet Take 1 tablet (10 mg total) by mouth daily. 90 tablet 1   indapamide  (LOZOL) 2.5 MG tablet Take 1 tablet (2.5 mg total) by mouth daily. 90 tablet 1   losartan (COZAAR) 100 MG tablet Take 100 mg by mouth daily.     metoprolol tartrate (LOPRESSOR) 50 MG tablet Take 1 tablet (50 mg total) by mouth 2 (two) times daily. 180 tablet 0   potassium gluconate 595 (99 K) MG TABS tablet Take 1 tablet (595 mg total) by mouth 2 (two) times daily. 180 tablet 1   sildenafil (VIAGRA) 50 MG tablet Take 50 mg by mouth daily as needed for erectile dysfunction.     No current facility-administered medications for this visit.     Allergies: No Known Allergies    Physical Exam:     Blood pressure (!) 127/59, pulse (!) 56, temperature 98.3 F (36.8 C), temperature source Oral, resp. rate 18, height '5\' 9"'$  (1.753 m), weight 199 lb 11.2 oz (90.6 kg), SpO2 100 %.       ECOG: 0   General appearance: Comfortable appearing without any discomfort Head: Normocephalic without any trauma Oropharynx: Mucous membranes are moist and pink without any thrush or ulcers. Eyes: Pupils are equal and round reactive to light. Lymph nodes: No cervical, supraclavicular, inguinal or axillary lymphadenopathy.   Heart:regular rate and rhythm.  S1 and S2 without leg edema. Lung: Clear without any rhonchi or wheezes.  No dullness to percussion. Abdomin: Soft, nontender, nondistended with good bowel sounds.  No hepatosplenomegaly. Musculoskeletal: No joint deformity or effusion.  Full range of motion noted. Neurological: No deficits noted on motor, sensory and deep tendon reflex exam. Skin:  No petechial rash or dryness.  Appeared moist.  Psychiatric: Mood and affect appeared appropriate.                  Lab Results: Lab Results  Component Value Date   WBC 6.8 11/30/2020   HGB 12.9 (L) 11/30/2020   HCT 38.3 (L) 11/30/2020   MCV 85.3 11/30/2020   PLT 187 11/30/2020     Chemistry      Component Value Date/Time   NA 141 11/09/2020 0845   K 3.1 (L) 11/09/2020 0845   CL  103 11/09/2020 0845   CO2 29 11/09/2020 0845   BUN 22 11/09/2020 0845   CREATININE 1.10 11/09/2020 0845      Component Value Date/Time   CALCIUM 10.0 11/09/2020 0845   ALKPHOS 74 11/09/2020 0845   AST 29 11/09/2020 0845   ALT 30 11/09/2020 0845   BILITOT 0.4 11/09/2020 0845        Impression and Plan:  70 year old with:   1.  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    He has experienced a complete response to therapy and continues to tolerate Tecentriq without any major complaints.  Risks and benefits of continuing this treatment long-term were reviewed.  Potential complications including immune mediated complications, GI toxicity among others were reiterated.  Alternative treatment choices including active surveillance and discontinuation of therapy.  For the time being we opted to continue with active treatment.  We will consider treatment discontinuation after the next scan if he continues to show complete response.  2. IV access: Port-A-Cath remains in use without any complications.   3. Antiemetics: No nausea or vomiting reported at this time.  Compazine is available without any issues.   4.  Immune mediated complications.  He is not experiencing pneumonitis, colitis and thyroid disease.  We will continue to monitor and educate him about these potential complications.   5. Follow-up: He will return in 3 weeks for next cycle of therapy.     30  minutes were spent on this visit.  The time was dedicated to reviewing laboratory data, disease status update, treatment choices and addressing complications related to his treatment.   Zola Button, MD 8/24/20228:10 AM

## 2020-11-30 NOTE — Patient Instructions (Signed)
[facility]  Discharge Instructions: °Thank you for choosing [facility] to provide your oncology and hematology care.  ° °If you have a lab appointment with the Cancer Center, please go directly to the Cancer Center and check in at the registration area. °  °Wear comfortable clothing and clothing appropriate for easy access to any Portacath or PICC line.  ° °We strive to give you quality time with your provider. You may need to reschedule your appointment if you arrive late (15 or more minutes).  Arriving late affects you and other patients whose appointments are after yours.  Also, if you miss three or more appointments without notifying the office, you may be dismissed from the clinic at the provider’s discretion.    °  °For prescription refill requests, have your pharmacy contact our office and allow 72 hours for refills to be completed.   ° °Today you received the following chemotherapy and/or immunotherapy agents: Tecentriq.     °  °To help prevent nausea and vomiting after your treatment, we encourage you to take your nausea medication as directed. ° °BELOW ARE SYMPTOMS THAT SHOULD BE REPORTED IMMEDIATELY: °*FEVER GREATER THAN 100.4 F (38 °C) OR HIGHER °*CHILLS OR SWEATING °*NAUSEA AND VOMITING THAT IS NOT CONTROLLED WITH YOUR NAUSEA MEDICATION °*UNUSUAL SHORTNESS OF BREATH °*UNUSUAL BRUISING OR BLEEDING °*URINARY PROBLEMS (pain or burning when urinating, or frequent urination) °*BOWEL PROBLEMS (unusual diarrhea, constipation, pain near the anus) °TENDERNESS IN MOUTH AND THROAT WITH OR WITHOUT PRESENCE OF ULCERS (sore throat, sores in mouth, or a toothache) °UNUSUAL RASH, SWELLING OR PAIN  °UNUSUAL VAGINAL DISCHARGE OR ITCHING  ° °Items with * indicate a potential emergency and should be followed up as soon as possible or go to the Emergency Department if any problems should occur. ° °Please show the CHEMOTHERAPY ALERT CARD or IMMUNOTHERAPY ALERT CARD at check-in  to the Emergency Department and triage nurse. ° °Should you have questions after your visit or need to cancel or reschedule your appointment, please contact [facility]  Dept: [phone]  and follow the prompts.  Office hours are 8:00 a.m. to 4:30 p.m. Monday - Friday. Please note that voicemails left after 4:00 p.m. may not be returned until the following business day.  We are closed weekends and major holidays. You have access to a nurse at all times for urgent questions. Please call the main number to the clinic Dept: [phone] and follow the prompts. ° ° °For any non-urgent questions, you may also contact your provider using MyChart. We now offer e-Visits for anyone 18 and older to request care online for non-urgent symptoms. For details visit [email or web address]. °  °Also download the MyChart app! Go to the app store, search "MyChart", open the app, select , and log in with your MyChart username and password. ° °Due to Covid, a mask is required upon entering the hospital/clinic. If you do not have a mask, one will be given to you upon arrival. For doctor visits, patients may have 1 support person aged 18 or older with them. For treatment visits, patients cannot have anyone with them due to current Covid guidelines and our immunocompromised population.  ° °

## 2020-12-20 ENCOUNTER — Ambulatory Visit (INDEPENDENT_AMBULATORY_CARE_PROVIDER_SITE_OTHER): Payer: Medicare Other | Admitting: *Deleted

## 2020-12-20 DIAGNOSIS — Z0000 Encounter for general adult medical examination without abnormal findings: Secondary | ICD-10-CM | POA: Diagnosis not present

## 2020-12-20 DIAGNOSIS — I10 Essential (primary) hypertension: Secondary | ICD-10-CM

## 2020-12-20 MED ORDER — AMLODIPINE BESYLATE 10 MG PO TABS: 10.0000 mg | ORAL_TABLET | Freq: Every day | ORAL | 0 refills | Status: DC

## 2020-12-20 MED ORDER — METOPROLOL TARTRATE 50 MG PO TABS
50.0000 mg | ORAL_TABLET | Freq: Two times a day (BID) | ORAL | 0 refills | Status: DC
Start: 2020-12-20 — End: 2021-03-29

## 2020-12-20 NOTE — Progress Notes (Signed)
Subjective:   Mario Hubbard is a 70 y.o. male who presents for Medicare Annual/Subsequent preventive examination.  I connected with  Mario Hubbard on 12/20/20 by audio enabled telemedicine application and verified that I am speaking with the correct person using two identifiers.   I discussed the limitations of evaluation and management by telemedicine. The patient expressed understanding and agreed to proceed.   Location of patient: Home Location of Provider: Office Persons participating in visit: [name] (patient) & Jari Favre, CMA  Review of Systems    Defer to PCP Cardiac Risk Factors include: none     Objective:    There were no vitals filed for this visit. There is no height or weight on file to calculate BMI.  Advanced Directives 12/20/2020 09/27/2020 09/07/2020 07/05/2020 06/15/2020 05/23/2020 04/12/2020  Does Patient Have a Medical Advance Directive? No No No No No No No  Would patient like information on creating a medical advance directive? Yes (ED - Information included in AVS) No - Patient declined No - Patient declined No - Patient declined No - Patient declined No - Patient declined No - Patient declined    Current Medications (verified) Outpatient Encounter Medications as of 12/20/2020  Medication Sig   atorvastatin (LIPITOR) 10 MG tablet Take 1 tablet (10 mg total) by mouth daily.   indapamide (LOZOL) 2.5 MG tablet Take 1 tablet (2.5 mg total) by mouth daily.   losartan (COZAAR) 100 MG tablet Take 100 mg by mouth daily.   potassium gluconate 595 (99 K) MG TABS tablet Take 1 tablet (595 mg total) by mouth 2 (two) times daily.   sildenafil (VIAGRA) 50 MG tablet Take 50 mg by mouth daily as needed for erectile dysfunction.   [DISCONTINUED] amLODipine (NORVASC) 10 MG tablet Take 1 tablet (10 mg total) by mouth daily.   [DISCONTINUED] metoprolol tartrate (LOPRESSOR) 50 MG tablet Take 1 tablet (50 mg total) by mouth 2 (two) times daily.   amLODipine (NORVASC) 10 MG tablet Take 1  tablet (10 mg total) by mouth daily.   metoprolol tartrate (LOPRESSOR) 50 MG tablet Take 1 tablet (50 mg total) by mouth 2 (two) times daily.   No facility-administered encounter medications on file as of 12/20/2020.    Allergies (verified) Patient has no known allergies.   History: Past Medical History:  Diagnosis Date   Arthritis    Hypertension    No past surgical history on file. Family History  Problem Relation Age of Onset   Kidney disease Mother    Hypertension Mother    Healthy Sister    Diabetes Brother    Social History   Socioeconomic History   Marital status: Married    Spouse name: Not on file   Number of children: Not on file   Years of education: Not on file   Highest education level: Not on file  Occupational History   Not on file  Tobacco Use   Smoking status: Former   Smokeless tobacco: Never  Substance and Sexual Activity   Alcohol use: Never   Drug use: Never   Sexual activity: Not Currently    Partners: Female  Other Topics Concern   Not on file  Social History Narrative   Not on file   Social Determinants of Health   Financial Resource Strain: Low Risk    Difficulty of Paying Living Expenses: Not hard at all  Food Insecurity: No Food Insecurity   Worried About [place] in the Last Year: Never true  Ran Out of Food in the Last Year: Never true  Transportation Needs: No Transportation Needs   Lack of Transportation (Medical): No   Lack of Transportation (Non-Medical): No  Physical Activity: Sufficiently Active   Days of Exercise per Week: 5 days   Minutes of Exercise per Session: 60 min  Stress: No Stress Concern Present   Feeling of Stress : Not at all  Social Connections: Socially Integrated   Frequency of Communication with Friends and Family: More than three times a week   Frequency of Social Gatherings with Friends and Family: Once a week   Attends Religious Services: More than 4 times per year   Active Member of  [employer] or Organizations: Yes   Attends Music therapist: More than 4 times per year   Marital Status: Married    Tobacco Counseling Counseling given: Not Answered   Clinical Intake:     Pain : No/denies pain     BMI - recorded: 29.48 Nutritional Status: BMI 25 -29 Overweight Diabetes: No  How often do you need to have someone help you when you read instructions, pamphlets, or other written materials from your doctor or pharmacy?: 1 - Never  Diabetic? No  Interpreter Needed?: No      Activities of Daily Living In your present state of health, do you have any difficulty performing the following activities: 12/20/2020 04/04/2020  Hearing? N N  Vision? N N  Difficulty concentrating or making decisions? N N  Walking or climbing stairs? N N  Dressing or bathing? N N  Doing errands, shopping? N N  Preparing Food and eating ? N -  Using the Toilet? N -  In the past six months, have you accidently leaked urine? N -  Do you have problems with loss of bowel control? N -  Managing your Medications? N -  Managing your Finances? N -  Housekeeping or managing your Housekeeping? N -  Some recent data might be hidden    Patient Care Team: [name], MD as PCP - General (Internal Medicine)  Indicate any recent Medical Services you may have received from other than [facility] providers in the past year (date may be approximate).     Assessment:   This is a routine wellness examination for Trevius.  Hearing/Vision screen No results found.  Dietary issues and exercise activities discussed: Current Exercise Habits: Home exercise routine, Type of exercise: walking, Time (Minutes): 60, Frequency (Times/Week): 5, Weekly Exercise (Minutes/Week): 300, Intensity: Moderate   Goals Addressed   None    Depression Screen PHQ 2/9 Scores 12/20/2020 04/04/2020  PHQ - 2 Score 0 0    Fall Risk Fall Risk  12/20/2020  Falls in the past year? 0  Number falls in past yr: 0   Injury with Fall? 0    FALL RISK PREVENTION PERTAINING TO THE HOME:  Any stairs in or around the home? No  If so, are there any without handrails?  N/a Home free of loose throw rugs in walkways, pet beds, electrical cords, etc? No  Adequate lighting in your home to reduce risk of falls? Yes   ASSISTIVE DEVICES UTILIZED TO PREVENT FALLS:  Life alert? No  Use of a cane, walker or w/c? No  Grab bars in the bathroom? No  Shower chair or bench in shower? No  Elevated toilet seat or a handicapped toilet? No   TIMED UP AND GO:  Was the test performed?  N/A .  Length of time to  ambulate 10 feet: N/A sec.   Appearance of Gait: Unable to access  Cognitive Function:     6CIT Screen 12/20/2020  What Year? 0 points  What month? 0 points  What time? 0 points  Count back from 20 0 points  Months in reverse 0 points  Repeat phrase 2 points  Total Score 2    Immunizations Immunization History  Administered Date(s) Administered   Influenza-Unspecified 01/05/2020   PFIZER(Purple Top)SARS-COV-2 Vaccination 05/20/2019, 06/17/2019, 03/22/2020   Pneumococcal Conjugate-13 10/09/2016   Pneumococcal Polysaccharide-23 01/17/2018   Tdap 05/14/2017    TDAP status: Up to date  Flu Vaccine status: Due, Education has been provided regarding the importance of this vaccine. Advised may receive this vaccine at local pharmacy or Health Dept. Aware to provide a copy of the vaccination record if obtained from local pharmacy or Health Dept. Verbalized acceptance and understanding.  Pneumococcal vaccine status: Up to date  Covid-19 vaccine status: Information provided on how to obtain vaccines.   Qualifies for Shingles Vaccine? Yes   Zostavax completed No   Shingrix Completed?: No.    Education has been provided regarding the importance of this vaccine. Patient has been advised to call insurance company to determine out of pocket expense if they have not yet received this vaccine. Advised may also  receive vaccine at local pharmacy or Health Dept. Verbalized acceptance and understanding.  Screening Tests Health Maintenance  Topic Date Due   COVID-19 Vaccine (4 - Booster for Pfizer series) 01/05/2021 (Originally 06/14/2020)   Zoster Vaccines- Shingrix (1 of 2) 03/21/2021 (Originally 06/25/1969)   INFLUENZA VACCINE  07/07/2021 (Originally 11/07/2020)   Hepatitis C Screening  12/20/2021 (Originally 06/26/1968)   COLONOSCOPY (Pts 45-10yr Insurance coverage will need to be confirmed)  10/19/2024   TETANUS/TDAP  05/15/2027   PNA vac Low Risk Adult  Completed   HPV VACCINES  Aged Out    Health Maintenance  There are no preventive care reminders to display for this patient.   Colorectal cancer screening: Type of screening: Colonoscopy. Completed 10/20/2014. Repeat every 10 years  Lung Cancer Screening: (Low Dose CT Chest recommended if Age 71-80years, 30 pack-year currently smoking OR have quit w/in 15years.) does not qualify.    Additional Screening:  Hepatitis C Screening: does qualify; Not completed   Vision Screening: Recommended annual ophthalmology exams for early detection of glaucoma and other disorders of the eye. Is the patient up to date with their annual eye exam?  Yes  Who is the provider or what is the name of the office in which the patient attends annual eye exams? [facility] If pt is not established with a provider, would they like to be referred to a provider to establish care? No .   Dental Screening: Recommended annual dental exams for proper oral hygiene  Community Resource Referral / Chronic Care Management: CRR required this visit?  No   CCM required this visit?  No      Plan:     I have personally reviewed and noted the following in the patient's chart:   Medical and social history Use of alcohol, tobacco or illicit drugs  Current medications and supplements including opioid prescriptions. Patient is not currently taking opioid  prescriptions. Functional ability and status Nutritional status Physical activity Advanced directives List of other physicians Hospitalizations, surgeries, and ER visits in previous 12 months Vitals Screenings to include cognitive, depression, and falls Referrals and appointments  In addition, I have reviewed and discussed with patient certain preventive  protocols, quality metrics, and best practice recommendations. A written personalized care plan for preventive services as well as general preventive health recommendations were provided to patient.     Cannon Kettle, Bud   12/20/2020   Nurse Notes: 15 minutes non face to face   Mr. Ellman , Thank you for taking time to come for your Medicare Wellness Visit. I appreciate your ongoing commitment to your health goals. Please review the following plan we discussed and let me know if I can assist you in the future.   These are the goals we discussed:  Goals   None     This is a list of the screening recommended for you and due dates:  Health Maintenance  Topic Date Due   COVID-19 Vaccine (4 - Booster for Pfizer series) 01/05/2021*   Zoster (Shingles) Vaccine (1 of 2) 03/21/2021*   Flu Shot  07/07/2021*   Hepatitis C Screening: USPSTF Recommendation to screen - Ages 18-79 yo.  12/20/2021*   Colon Cancer Screening  10/19/2024   Tetanus Vaccine  05/15/2027   Pneumonia vaccines  Completed   HPV Vaccine  Aged Out  *Topic was postponed. The date shown is not the original due date.

## 2020-12-20 NOTE — Patient Instructions (Signed)
Health Maintenance, Male Adopting a healthy lifestyle and getting preventive care are important in promoting health and wellness. Ask your health care provider about: The right schedule for you to have regular tests and exams. Things you can do on your own to prevent diseases and keep yourself healthy. What should I know about diet, weight, and exercise? Eat a healthy diet  Eat a diet that includes plenty of vegetables, fruits, low-fat dairy products, and lean protein. Do not eat a lot of foods that are high in solid fats, added sugars, or sodium. Maintain a healthy weight Body mass index (BMI) is a measurement that can be used to identify possible weight problems. It estimates body fat based on height and weight. Your health care provider can help determine your BMI and help you achieve or maintain a healthy weight. Get regular exercise Get regular exercise. This is one of the most important things you can do for your health. Most adults should: Exercise for at least 150 minutes each week. The exercise should increase your heart rate and make you sweat (moderate-intensity exercise). Do strengthening exercises at least twice a week. This is in addition to the moderate-intensity exercise. Spend less time sitting. Even light physical activity can be beneficial. Watch cholesterol and blood lipids Have your blood tested for lipids and cholesterol at 71 years of age, then have this test every 5 years. You may need to have your cholesterol levels checked more often if: Your lipid or cholesterol levels are high. You are older than 71 years of age. You are at high risk for heart disease. What should I know about cancer screening? Many types of cancers can be detected early and may often be prevented. Depending on your health history and family history, you may need to have cancer screening at various ages. This may include screening for: Colorectal cancer. Prostate cancer. Skin cancer. Lung  cancer. What should I know about heart disease, diabetes, and high blood pressure? Blood pressure and heart disease High blood pressure causes heart disease and increases the risk of stroke. This is more likely to develop in people who have high blood pressure readings, are of African descent, or are overweight. Talk with your health care provider about your target blood pressure readings. Have your blood pressure checked: Every 3-5 years if you are 18-39 years of age. Every year if you are 40 years old or older. If you are between the ages of 65 and 75 and are a current or former smoker, ask your health care provider if you should have a one-time screening for abdominal aortic aneurysm (AAA). Diabetes Have regular diabetes screenings. This checks your fasting blood sugar level. Have the screening done: Once every three years after age 45 if you are at a normal weight and have a low risk for diabetes. More often and at a younger age if you are overweight or have a high risk for diabetes. What should I know about preventing infection? Hepatitis B If you have a higher risk for hepatitis B, you should be screened for this virus. Talk with your health care provider to find out if you are at risk for hepatitis B infection. Hepatitis C Blood testing is recommended for: Everyone born from 1945 through 1965. Anyone with known risk factors for hepatitis C. Sexually transmitted infections (STIs) You should be screened each year for STIs, including gonorrhea and chlamydia, if: You are sexually active and are younger than 71 years of age. You are older than 71 years   of age and your health care provider tells you that you are at risk for this type of infection. Your sexual activity has changed since you were last screened, and you are at increased risk for chlamydia or gonorrhea. Ask your health care provider if you are at risk. Ask your health care provider about whether you are at high risk for HIV.  Your health care provider may recommend a prescription medicine to help prevent HIV infection. If you choose to take medicine to prevent HIV, you should first get tested for HIV. You should then be tested every 3 months for as long as you are taking the medicine. Follow these instructions at home: Lifestyle Do not use any products that contain nicotine or tobacco, such as cigarettes, e-cigarettes, and chewing tobacco. If you need help quitting, ask your health care provider. Do not use street drugs. Do not share needles. Ask your health care provider for help if you need support or information about quitting drugs. Alcohol use Do not drink alcohol if your health care provider tells you not to drink. If you drink alcohol: Limit how much you have to 0-2 drinks a day. Be aware of how much alcohol is in your drink. In the [place] one drink equals one 12 oz bottle of beer (355 mL), one 5 oz glass of wine (148 mL), or one 1 oz glass of hard liquor (44 mL). General instructions Schedule regular health, dental, and eye exams. Stay current with your vaccines. Tell your health care provider if: You often feel depressed. You have ever been abused or do not feel safe at home. Summary Adopting a healthy lifestyle and getting preventive care are important in promoting health and wellness. Follow your health care provider's instructions about healthy diet, exercising, and getting tested or screened for diseases. Follow your health care provider's instructions on monitoring your cholesterol and blood pressure. This information is not intended to replace advice given to you by your health care provider. Make sure you discuss any questions you have with your health care provider. Document Revised: 06/03/2020 Document Reviewed: 03/19/2018 Elsevier Patient Education  2022 [employer] Inc.  

## 2020-12-21 ENCOUNTER — Inpatient Hospital Stay (HOSPITAL_BASED_OUTPATIENT_CLINIC_OR_DEPARTMENT_OTHER): Payer: Medicare Other | Admitting: Oncology

## 2020-12-21 ENCOUNTER — Inpatient Hospital Stay: Payer: Medicare Other | Attending: Oncology

## 2020-12-21 ENCOUNTER — Inpatient Hospital Stay: Payer: Medicare Other

## 2020-12-21 ENCOUNTER — Other Ambulatory Visit: Payer: Self-pay

## 2020-12-21 VITALS — BP 125/67 | HR 68 | Temp 96.7°F | Resp 19 | Ht 69.0 in | Wt 199.4 lb

## 2020-12-21 DIAGNOSIS — C609 Malignant neoplasm of penis, unspecified: Secondary | ICD-10-CM

## 2020-12-21 DIAGNOSIS — Z79899 Other long term (current) drug therapy: Secondary | ICD-10-CM | POA: Diagnosis not present

## 2020-12-21 DIAGNOSIS — Z923 Personal history of irradiation: Secondary | ICD-10-CM | POA: Diagnosis not present

## 2020-12-21 DIAGNOSIS — C774 Secondary and unspecified malignant neoplasm of inguinal and lower limb lymph nodes: Secondary | ICD-10-CM | POA: Insufficient documentation

## 2020-12-21 DIAGNOSIS — C680 Malignant neoplasm of urethra: Secondary | ICD-10-CM

## 2020-12-21 DIAGNOSIS — Z5112 Encounter for antineoplastic immunotherapy: Secondary | ICD-10-CM | POA: Insufficient documentation

## 2020-12-21 DIAGNOSIS — Z95828 Presence of other vascular implants and grafts: Secondary | ICD-10-CM

## 2020-12-21 DIAGNOSIS — E032 Hypothyroidism due to medicaments and other exogenous substances: Secondary | ICD-10-CM

## 2020-12-21 LAB — CMP (CANCER CENTER ONLY)
ALT: 37 U/L (ref 0–44)
AST: 36 U/L (ref 15–41)
Albumin: 3.8 g/dL (ref 3.5–5.0)
Alkaline Phosphatase: 74 U/L (ref 38–126)
Anion gap: 11 (ref 5–15)
BUN: 23 mg/dL (ref 8–23)
CO2: 28 mmol/L (ref 22–32)
Calcium: 9.7 mg/dL (ref 8.9–10.3)
Chloride: 102 mmol/L (ref 98–111)
Creatinine: 1.09 mg/dL (ref 0.61–1.24)
GFR, Estimated: >60 mL/min (ref >60)
Glucose, Bld: 102 mg/dL — ABNORMAL HIGH (ref 70–99)
Potassium: 3.1 mmol/L — ABNORMAL LOW (ref 3.5–5.1)
Sodium: 141 mmol/L (ref 135–145)
Total Bilirubin: 0.3 mg/dL (ref 0.3–1.2)
Total Protein: 7.1 g/dL (ref 6.5–8.1)

## 2020-12-21 LAB — CBC WITH DIFFERENTIAL (CANCER CENTER ONLY)
Abs Immature Granulocytes: 0 10*3/uL (ref 0.00–0.07)
Basophils Absolute: 0 10*3/uL (ref 0.0–0.1)
Basophils Relative: 0 %
Eosinophils Absolute: 0.4 10*3/uL (ref 0.0–0.5)
Eosinophils Relative: 6 %
HCT: 37.4 % — ABNORMAL LOW (ref 39.0–52.0)
Hemoglobin: 12.2 g/dL — ABNORMAL LOW (ref 13.0–17.0)
Immature Granulocytes: 0 %
Lymphocytes Relative: 31 %
Lymphs Abs: 2 10*3/uL (ref 0.7–4.0)
MCH: 28.3 pg (ref 26.0–34.0)
MCHC: 32.6 g/dL (ref 30.0–36.0)
MCV: 86.8 fL (ref 80.0–100.0)
Monocytes Absolute: 0.6 10*3/uL (ref 0.1–1.0)
Monocytes Relative: 9 %
Neutro Abs: 3.4 10*3/uL (ref 1.7–7.7)
Neutrophils Relative %: 54 %
Platelet Count: 189 10*3/uL (ref 150–400)
RBC: 4.31 MIL/uL (ref 4.22–5.81)
RDW: 14.1 % (ref 11.5–15.5)
WBC Count: 6.3 10*3/uL (ref 4.0–10.5)
nRBC: 0 % (ref 0.0–0.2)

## 2020-12-21 LAB — TSH: TSH: 1.686 u[IU]/mL (ref 0.320–4.118)

## 2020-12-21 MED ORDER — SODIUM CHLORIDE 0.9% FLUSH
10.0000 mL | Freq: Once | INTRAVENOUS | Status: AC
  Administered 2020-12-21: 10 mL

## 2020-12-21 MED ORDER — SODIUM CHLORIDE 0.9 % IV SOLN: Freq: Once | INTRAVENOUS | Status: AC

## 2020-12-21 MED ORDER — SODIUM CHLORIDE 0.9 % IV SOLN
1200.0000 mg | Freq: Once | INTRAVENOUS | Status: AC
  Administered 2020-12-21: 1200 mg via INTRAVENOUS
  Filled 2020-12-21: qty 20

## 2020-12-21 MED ORDER — ALTEPLASE 2 MG IJ SOLR
2.0000 mg | Freq: Once | INTRAMUSCULAR | Status: AC
  Administered 2020-12-21: 2 mg
  Filled 2020-12-21: qty 2

## 2020-12-21 MED ORDER — SODIUM CHLORIDE 0.9% FLUSH
10.0000 mL | INTRAVENOUS | Status: DC | PRN
  Administered 2020-12-21: 10 mL

## 2020-12-21 MED ORDER — HEPARIN SOD (PORK) LOCK FLUSH 100 UNIT/ML IV SOLN
500.0000 [IU] | Freq: Once | INTRAVENOUS | Status: AC | PRN
  Administered 2020-12-21: 500 [IU]

## 2020-12-21 NOTE — Patient Instructions (Signed)
[facility]  Discharge Instructions: °Thank you for choosing [facility] to provide your oncology and hematology care.  ° °If you have a lab appointment with the Cancer Center, please go directly to the Cancer Center and check in at the registration area. °  °Wear comfortable clothing and clothing appropriate for easy access to any Portacath or PICC line.  ° °We strive to give you quality time with your provider. You may need to reschedule your appointment if you arrive late (15 or more minutes).  Arriving late affects you and other patients whose appointments are after yours.  Also, if you miss three or more appointments without notifying the office, you may be dismissed from the clinic at the provider’s discretion.    °  °For prescription refill requests, have your pharmacy contact our office and allow 72 hours for refills to be completed.   ° °Today you received the following chemotherapy and/or immunotherapy agents: Tecentriq.     °  °To help prevent nausea and vomiting after your treatment, we encourage you to take your nausea medication as directed. ° °BELOW ARE SYMPTOMS THAT SHOULD BE REPORTED IMMEDIATELY: °*FEVER GREATER THAN 100.4 F (38 °C) OR HIGHER °*CHILLS OR SWEATING °*NAUSEA AND VOMITING THAT IS NOT CONTROLLED WITH YOUR NAUSEA MEDICATION °*UNUSUAL SHORTNESS OF BREATH °*UNUSUAL BRUISING OR BLEEDING °*URINARY PROBLEMS (pain or burning when urinating, or frequent urination) °*BOWEL PROBLEMS (unusual diarrhea, constipation, pain near the anus) °TENDERNESS IN MOUTH AND THROAT WITH OR WITHOUT PRESENCE OF ULCERS (sore throat, sores in mouth, or a toothache) °UNUSUAL RASH, SWELLING OR PAIN  °UNUSUAL VAGINAL DISCHARGE OR ITCHING  ° °Items with * indicate a potential emergency and should be followed up as soon as possible or go to the Emergency Department if any problems should occur. ° °Please show the CHEMOTHERAPY ALERT CARD or IMMUNOTHERAPY ALERT CARD at check-in  to the Emergency Department and triage nurse. ° °Should you have questions after your visit or need to cancel or reschedule your appointment, please contact [facility]  Dept: [phone]  and follow the prompts.  Office hours are 8:00 a.m. to 4:30 p.m. Monday - Friday. Please note that voicemails left after 4:00 p.m. may not be returned until the following business day.  We are closed weekends and major holidays. You have access to a nurse at all times for urgent questions. Please call the main number to the clinic Dept: [phone] and follow the prompts. ° ° °For any non-urgent questions, you may also contact your provider using MyChart. We now offer e-Visits for anyone 18 and older to request care online for non-urgent symptoms. For details visit [email or web address]. °  °Also download the MyChart app! Go to the app store, search "MyChart", open the app, select [facility], and log in with your MyChart username and password. ° °Due to Covid, a mask is required upon entering the hospital/clinic. If you do not have a mask, one will be given to you upon arrival. For doctor visits, patients may have 1 support person aged 18 or older with them. For treatment visits, patients cannot have anyone with them due to current Covid guidelines and our immunocompromised population.  ° °

## 2020-12-21 NOTE — Progress Notes (Signed)
Hematology and Oncology Follow Up Visit  Tanyon Sams [id number] 23-Mar-1951 70 y.o. 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  He developed stage IV disease with pelvic adenopathy in 2017.     Prior Therapy: He underwent surgical resection followed by radiation therapy in 2015.  He subsequently developed metastatic disease with right-sided pelvic lymph node and confirmed the presence of relapsed disease that is PET avid in 2017.   He was treated with carboplatin and Taxol initially with excellent response after 3 cycles. 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   He did show worsening disease in October 2018 and subsequently was started on Tecentriq. He relocated to [place] and received Tecentriq maintenance therapy with excellent response. CT scan in August 2021 continues to show no evidence of disease.  Current therapy: Tecentriq 1200 mg every 3 weeks started in 2018 while living in [place].  He is receiving it locally started on February 09, 2020.  He is here for the next cycle of therapy.  Interim History: Mr. Rivett returns today for repeat evaluation.  Since last visit, he reports feeling well without any major complaints.  He denies any nausea, fatigue or changes in his bowel habits.  He denies any abdominal pain or diarrhea.  He denies any skin rash, lesions or pruritus.  He denies any excessive fatigue.  Continues to be active and attends to activities of daily.    Medications: Reviewed without changes. Current Outpatient Medications  Medication Sig Dispense Refill   amLODipine (NORVASC) 10 MG tablet Take 1 tablet (10 mg total) by mouth daily. 90 tablet 0   atorvastatin (LIPITOR) 10 MG tablet Take 1 tablet (10 mg total) by mouth daily. 90 tablet 1   indapamide (LOZOL) 2.5 MG tablet Take 1 tablet (2.5 mg  total) by mouth daily. 90 tablet 1   losartan (COZAAR) 100 MG tablet Take 100 mg by mouth daily.     metoprolol tartrate (LOPRESSOR) 50 MG tablet Take 1 tablet (50 mg total) by mouth 2 (two) times daily. 180 tablet 0   potassium gluconate 595 (99 K) MG TABS tablet Take 1 tablet (595 mg total) by mouth 2 (two) times daily. 180 tablet 1   sildenafil (VIAGRA) 50 MG tablet Take 50 mg by mouth daily as needed for erectile dysfunction.     No current facility-administered medications for this visit.     Allergies: No Known Allergies    Physical Exam:       Blood pressure 125/67, pulse 68, temperature (!) 96.7 F (35.9 C), temperature source Tympanic, resp. rate 19, height '5\' 9"'$  (1.753 m), weight 199 lb 6.4 oz (90.4 kg), SpO2 99 %.      ECOG: 0     General appearance: Alert, awake without any distress. Head: Atraumatic without abnormalities Oropharynx: Without any thrush or ulcers. Eyes: No scleral icterus. Lymph nodes: No lymphadenopathy noted in the cervical, supraclavicular, or axillary nodes Heart:regular rate and rhythm, without any murmurs or gallops.   Lung: Clear to auscultation without any rhonchi, wheezes or dullness to percussion. Abdomin: Soft, nontender without any shifting dullness or ascites. Musculoskeletal: No clubbing or cyanosis. Neurological: No motor or sensory deficits. Skin: No rashes or lesions.                  Lab Results: Lab Results  Component Value Date   WBC  6.8 11/30/2020   HGB 12.9 (L) 11/30/2020   HCT 38.3 (L) 11/30/2020   MCV 85.3 11/30/2020   PLT 187 11/30/2020     Chemistry      Component Value Date/Time   NA 140 11/30/2020 0747   K 3.1 (L) 11/30/2020 0747   CL 103 11/30/2020 0747   CO2 25 11/30/2020 0747   BUN 22 11/30/2020 0747   CREATININE 1.13 11/30/2020 0747      Component Value Date/Time   CALCIUM 9.6 11/30/2020 0747   ALKPHOS 76 11/30/2020 0747   AST 29 11/30/2020 0747   ALT 26 11/30/2020 0747    BILITOT 0.4 11/30/2020 0747        Impression and Plan:  70 year old with:   1.  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    He continues to be on immunotherapy utilizing Tecentriq without any major complications.  Risks and benefits of continuing this treatment were discussed at this time.  Repeat imaging studies will be done in December 2022.  Consideration for therapy discontinuation would be discussed after that scan if he continues to have a complete response to therapy.  He is agreeable with this plan and has no objection continuing treatment for the time being.  2. IV access: Port-A-Cath currently in use without any issues.   3. Antiemetics: Compazine is available to him without any nausea or vomiting.   4.  Immune mediated complications.  He has not experienced any pneumonitis, colitis, hepatitis we will continue to monitor his thyroid disease.  Continue to educate him about potential complications.   5. Follow-up: 3 weeks for repeat follow-up.   30  minutes were dedicated to this encounter.  The time spent on reviewing laboratory data, disease status update, treatment choices and future plan of care discussion.   Zola Button, MD 9/14/20227:56 AM

## 2020-12-27 ENCOUNTER — Other Ambulatory Visit: Payer: Self-pay

## 2020-12-28 ENCOUNTER — Ambulatory Visit (INDEPENDENT_AMBULATORY_CARE_PROVIDER_SITE_OTHER): Payer: Medicare Other | Admitting: Internal Medicine

## 2020-12-28 ENCOUNTER — Encounter: Payer: Self-pay | Admitting: Internal Medicine

## 2020-12-28 VITALS — BP 142/82 | HR 55 | Temp 98.1°F | Resp 16 | Ht 69.0 in | Wt 201.0 lb

## 2020-12-28 DIAGNOSIS — D539 Nutritional anemia, unspecified: Secondary | ICD-10-CM

## 2020-12-28 DIAGNOSIS — Z23 Encounter for immunization: Secondary | ICD-10-CM | POA: Diagnosis not present

## 2020-12-28 DIAGNOSIS — E876 Hypokalemia: Secondary | ICD-10-CM | POA: Diagnosis not present

## 2020-12-28 DIAGNOSIS — I10 Essential (primary) hypertension: Secondary | ICD-10-CM | POA: Diagnosis not present

## 2020-12-28 LAB — BASIC METABOLIC PANEL
BUN: 17 mg/dL (ref 6–23)
CO2: 30 mEq/L (ref 19–32)
Calcium: 9.7 mg/dL (ref 8.4–10.5)
Chloride: 101 mEq/L (ref 96–112)
Creatinine, Ser: 1.03 mg/dL (ref 0.40–1.50)
GFR: 73.08 mL/min (ref >60.00)
Glucose, Bld: 88 mg/dL (ref 70–99)
Potassium: 3.3 mEq/L — ABNORMAL LOW (ref 3.5–5.1)
Sodium: 138 mEq/L (ref 135–145)

## 2020-12-28 LAB — MAGNESIUM: Magnesium: 1.6 mg/dL (ref 1.5–2.5)

## 2020-12-28 LAB — IBC + FERRITIN
Ferritin: 722.5 ng/mL — ABNORMAL HIGH (ref 22.0–322.0)
Iron: 82 ug/dL (ref 42–165)
Saturation Ratios: 29.3 % (ref 20.0–50.0)
TIBC: 280 ug/dL (ref 250.0–450.0)
Transferrin: 200 mg/dL — ABNORMAL LOW (ref 212.0–360.0)

## 2020-12-28 LAB — FOLATE: Folate: 9.4 ng/mL (ref >5.9)

## 2020-12-28 LAB — VITAMIN B12: Vitamin B-12: 269 pg/mL (ref 211–911)

## 2020-12-28 MED ORDER — POTASSIUM GLUCONATE 595 (99 K) MG PO TABS: 595.0000 mg | ORAL_TABLET | Freq: Two times a day (BID) | ORAL | 1 refills | Status: DC

## 2020-12-28 NOTE — Patient Instructions (Signed)
Anemia Anemia is a condition in which there is not enough red blood cells or hemoglobin in the blood. Hemoglobin is a substance in red blood cells that carries oxygen. When you do not have enough red blood cells or hemoglobin (are anemic), your body cannot get enough oxygen and your organs may not work properly. As a result, you may feel very tired or have other problems. What are the causes? Common causes of anemia include: Excessive bleeding. Anemia can be caused by excessive bleeding inside or outside the body, including bleeding from the intestines or from heavy menstrual periods in females. Poor nutrition. Long-lasting (chronic) kidney, thyroid, and liver disease. Bone marrow disorders, spleen problems, and blood disorders. Cancer and treatments for cancer. HIV (human immunodeficiency virus) and AIDS (acquired immunodeficiency syndrome). Infections, medicines, and autoimmune disorders that destroy red blood cells. What are the signs or symptoms? Symptoms of this condition include: Minor weakness. Dizziness. Headache, or difficulties concentrating and sleeping. Heartbeats that feel irregular or faster than normal (palpitations). Shortness of breath, especially with exercise. Pale skin, lips, and nails, or cold hands and feet. Indigestion and nausea. Symptoms may occur suddenly or develop slowly. If your anemia is mild, you may not have symptoms. How is this diagnosed? This condition is diagnosed based on blood tests, your medical history, and a physical exam. In some cases, a test may be needed in which cells are removed from the soft tissue inside of a bone and looked at under a microscope (bone marrow biopsy). Your health care provider may also check your stool (feces) for blood and may do additional testing to look for the cause of your bleeding. Other tests may include: Imaging tests, such as a CT scan or MRI. A procedure to see inside your esophagus and stomach (endoscopy). A  procedure to see inside your colon and rectum (colonoscopy). How is this treated? Treatment for this condition depends on the cause. If you continue to lose a lot of blood, you may need to be treated at a hospital. Treatment may include: Taking supplements of iron, vitamin B12, or folic acid. Taking a hormone medicine (erythropoietin) that can help to stimulate red blood cell growth. Having a blood transfusion. This may be needed if you lose a lot of blood. Making changes to your diet. Having surgery to remove your spleen. Follow these instructions at home: Take over-the-counter and prescription medicines only as told by your health care provider. Take supplements only as told by your health care provider. Follow any diet instructions that you were given by your health care provider. Keep all follow-up visits as told by your health care provider. This is important. Contact a health care provider if: You develop new bleeding anywhere in the body. Get help right away if: You are very weak. You are short of breath. You have pain in your abdomen or chest. You are dizzy or feel faint. You have trouble concentrating. You have bloody stools, black stools, or tarry stools. You vomit repeatedly or you vomit up blood. These symptoms may represent a serious problem that is an emergency. Do not wait to see if the symptoms will go away. Get medical help right away. Call your local emergency services (911 in the [place] Do not drive yourself to the hospital. Summary Anemia is a condition in which you do not have enough red blood cells or enough of a substance in your red blood cells that carries oxygen (hemoglobin). Symptoms may occur suddenly or develop slowly. If your anemia is   mild, you may not have symptoms. This condition is diagnosed with blood tests, a medical history, and a physical exam. Other tests may be needed. Treatment for this condition depends on the cause of the anemia. This  information is not intended to replace advice given to you by your health care provider. Make sure you discuss any questions you have with your health care provider. Document Revised: 03/03/2019 Document Reviewed: 03/03/2019 Elsevier Patient Education  2022 [employer].  

## 2020-12-28 NOTE — Progress Notes (Signed)
Subjective:  Patient ID: Mario Hubbard, male    DOB: 1950/06/12  Age: 70 y.o. MRN: [id number]  CC: Hypertension  This visit occurred during the SARS-CoV-2 public health emergency.  Safety protocols were in place, including screening questions prior to the visit, additional usage of staff PPE, and extensive cleaning of exam room while observing appropriate contact time as indicated for disinfecting solutions.    HPI Mario Hubbard presents for f/up -   He walks 2 miles a day and does not experience chest pain, shortness of breath, diaphoresis, or dizziness.  He tells me that compliance with his antihypertensives is not perfect.  Outpatient Medications Prior to Visit  Medication Sig Dispense Refill   amLODipine (NORVASC) 10 MG tablet Take 1 tablet (10 mg total) by mouth daily. 90 tablet 0   atorvastatin (LIPITOR) 10 MG tablet Take 1 tablet (10 mg total) by mouth daily. 90 tablet 1   indapamide (LOZOL) 2.5 MG tablet Take 1 tablet (2.5 mg total) by mouth daily. 90 tablet 1   losartan (COZAAR) 100 MG tablet Take 100 mg by mouth daily.     metoprolol tartrate (LOPRESSOR) 50 MG tablet Take 1 tablet (50 mg total) by mouth 2 (two) times daily. 180 tablet 0   sildenafil (VIAGRA) 50 MG tablet Take 50 mg by mouth daily as needed for erectile dysfunction.     potassium gluconate 595 (99 K) MG TABS tablet Take 1 tablet (595 mg total) by mouth 2 (two) times daily. 180 tablet 1   No facility-administered medications prior to visit.    ROS Review of Systems  Constitutional:  Negative for chills, diaphoresis, fatigue and fever.  HENT: Negative.    Eyes: Negative.   Respiratory:  Negative for cough, chest tightness, shortness of breath and wheezing.   Cardiovascular:  Negative for chest pain, palpitations and leg swelling.  Gastrointestinal:  Negative for abdominal pain, constipation, diarrhea, nausea and vomiting.  Endocrine: Negative.   Genitourinary: Negative.  Negative for difficulty urinating.   Musculoskeletal:  Negative for arthralgias and myalgias.  Skin: Negative.  Negative for color change.  Neurological: Negative.  Negative for dizziness, weakness, light-headedness and headaches.  Hematological:  Negative for adenopathy. Does not bruise/bleed easily.  Psychiatric/Behavioral: Negative.     Objective:  BP (!) 142/82 (BP Location: Left Arm, Patient Position: Sitting, Cuff Size: Large)   Pulse (!) 55   Temp 98.1 F (36.7 C) (Oral)   Resp 16   Ht 5\' 9"  (1.753 m)   Wt 201 lb (91.2 kg)   SpO2 95%   BMI 29.68 kg/m   BP Readings from Last 3 Encounters:  12/28/20 (!) 142/82  12/21/20 125/67  11/30/20 (!) 127/59    Wt Readings from Last 3 Encounters:  12/28/20 201 lb (91.2 kg)  12/21/20 199 lb 6.4 oz (90.4 kg)  11/30/20 199 lb 11.2 oz (90.6 kg)    Physical Exam Vitals reviewed.  HENT:     Nose: Nose normal.     Mouth/Throat:     Mouth: Mucous membranes are moist.  Eyes:     General: No scleral icterus.    Conjunctiva/sclera: Conjunctivae normal.  Cardiovascular:     Rate and Rhythm: Normal rate and regular rhythm.     Heart sounds: No murmur heard. Pulmonary:     Effort: Pulmonary effort is normal.     Breath sounds: No stridor. No wheezing, rhonchi or rales.  Abdominal:     General: Abdomen is flat.     Palpations: There  is no mass.     Tenderness: There is no abdominal tenderness. There is no guarding.     Hernia: No hernia is present.  Musculoskeletal:        General: Normal range of motion.     Cervical back: Neck supple.     Right lower leg: No edema.     Left lower leg: No edema.  Skin:    General: Skin is warm and dry.  Neurological:     General: No focal deficit present.     Mental Status: He is alert.  Psychiatric:        Mood and Affect: Mood normal.        Behavior: Behavior normal.    Lab Results  Component Value Date   WBC 6.7 01/02/2021   HGB 13.4 01/02/2021   HCT 40.0 01/02/2021   PLT 211.0 01/02/2021   GLUCOSE 88 12/28/2020    CHOL 174 04/04/2020   TRIG 75.0 04/04/2020   HDL 46.30 04/04/2020   LDLCALC 113 (H) 04/04/2020   ALT 37 12/21/2020   AST 36 12/21/2020   NA 138 12/28/2020   K 3.3 (L) 12/28/2020   CL 101 12/28/2020   CREATININE 1.03 12/28/2020   BUN 17 12/28/2020   CO2 30 12/28/2020   TSH 1.686 12/21/2020   PSA 3.17 05/16/2020    CT ABDOMEN PELVIS WO CONTRAST  Result Date: 09/21/2020 CLINICAL DATA:  Evaluate urethral cancer EXAM: CT CHEST, ABDOMEN AND PELVIS WITHOUT CONTRAST TECHNIQUE: Multidetector CT imaging of the chest, abdomen and pelvis was performed following the standard protocol without IV contrast. Oral enteric contrast was administered. COMPARISON:  CT chest abdomen pelvis, 03/21/2020 FINDINGS: CT CHEST FINDINGS Cardiovascular: Left chest port catheter. Aortic atherosclerosis. Normal heart size. Three-vessel coronary artery calcifications. No pericardial effusion. Mediastinum/Nodes: No enlarged mediastinal, hilar, or axillary lymph nodes. Thyroid gland, trachea, and esophagus demonstrate no significant findings. Lungs/Pleura: Mild centrilobular and paraseptal emphysema. Scattered areas of bandlike scarring, for example in the right apex (series 6, image 43). No pleural effusion or pneumothorax. Musculoskeletal: No chest wall mass or suspicious bone lesions identified. CT ABDOMEN PELVIS FINDINGS Hepatobiliary: No solid liver abnormality is seen. No gallstones, gallbladder wall thickening, or biliary dilatation. Pancreas: Unremarkable. No pancreatic ductal dilatation or surrounding inflammatory changes. Spleen: Normal in size without significant abnormality. Adrenals/Urinary Tract: Adrenal glands are unremarkable. Kidneys are normal, without renal calculi, solid lesion, or hydronephrosis. Bladder is unremarkable. Stomach/Bowel: Stomach is within normal limits. Appendix appears normal. No evidence of bowel wall thickening, distention, or inflammatory changes. Descending colonic diverticulosis.  Vascular/Lymphatic: Aortic atherosclerosis. No enlarged abdominal or pelvic lymph nodes. Reproductive: Mild prostatomegaly. Other: Small, fat containing umbilical hernia. No abdominopelvic ascites. Surgical clips in the left groin. Musculoskeletal: No acute or significant osseous findings. IMPRESSION: 1. No noncontrast evidence of mass, lymphadenopathy, or metastatic disease in the chest, abdomen, or pelvis. 2. Prostatomegaly. 3. Diverticulosis without evidence of acute diverticulitis. 4. Emphysema. 5. Coronary artery disease. Aortic Atherosclerosis (ICD10-I70.0) and Emphysema (ICD10-J43.9). Electronically Signed   By: Eddie Candle M.D.   On: 09/21/2020 12:47   CT Chest Wo Contrast  Result Date: 09/21/2020 CLINICAL DATA:  Evaluate urethral cancer EXAM: CT CHEST, ABDOMEN AND PELVIS WITHOUT CONTRAST TECHNIQUE: Multidetector CT imaging of the chest, abdomen and pelvis was performed following the standard protocol without IV contrast. Oral enteric contrast was administered. COMPARISON:  CT chest abdomen pelvis, 03/21/2020 FINDINGS: CT CHEST FINDINGS Cardiovascular: Left chest port catheter. Aortic atherosclerosis. Normal heart size. Three-vessel coronary artery calcifications. No pericardial  effusion. Mediastinum/Nodes: No enlarged mediastinal, hilar, or axillary lymph nodes. Thyroid gland, trachea, and esophagus demonstrate no significant findings. Lungs/Pleura: Mild centrilobular and paraseptal emphysema. Scattered areas of bandlike scarring, for example in the right apex (series 6, image 43). No pleural effusion or pneumothorax. Musculoskeletal: No chest wall mass or suspicious bone lesions identified. CT ABDOMEN PELVIS FINDINGS Hepatobiliary: No solid liver abnormality is seen. No gallstones, gallbladder wall thickening, or biliary dilatation. Pancreas: Unremarkable. No pancreatic ductal dilatation or surrounding inflammatory changes. Spleen: Normal in size without significant abnormality. Adrenals/Urinary  Tract: Adrenal glands are unremarkable. Kidneys are normal, without renal calculi, solid lesion, or hydronephrosis. Bladder is unremarkable. Stomach/Bowel: Stomach is within normal limits. Appendix appears normal. No evidence of bowel wall thickening, distention, or inflammatory changes. Descending colonic diverticulosis. Vascular/Lymphatic: Aortic atherosclerosis. No enlarged abdominal or pelvic lymph nodes. Reproductive: Mild prostatomegaly. Other: Small, fat containing umbilical hernia. No abdominopelvic ascites. Surgical clips in the left groin. Musculoskeletal: No acute or significant osseous findings. IMPRESSION: 1. No noncontrast evidence of mass, lymphadenopathy, or metastatic disease in the chest, abdomen, or pelvis. 2. Prostatomegaly. 3. Diverticulosis without evidence of acute diverticulitis. 4. Emphysema. 5. Coronary artery disease. Aortic Atherosclerosis (ICD10-I70.0) and Emphysema (ICD10-J43.9). Electronically Signed   By: Eddie Candle M.D.   On: 09/21/2020 12:47    Assessment & Plan:   Mario Hubbard was seen today for hypertension.  Diagnoses and all orders for this visit:  Deficiency anemia- His H&H are normal now.  I will screen him for vitamin deficiencies. -     CBC with Differential/Platelet; Future -     IBC + Ferritin; Future -     Vitamin B12; Future -     Vitamin B1; Future -     Zinc; Future -     Folate; Future -     Folate -     Zinc -     Vitamin B1 -     Vitamin B12 -     IBC + Ferritin -     CBC with Differential/Platelet -     CBC with Differential/Platelet; Future  Primary hypertension- His blood pressure is not adequately well controlled.  He will improve compliance with his antihypertensives and will continue working on his lifestyle modifications. -     Basic metabolic panel; Future -     Magnesium; Future -     potassium gluconate 595 (99 K) MG TABS tablet; Take 1 tablet (595 mg total) by mouth 2 (two) times daily. -     Magnesium -     Basic metabolic  panel  Chronic hypokalemia- Will restart the potassium supplement. -     Basic metabolic panel; Future -     Magnesium; Future -     potassium gluconate 595 (99 K) MG TABS tablet; Take 1 tablet (595 mg total) by mouth 2 (two) times daily. -     Magnesium -     Basic metabolic panel  Flu vaccine need -     Flu Vaccine QUAD High Dose(Fluad)  I am having Mario Hubbard maintain his sildenafil, losartan, indapamide, atorvastatin, metoprolol tartrate, amLODipine, and potassium gluconate.  Meds ordered this encounter  Medications   potassium gluconate 595 (99 K) MG TABS tablet    Sig: Take 1 tablet (595 mg total) by mouth 2 (two) times daily.    Dispense:  180 tablet    Refill:  1      Follow-up: Return in about 3 months (around 03/29/2021).  Scarlette Calico, MD

## 2020-12-30 ENCOUNTER — Encounter: Payer: Self-pay | Admitting: Oncology

## 2020-12-30 ENCOUNTER — Telehealth: Payer: Self-pay | Admitting: Oncology

## 2020-12-30 NOTE — Telephone Encounter (Signed)
Scheduled per los, patient has been called and notified of upcoming appointments. 

## 2021-01-02 ENCOUNTER — Other Ambulatory Visit: Payer: Medicare Other

## 2021-01-02 ENCOUNTER — Other Ambulatory Visit: Payer: Self-pay

## 2021-01-02 ENCOUNTER — Encounter: Payer: Self-pay | Admitting: Internal Medicine

## 2021-01-02 DIAGNOSIS — D539 Nutritional anemia, unspecified: Secondary | ICD-10-CM

## 2021-01-02 LAB — CBC WITH DIFFERENTIAL/PLATELET
Basophils Absolute: 0 10*3/uL (ref 0.0–0.1)
Basophils Relative: 0.5 % (ref 0.0–3.0)
Eosinophils Absolute: 0.4 10*3/uL (ref 0.0–0.7)
Eosinophils Relative: 5.8 % — ABNORMAL HIGH (ref 0.0–5.0)
HCT: 40 % (ref 39.0–52.0)
Hemoglobin: 13.4 g/dL (ref 13.0–17.0)
Lymphocytes Relative: 37.1 % (ref 12.0–46.0)
Lymphs Abs: 2.5 10*3/uL (ref 0.7–4.0)
MCHC: 33.4 g/dL (ref 30.0–36.0)
MCV: 85.1 fl (ref 78.0–100.0)
Monocytes Absolute: 0.6 10*3/uL (ref 0.1–1.0)
Monocytes Relative: 8.6 % (ref 3.0–12.0)
Neutro Abs: 3.2 10*3/uL (ref 1.4–7.7)
Neutrophils Relative %: 48 % (ref 43.0–77.0)
Platelets: 211 10*3/uL (ref 150.0–400.0)
RBC: 4.7 Mil/uL (ref 4.22–5.81)
RDW: 14.5 % (ref 11.5–15.5)
WBC: 6.7 10*3/uL (ref 4.0–10.5)

## 2021-01-02 LAB — VITAMIN B1: Vitamin B1 (Thiamine): 11 nmol/L (ref 8–30)

## 2021-01-02 LAB — ZINC: Zinc: 72 ug/dL (ref 60–130)

## 2021-01-08 ENCOUNTER — Other Ambulatory Visit: Payer: Self-pay | Admitting: Internal Medicine

## 2021-01-08 DIAGNOSIS — I10 Essential (primary) hypertension: Secondary | ICD-10-CM

## 2021-01-08 DIAGNOSIS — E876 Hypokalemia: Secondary | ICD-10-CM

## 2021-01-11 ENCOUNTER — Inpatient Hospital Stay: Payer: Medicare Other

## 2021-01-11 ENCOUNTER — Other Ambulatory Visit: Payer: Self-pay

## 2021-01-11 ENCOUNTER — Inpatient Hospital Stay: Payer: Medicare Other | Attending: Oncology

## 2021-01-11 ENCOUNTER — Inpatient Hospital Stay (HOSPITAL_BASED_OUTPATIENT_CLINIC_OR_DEPARTMENT_OTHER): Payer: Medicare Other | Admitting: Oncology

## 2021-01-11 VITALS — BP 133/68 | HR 72 | Temp 97.7°F | Resp 18 | Ht 69.0 in | Wt 204.3 lb

## 2021-01-11 DIAGNOSIS — C680 Malignant neoplasm of urethra: Secondary | ICD-10-CM

## 2021-01-11 DIAGNOSIS — C775 Secondary and unspecified malignant neoplasm of intrapelvic lymph nodes: Secondary | ICD-10-CM | POA: Insufficient documentation

## 2021-01-11 DIAGNOSIS — Z5112 Encounter for antineoplastic immunotherapy: Secondary | ICD-10-CM | POA: Diagnosis not present

## 2021-01-11 DIAGNOSIS — C609 Malignant neoplasm of penis, unspecified: Secondary | ICD-10-CM

## 2021-01-11 DIAGNOSIS — E032 Hypothyroidism due to medicaments and other exogenous substances: Secondary | ICD-10-CM

## 2021-01-11 DIAGNOSIS — Z79899 Other long term (current) drug therapy: Secondary | ICD-10-CM | POA: Insufficient documentation

## 2021-01-11 DIAGNOSIS — Z95828 Presence of other vascular implants and grafts: Secondary | ICD-10-CM

## 2021-01-11 DIAGNOSIS — C669 Malignant neoplasm of unspecified ureter: Secondary | ICD-10-CM | POA: Diagnosis not present

## 2021-01-11 LAB — CBC WITH DIFFERENTIAL (CANCER CENTER ONLY)
Abs Immature Granulocytes: 0.02 10*3/uL (ref 0.00–0.07)
Basophils Absolute: 0 10*3/uL (ref 0.0–0.1)
Basophils Relative: 0 %
Eosinophils Absolute: 0.4 10*3/uL (ref 0.0–0.5)
Eosinophils Relative: 7 %
HCT: 38.7 % — ABNORMAL LOW (ref 39.0–52.0)
Hemoglobin: 12.6 g/dL — ABNORMAL LOW (ref 13.0–17.0)
Immature Granulocytes: 0 %
Lymphocytes Relative: 33 %
Lymphs Abs: 2 10*3/uL (ref 0.7–4.0)
MCH: 28.2 pg (ref 26.0–34.0)
MCHC: 32.6 g/dL (ref 30.0–36.0)
MCV: 86.6 fL (ref 80.0–100.0)
Monocytes Absolute: 0.6 10*3/uL (ref 0.1–1.0)
Monocytes Relative: 9 %
Neutro Abs: 3 10*3/uL (ref 1.7–7.7)
Neutrophils Relative %: 51 %
Platelet Count: 186 10*3/uL (ref 150–400)
RBC: 4.47 MIL/uL (ref 4.22–5.81)
RDW: 14.1 % (ref 11.5–15.5)
WBC Count: 6.1 10*3/uL (ref 4.0–10.5)
nRBC: 0 % (ref 0.0–0.2)

## 2021-01-11 LAB — CMP (CANCER CENTER ONLY)
ALT: 14 U/L (ref 0–44)
AST: 19 U/L (ref 15–41)
Albumin: 3.7 g/dL (ref 3.5–5.0)
Alkaline Phosphatase: 76 U/L (ref 38–126)
Anion gap: 12 (ref 5–15)
BUN: 18 mg/dL (ref 8–23)
CO2: 27 mmol/L (ref 22–32)
Calcium: 9.6 mg/dL (ref 8.9–10.3)
Chloride: 104 mmol/L (ref 98–111)
Creatinine: 1.15 mg/dL (ref 0.61–1.24)
GFR, Estimated: >60 mL/min (ref >60)
Glucose, Bld: 122 mg/dL — ABNORMAL HIGH (ref 70–99)
Potassium: 3.2 mmol/L — ABNORMAL LOW (ref 3.5–5.1)
Sodium: 143 mmol/L (ref 135–145)
Total Bilirubin: 0.5 mg/dL (ref 0.3–1.2)
Total Protein: 7.1 g/dL (ref 6.5–8.1)

## 2021-01-11 LAB — TSH: TSH: 2.008 u[IU]/mL (ref 0.320–4.118)

## 2021-01-11 MED ORDER — SODIUM CHLORIDE 0.9% FLUSH
10.0000 mL | Freq: Once | INTRAVENOUS | Status: AC
  Administered 2021-01-11: 10 mL

## 2021-01-11 MED ORDER — HEPARIN SOD (PORK) LOCK FLUSH 100 UNIT/ML IV SOLN
500.0000 [IU] | Freq: Once | INTRAVENOUS | Status: AC | PRN
  Administered 2021-01-11: 500 [IU]

## 2021-01-11 MED ORDER — SODIUM CHLORIDE 0.9% FLUSH
10.0000 mL | INTRAVENOUS | Status: DC | PRN
  Administered 2021-01-11: 10 mL

## 2021-01-11 MED ORDER — SODIUM CHLORIDE 0.9 % IV SOLN
1200.0000 mg | Freq: Once | INTRAVENOUS | Status: AC
  Administered 2021-01-11: 1200 mg via INTRAVENOUS
  Filled 2021-01-11: qty 20

## 2021-01-11 MED ORDER — SODIUM CHLORIDE 0.9 % IV SOLN: Freq: Once | INTRAVENOUS | Status: AC

## 2021-01-11 NOTE — Patient Instructions (Signed)
[facility]  Discharge Instructions: °Thank you for choosing [facility] to provide your oncology and hematology care.  ° °If you have a lab appointment with the Cancer Center, please go directly to the Cancer Center and check in at the registration area. °  °Wear comfortable clothing and clothing appropriate for easy access to any Portacath or PICC line.  ° °We strive to give you quality time with your provider. You may need to reschedule your appointment if you arrive late (15 or more minutes).  Arriving late affects you and other patients whose appointments are after yours.  Also, if you miss three or more appointments without notifying the office, you may be dismissed from the clinic at the provider’s discretion.    °  °For prescription refill requests, have your pharmacy contact our office and allow 72 hours for refills to be completed.   ° °Today you received the following chemotherapy and/or immunotherapy agents: Tecentriq.     °  °To help prevent nausea and vomiting after your treatment, we encourage you to take your nausea medication as directed. ° °BELOW ARE SYMPTOMS THAT SHOULD BE REPORTED IMMEDIATELY: °*FEVER GREATER THAN 100.4 F (38 °C) OR HIGHER °*CHILLS OR SWEATING °*NAUSEA AND VOMITING THAT IS NOT CONTROLLED WITH YOUR NAUSEA MEDICATION °*UNUSUAL SHORTNESS OF BREATH °*UNUSUAL BRUISING OR BLEEDING °*URINARY PROBLEMS (pain or burning when urinating, or frequent urination) °*BOWEL PROBLEMS (unusual diarrhea, constipation, pain near the anus) °TENDERNESS IN MOUTH AND THROAT WITH OR WITHOUT PRESENCE OF ULCERS (sore throat, sores in mouth, or a toothache) °UNUSUAL RASH, SWELLING OR PAIN  °UNUSUAL VAGINAL DISCHARGE OR ITCHING  ° °Items with * indicate a potential emergency and should be followed up as soon as possible or go to the Emergency Department if any problems should occur. ° °Please show the CHEMOTHERAPY ALERT CARD or IMMUNOTHERAPY ALERT CARD at check-in  to the Emergency Department and triage nurse. ° °Should you have questions after your visit or need to cancel or reschedule your appointment, please contact [facility]  Dept: [phone]  and follow the prompts.  Office hours are 8:00 a.m. to 4:30 p.m. Monday - Friday. Please note that voicemails left after 4:00 p.m. may not be returned until the following business day.  We are closed weekends and major holidays. You have access to a nurse at all times for urgent questions. Please call the main number to the clinic Dept: [phone] and follow the prompts. ° ° °For any non-urgent questions, you may also contact your provider using MyChart. We now offer e-Visits for anyone 18 and older to request care online for non-urgent symptoms. For details visit [email or web address]. °  °Also download the MyChart app! Go to the app store, search "MyChart", open the app, select , and log in with your MyChart username and password. ° °Due to Covid, a mask is required upon entering the hospital/clinic. If you do not have a mask, one will be given to you upon arrival. For doctor visits, patients may have 1 support person aged 18 or older with them. For treatment visits, patients cannot have anyone with them due to current Covid guidelines and our immunocompromised population.  ° °

## 2021-01-11 NOTE — Progress Notes (Signed)
Hematology and Oncology Follow Up Visit  Mario Hubbard [id number] June 21, 1950 70 y.o. 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  He presented with localized disease in 2015.   Prior Therapy: He underwent surgical resection followed by radiation therapy in 2015.  He subsequently developed metastatic disease with Hubbard-sided pelvic lymph node and confirmed the presence of relapsed disease that is PET avid in 2017.   He was treated with carboplatin and Taxol initially with excellent response after 3 cycles. 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   He did show worsening disease in October 2018 and subsequently was started on Tecentriq. He relocated to [place] and received Tecentriq maintenance therapy with excellent response. CT scan in August 2021 continues to show no evidence of disease.  Current therapy: Tecentriq 1200 mg every 3 weeks started in 2018 while living in [place].  He is receiving it locally started on February 09, 2020.  He he presents for the subsequent cycle of treatment.  Interim History: [name] is here for a follow-up visit.  Since the last visit, he reports feeling well without any complaints.  He denies any nausea, vomiting or abdominal pain.  He denies any skin rashes or lesions.  He denies any excessive fatigue or tiredness.    Medications: Reviewed without changes. Current Outpatient Medications  Medication Sig Dispense Refill   amLODipine (NORVASC) 10 MG tablet Take 1 tablet (10 mg total) by mouth daily. 90 tablet 0   atorvastatin (LIPITOR) 10 MG tablet Take 1 tablet (10 mg total) by mouth daily. 90 tablet 1   indapamide (LOZOL) 2.5 MG tablet Take 1 tablet (2.5 mg total) by mouth daily. 90 tablet 1   losartan (COZAAR) 100 MG tablet Take  100 mg by mouth daily.     metoprolol tartrate (LOPRESSOR) 50 MG tablet Take 1 tablet (50 mg total) by mouth 2 (two) times daily. 180 tablet 0   potassium gluconate 595 (99 K) MG TABS tablet TAKE 1 TABLET (595 MG TOTAL) BY MOUTH DAILY. 110 tablet 1   sildenafil (VIAGRA) 50 MG tablet Take 50 mg by mouth daily as needed for erectile dysfunction.     No current facility-administered medications for this visit.     Allergies: No Known Allergies    Physical Exam:       Blood pressure 133/68, pulse 72, temperature 97.7 F (36.5 C), resp. rate 18, height 5\' 9"  (1.753 m), weight 204 lb 4.8 oz (92.7 kg), SpO2 100 %.      ECOG: 0   General appearance: Comfortable appearing without any discomfort Head: Normocephalic without any trauma Oropharynx: Mucous membranes are moist and pink without any thrush or ulcers. Eyes: Pupils are equal and round reactive to light. Lymph nodes: No cervical, supraclavicular, inguinal or axillary lymphadenopathy.   Heart:regular rate and rhythm.  S1 and S2 without leg edema. Lung: Clear without any rhonchi or wheezes.  No dullness to percussion. Abdomin: Soft, nontender, nondistended with good bowel sounds.  No hepatosplenomegaly. Musculoskeletal: No joint deformity or effusion.  Full range of motion noted. Neurological: No deficits noted on motor, sensory and deep tendon reflex exam. Skin: No petechial rash or dryness.  Appeared moist.                    Lab Results: Lab Results  Component  Value Date   WBC 6.1 01/11/2021   HGB 12.6 (L) 01/11/2021   HCT 38.7 (L) 01/11/2021   MCV 86.6 01/11/2021   PLT 186 01/11/2021     Chemistry      Component Value Date/Time   NA 138 12/28/2020 1327   K 3.3 (L) 12/28/2020 1327   CL 101 12/28/2020 1327   CO2 30 12/28/2020 1327   BUN 17 12/28/2020 1327   CREATININE 1.03 12/28/2020 1327   CREATININE 1.09 12/21/2020 0801      Component Value Date/Time   CALCIUM 9.7 12/28/2020 1327    ALKPHOS 74 12/21/2020 0801   AST 36 12/21/2020 0801   ALT 37 12/21/2020 0801   BILITOT 0.3 12/21/2020 0801        Impression and Plan:  70 year old with:   1.  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    He continues to tolerate immunotherapy with Tecentriq without any major complaints.  He had a complete response to therapy at this time without any residual complaints.  Risks and benefits of continuing this treatment were discussed.  Complications associated with this treatment including GI toxicity, dermatological toxicity as well as autoimmune concerns.  After discussion today he is agreeable to proceed and we will update his staging scans and December.  2. IV access: Port-A-Cath remains in place without any issues.   3. Antiemetics: No nausea or vomiting reported at this time.  Compazine is available to him.   4.  Immune mediated complications.  I continue to educate him about potential long-term complications including pneumonitis, colitis, hepatitis among others.  5.  Prognosis and goals of care: He had an excellent response to therapy and aggressive measures are warranted.  It is unclear whether his disease is cured by immunotherapy however.   6. Follow-up: In 3 weeks for the next cycle of therapy.   30  minutes were spent on this visit.  The time was dedicated to reviewing laboratory data, disease status update, treatment choices and outlining future plan of care.   Zola Button, MD 10/5/20228:02 AM

## 2021-02-01 ENCOUNTER — Inpatient Hospital Stay (HOSPITAL_BASED_OUTPATIENT_CLINIC_OR_DEPARTMENT_OTHER): Payer: Medicare Other | Admitting: Oncology

## 2021-02-01 ENCOUNTER — Inpatient Hospital Stay: Payer: Medicare Other

## 2021-02-01 ENCOUNTER — Other Ambulatory Visit: Payer: Self-pay

## 2021-02-01 VITALS — BP 118/65 | HR 52 | Temp 97.6°F | Resp 18 | Ht 69.0 in | Wt 203.8 lb

## 2021-02-01 DIAGNOSIS — Z5112 Encounter for antineoplastic immunotherapy: Secondary | ICD-10-CM | POA: Diagnosis not present

## 2021-02-01 DIAGNOSIS — C680 Malignant neoplasm of urethra: Secondary | ICD-10-CM

## 2021-02-01 DIAGNOSIS — Z95828 Presence of other vascular implants and grafts: Secondary | ICD-10-CM

## 2021-02-01 DIAGNOSIS — E032 Hypothyroidism due to medicaments and other exogenous substances: Secondary | ICD-10-CM

## 2021-02-01 DIAGNOSIS — C609 Malignant neoplasm of penis, unspecified: Secondary | ICD-10-CM

## 2021-02-01 LAB — CMP (CANCER CENTER ONLY)
ALT: 17 U/L (ref 0–44)
AST: 23 U/L (ref 15–41)
Albumin: 3.9 g/dL (ref 3.5–5.0)
Alkaline Phosphatase: 80 U/L (ref 38–126)
Anion gap: 11 (ref 5–15)
BUN: 20 mg/dL (ref 8–23)
CO2: 27 mmol/L (ref 22–32)
Calcium: 9.7 mg/dL (ref 8.9–10.3)
Chloride: 102 mmol/L (ref 98–111)
Creatinine: 1.16 mg/dL (ref 0.61–1.24)
GFR, Estimated: >60 mL/min (ref >60)
Glucose, Bld: 99 mg/dL (ref 70–99)
Potassium: 2.9 mmol/L — ABNORMAL LOW (ref 3.5–5.1)
Sodium: 140 mmol/L (ref 135–145)
Total Bilirubin: 0.6 mg/dL (ref 0.3–1.2)
Total Protein: 7.2 g/dL (ref 6.5–8.1)

## 2021-02-01 LAB — CBC WITH DIFFERENTIAL (CANCER CENTER ONLY)
Abs Immature Granulocytes: 0.01 10*3/uL (ref 0.00–0.07)
Basophils Absolute: 0 10*3/uL (ref 0.0–0.1)
Basophils Relative: 0 %
Eosinophils Absolute: 0.3 10*3/uL (ref 0.0–0.5)
Eosinophils Relative: 6 %
HCT: 39.4 % (ref 39.0–52.0)
Hemoglobin: 13.1 g/dL (ref 13.0–17.0)
Immature Granulocytes: 0 %
Lymphocytes Relative: 36 %
Lymphs Abs: 1.9 10*3/uL (ref 0.7–4.0)
MCH: 28.5 pg (ref 26.0–34.0)
MCHC: 33.2 g/dL (ref 30.0–36.0)
MCV: 85.7 fL (ref 80.0–100.0)
Monocytes Absolute: 0.5 10*3/uL (ref 0.1–1.0)
Monocytes Relative: 10 %
Neutro Abs: 2.4 10*3/uL (ref 1.7–7.7)
Neutrophils Relative %: 48 %
Platelet Count: 196 10*3/uL (ref 150–400)
RBC: 4.6 MIL/uL (ref 4.22–5.81)
RDW: 13.8 % (ref 11.5–15.5)
WBC Count: 5.1 10*3/uL (ref 4.0–10.5)
nRBC: 0 % (ref 0.0–0.2)

## 2021-02-01 LAB — TSH: TSH: 1.343 u[IU]/mL (ref 0.320–4.118)

## 2021-02-01 MED ORDER — SODIUM CHLORIDE 0.9% FLUSH
10.0000 mL | INTRAVENOUS | Status: DC | PRN
  Administered 2021-02-01: 10 mL

## 2021-02-01 MED ORDER — SODIUM CHLORIDE 0.9 % IV SOLN
1200.0000 mg | Freq: Once | INTRAVENOUS | Status: AC
  Administered 2021-02-01: 1200 mg via INTRAVENOUS
  Filled 2021-02-01: qty 20

## 2021-02-01 MED ORDER — SODIUM CHLORIDE 0.9% FLUSH
10.0000 mL | Freq: Once | INTRAVENOUS | Status: AC
  Administered 2021-02-01: 10 mL

## 2021-02-01 MED ORDER — SODIUM CHLORIDE 0.9 % IV SOLN: Freq: Once | INTRAVENOUS | Status: AC

## 2021-02-01 MED ORDER — HEPARIN SOD (PORK) LOCK FLUSH 100 UNIT/ML IV SOLN
500.0000 [IU] | Freq: Once | INTRAVENOUS | Status: AC | PRN
  Administered 2021-02-01: 500 [IU]

## 2021-02-01 NOTE — Progress Notes (Signed)
Hematology and Oncology Follow Up Visit  Mario Hubbard [id number] [birth year] 70 y.o. 02/01/2021 7:36 AM [name], MDJones, [name], MD   Principle Diagnosis: 70 year old man with ureter cancer diagnosed in 2015.  He developed stage IV high-grade urothelial carcinoma with pelvic adenopathy documented in 2017.    Prior Therapy: He underwent surgical resection followed by radiation therapy in 2015.  He subsequently developed metastatic disease with right-sided pelvic lymph node and confirmed the presence of relapsed disease that is PET avid in 2017.   He was treated with carboplatin and Taxol initially with excellent response after 3 cycles. 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   He did show worsening disease in October 2018 and subsequently was started on Tecentriq. He relocated to [place] and received Tecentriq maintenance therapy with excellent response. CT scan in August 2021 continues to show no evidence of disease.  Current therapy: Tecentriq 1200 mg every 3 weeks started in 2018 while living in [place].  He is receiving it locally started on February 09, 2020.  He returns for a follow-up treatment today.  Interim History: Mario Hubbard presents today for return evaluation.  Since the last visit, he reports no major changes in his health.  He denies any recent hospitalizations or illnesses.  He denies any nausea, vomiting or pruritus.  He denies any skin rashes or lesions.  He remains active and attends to activities of daily living.    Medications: Updated on review. Current Outpatient Medications  Medication Sig Dispense Refill   amLODipine (NORVASC) 10 MG tablet Take 1 tablet (10 mg total) by mouth daily. 90 tablet 0   atorvastatin (LIPITOR) 10 MG tablet Take 1 tablet (10 mg total) by mouth daily. 90 tablet 1   indapamide (LOZOL) 2.5 MG tablet Take 1 tablet (2.5 mg total) by mouth daily. 90 tablet 1   losartan  (COZAAR) 100 MG tablet Take 100 mg by mouth daily.     metoprolol tartrate (LOPRESSOR) 50 MG tablet Take 1 tablet (50 mg total) by mouth 2 (two) times daily. 180 tablet 0   potassium gluconate 595 (99 K) MG TABS tablet TAKE 1 TABLET (595 MG TOTAL) BY MOUTH DAILY. 110 tablet 1   sildenafil (VIAGRA) 50 MG tablet Take 50 mg by mouth daily as needed for erectile dysfunction.     No current facility-administered medications for this visit.     Allergies: No Known Allergies    Physical Exam:     Blood pressure 118/65, pulse (!) 52, temperature 97.6 F (36.4 C), temperature source Tympanic, resp. rate 18, height 5\' 9"  (1.753 m), weight 203 lb 12.8 oz (92.4 kg), SpO2 100 %.         ECOG: 0    General appearance: Alert, awake without any distress. Head: Atraumatic without abnormalities Oropharynx: Without any thrush or ulcers. Eyes: No scleral icterus. Lymph nodes: No lymphadenopathy noted in the cervical, supraclavicular, or axillary nodes Heart:regular rate and rhythm, without any murmurs or gallops.   Lung: Clear to auscultation without any rhonchi, wheezes or dullness to percussion. Abdomin: Soft, nontender without any shifting dullness or ascites. Musculoskeletal: No clubbing or cyanosis. Neurological: No motor or sensory deficits. Skin: No rashes or lesions.                    Lab Results: Lab Results  Component Value Date   WBC 6.1 01/11/2021   HGB 12.6 (L) 01/11/2021   HCT 38.7 (L) 01/11/2021  MCV 86.6 01/11/2021   PLT 186 01/11/2021     Chemistry      Component Value Date/Time   NA 143 01/11/2021 0743   K 3.2 (L) 01/11/2021 0743   CL 104 01/11/2021 0743   CO2 27 01/11/2021 0743   BUN 18 01/11/2021 0743   CREATININE 1.15 01/11/2021 0743      Component Value Date/Time   CALCIUM 9.6 01/11/2021 0743   ALKPHOS 76 01/11/2021 0743   AST 19 01/11/2021 0743   ALT 14 01/11/2021 0743   BILITOT 0.5 01/11/2021 0743        Impression and  Plan:  70 year old with:   1.  Ureteral cancer diagnosed in 2017.  He developed stage IV urothelial carcinoma with pelvic adenopathy currently experiencing a complete response.   He is currently on Tecentriq which she has tolerated very well with excellent response and tolerance.  Risks and benefits of continuing this treatment long-term were discussed.  These complications include nausea, fatigue and immune mediated complications.  Plan is to update his staging scans in December 2022.  He is agreeable with this plan.  2. IV access: Port-A-Cath currently in use without any issues.   3. Antiemetics: Compazine is available to him without any nausea or vomiting.   4.  Immune mediated complications.  He has not experienced any complications including pneumonitis, colitis and thyroid disease.  5.  Prognosis and goals of care: Therapy remains palliative although aggressive measures are warranted given his complete response to therapy.   6. Follow-up: He will return in 3 weeks for follow-up visit.   30  minutes were dedicated to this encounter.  Time was spent on reviewing laboratory data, disease status update and outlining future plan of care.   Zola Button, MD 10/26/20227:36 AM

## 2021-02-01 NOTE — Patient Instructions (Signed)
[facility]  Discharge Instructions: Thank you for choosing [facility] to provide your oncology and hematology care.   If you have a lab appointment with the [facility], please go directly to the [facility] and check in at the registration area.   Wear comfortable clothing and clothing appropriate for easy access to any Portacath or PICC line.   We strive to give you quality time with your provider. You may need to reschedule your appointment if you arrive late (15 or more minutes).  Arriving late affects you and other patients whose appointments are after yours.  Also, if you miss three or more appointments without notifying the office, you may be dismissed from the clinic at the provider's discretion.      For prescription refill requests, have your pharmacy contact our office and allow 72 hours for refills to be completed.    Today you received the following chemotherapy and/or immunotherapy agents Tecentriq      To help prevent nausea and vomiting after your treatment, we encourage you to take your nausea medication as directed.  BELOW ARE SYMPTOMS THAT SHOULD BE REPORTED IMMEDIATELY: *FEVER GREATER THAN 100.4 F (38 C) OR HIGHER *CHILLS OR SWEATING *NAUSEA AND VOMITING THAT IS NOT CONTROLLED WITH YOUR NAUSEA MEDICATION *UNUSUAL SHORTNESS OF BREATH *UNUSUAL BRUISING OR BLEEDING *URINARY PROBLEMS (pain or burning when urinating, or frequent urination) *BOWEL PROBLEMS (unusual diarrhea, constipation, pain near the anus) TENDERNESS IN MOUTH AND THROAT WITH OR WITHOUT PRESENCE OF ULCERS (sore throat, sores in mouth, or a toothache) UNUSUAL RASH, SWELLING OR PAIN  UNUSUAL VAGINAL DISCHARGE OR ITCHING   Items with * indicate a potential emergency and should be followed up as soon as possible or go to the Emergency Department if any problems should occur.  Please show the CHEMOTHERAPY ALERT CARD or IMMUNOTHERAPY ALERT CARD at check-in  to the Emergency Department and triage nurse.  Should you have questions after your visit or need to cancel or reschedule your appointment, please contact [facility]  Dept: [phone]  and follow the prompts.  Office hours are 8:00 a.m. to 4:30 p.m. Monday - Friday. Please note that voicemails left after 4:00 p.m. may not be returned until the following business day.  We are closed weekends and major holidays. You have access to a nurse at all times for urgent questions. Please call the main number to the clinic Dept: [phone] and follow the prompts.   For any non-urgent questions, you may also contact your provider using MyChart. We now offer e-Visits for anyone 71 and older to request care online for non-urgent symptoms. For details visit [email or web address].   Also download the MyChart app! Go to the app store, search "MyChart", open the app, select [facility], and log in with your MyChart username and password.  Due to Covid, a mask is required upon entering the hospital/clinic. If you do not have a mask, one will be given to you upon arrival. For doctor visits, patients may have 1 support person aged 71 or older with them. For treatment visits, patients cannot have anyone with them due to current Covid guidelines and our immunocompromised population.

## 2021-02-23 ENCOUNTER — Inpatient Hospital Stay (HOSPITAL_BASED_OUTPATIENT_CLINIC_OR_DEPARTMENT_OTHER): Payer: Medicare Other | Admitting: Oncology

## 2021-02-23 ENCOUNTER — Inpatient Hospital Stay: Payer: Medicare Other | Attending: Oncology

## 2021-02-23 ENCOUNTER — Inpatient Hospital Stay: Payer: Medicare Other

## 2021-02-23 ENCOUNTER — Other Ambulatory Visit: Payer: Self-pay

## 2021-02-23 VITALS — HR 57 | Temp 97.8°F | Resp 18 | Ht 69.0 in | Wt 206.2 lb

## 2021-02-23 VITALS — BP 136/71

## 2021-02-23 DIAGNOSIS — E876 Hypokalemia: Secondary | ICD-10-CM | POA: Insufficient documentation

## 2021-02-23 DIAGNOSIS — Z5112 Encounter for antineoplastic immunotherapy: Secondary | ICD-10-CM | POA: Insufficient documentation

## 2021-02-23 DIAGNOSIS — R590 Localized enlarged lymph nodes: Secondary | ICD-10-CM | POA: Diagnosis not present

## 2021-02-23 DIAGNOSIS — C680 Malignant neoplasm of urethra: Secondary | ICD-10-CM

## 2021-02-23 DIAGNOSIS — C669 Malignant neoplasm of unspecified ureter: Secondary | ICD-10-CM | POA: Diagnosis not present

## 2021-02-23 DIAGNOSIS — E032 Hypothyroidism due to medicaments and other exogenous substances: Secondary | ICD-10-CM

## 2021-02-23 DIAGNOSIS — C609 Malignant neoplasm of penis, unspecified: Secondary | ICD-10-CM

## 2021-02-23 DIAGNOSIS — Z95828 Presence of other vascular implants and grafts: Secondary | ICD-10-CM

## 2021-02-23 DIAGNOSIS — Z79899 Other long term (current) drug therapy: Secondary | ICD-10-CM | POA: Diagnosis not present

## 2021-02-23 LAB — CBC WITH DIFFERENTIAL (CANCER CENTER ONLY)
Abs Immature Granulocytes: 0.02 10*3/uL (ref 0.00–0.07)
Basophils Absolute: 0 10*3/uL (ref 0.0–0.1)
Basophils Relative: 0 %
Eosinophils Absolute: 0.4 10*3/uL (ref 0.0–0.5)
Eosinophils Relative: 6 %
HCT: 39.4 % (ref 39.0–52.0)
Hemoglobin: 13.2 g/dL (ref 13.0–17.0)
Immature Granulocytes: 0 %
Lymphocytes Relative: 28 %
Lymphs Abs: 1.6 10*3/uL (ref 0.7–4.0)
MCH: 28.3 pg (ref 26.0–34.0)
MCHC: 33.5 g/dL (ref 30.0–36.0)
MCV: 84.5 fL (ref 80.0–100.0)
Monocytes Absolute: 0.5 10*3/uL (ref 0.1–1.0)
Monocytes Relative: 10 %
Neutro Abs: 3.1 10*3/uL (ref 1.7–7.7)
Neutrophils Relative %: 56 %
Platelet Count: 178 10*3/uL (ref 150–400)
RBC: 4.66 MIL/uL (ref 4.22–5.81)
RDW: 13.8 % (ref 11.5–15.5)
WBC Count: 5.5 10*3/uL (ref 4.0–10.5)
nRBC: 0 % (ref 0.0–0.2)

## 2021-02-23 LAB — CMP (CANCER CENTER ONLY)
ALT: 16 U/L (ref 0–44)
AST: 20 U/L (ref 15–41)
Albumin: 3.9 g/dL (ref 3.5–5.0)
Alkaline Phosphatase: 90 U/L (ref 38–126)
Anion gap: 11 (ref 5–15)
BUN: 23 mg/dL (ref 8–23)
CO2: 28 mmol/L (ref 22–32)
Calcium: 9.5 mg/dL (ref 8.9–10.3)
Chloride: 100 mmol/L (ref 98–111)
Creatinine: 1.15 mg/dL (ref 0.61–1.24)
GFR, Estimated: >60 mL/min (ref >60)
Glucose, Bld: 127 mg/dL — ABNORMAL HIGH (ref 70–99)
Potassium: 2.9 mmol/L — ABNORMAL LOW (ref 3.5–5.1)
Sodium: 139 mmol/L (ref 135–145)
Total Bilirubin: 0.7 mg/dL (ref 0.3–1.2)
Total Protein: 7.2 g/dL (ref 6.5–8.1)

## 2021-02-23 LAB — TSH: TSH: 1.08 u[IU]/mL (ref 0.320–4.118)

## 2021-02-23 MED ORDER — SODIUM CHLORIDE 0.9% FLUSH
10.0000 mL | Freq: Once | INTRAVENOUS | Status: AC
  Administered 2021-02-23: 08:00:00 10 mL

## 2021-02-23 MED ORDER — SODIUM CHLORIDE 0.9 % IV SOLN: Freq: Once | INTRAVENOUS | Status: AC

## 2021-02-23 MED ORDER — SODIUM CHLORIDE 0.9 % IV SOLN
1200.0000 mg | Freq: Once | INTRAVENOUS | Status: AC
  Administered 2021-02-23: 10:00:00 1200 mg via INTRAVENOUS
  Filled 2021-02-23: qty 20

## 2021-02-23 MED ORDER — SODIUM CHLORIDE 0.9% FLUSH
10.0000 mL | INTRAVENOUS | Status: DC | PRN
  Administered 2021-02-23: 11:00:00 10 mL

## 2021-02-23 MED ORDER — POTASSIUM CHLORIDE CRYS ER 20 MEQ PO TBCR: 20.0000 meq | EXTENDED_RELEASE_TABLET | Freq: Every day | ORAL | 1 refills | Status: DC

## 2021-02-23 MED ORDER — HEPARIN SOD (PORK) LOCK FLUSH 100 UNIT/ML IV SOLN
500.0000 [IU] | Freq: Once | INTRAVENOUS | Status: AC | PRN
  Administered 2021-02-23: 11:00:00 500 [IU]

## 2021-02-23 NOTE — Patient Instructions (Signed)
[facility]  Discharge Instructions: °Thank you for choosing [facility] to provide your oncology and hematology care.  ° °If you have a lab appointment with the Cancer Center, please go directly to the Cancer Center and check in at the registration area. °  °Wear comfortable clothing and clothing appropriate for easy access to any Portacath or PICC line.  ° °We strive to give you quality time with your provider. You may need to reschedule your appointment if you arrive late (15 or more minutes).  Arriving late affects you and other patients whose appointments are after yours.  Also, if you miss three or more appointments without notifying the office, you may be dismissed from the clinic at the provider’s discretion.    °  °For prescription refill requests, have your pharmacy contact our office and allow 72 hours for refills to be completed.   ° °Today you received the following chemotherapy and/or immunotherapy agents: Tecentriq.     °  °To help prevent nausea and vomiting after your treatment, we encourage you to take your nausea medication as directed. ° °BELOW ARE SYMPTOMS THAT SHOULD BE REPORTED IMMEDIATELY: °*FEVER GREATER THAN 100.4 F (38 °C) OR HIGHER °*CHILLS OR SWEATING °*NAUSEA AND VOMITING THAT IS NOT CONTROLLED WITH YOUR NAUSEA MEDICATION °*UNUSUAL SHORTNESS OF BREATH °*UNUSUAL BRUISING OR BLEEDING °*URINARY PROBLEMS (pain or burning when urinating, or frequent urination) °*BOWEL PROBLEMS (unusual diarrhea, constipation, pain near the anus) °TENDERNESS IN MOUTH AND THROAT WITH OR WITHOUT PRESENCE OF ULCERS (sore throat, sores in mouth, or a toothache) °UNUSUAL RASH, SWELLING OR PAIN  °UNUSUAL VAGINAL DISCHARGE OR ITCHING  ° °Items with * indicate a potential emergency and should be followed up as soon as possible or go to the Emergency Department if any problems should occur. ° °Please show the CHEMOTHERAPY ALERT CARD or IMMUNOTHERAPY ALERT CARD at check-in  to the Emergency Department and triage nurse. ° °Should you have questions after your visit or need to cancel or reschedule your appointment, please contact [facility]  Dept: [phone]  and follow the prompts.  Office hours are 8:00 a.m. to 4:30 p.m. Monday - Friday. Please note that voicemails left after 4:00 p.m. may not be returned until the following business day.  We are closed weekends and major holidays. You have access to a nurse at all times for urgent questions. Please call the main number to the clinic Dept: [phone] and follow the prompts. ° ° °For any non-urgent questions, you may also contact your provider using MyChart. We now offer e-Visits for anyone 18 and older to request care online for non-urgent symptoms. For details visit [email or web address]. °  °Also download the MyChart app! Go to the app store, search "MyChart", open the app, select , and log in with your MyChart username and password. ° °Due to Covid, a mask is required upon entering the hospital/clinic. If you do not have a mask, one will be given to you upon arrival. For doctor visits, patients may have 1 support person aged 18 or older with them. For treatment visits, patients cannot have anyone with them due to current Covid guidelines and our immunocompromised population.  ° °

## 2021-02-23 NOTE — Addendum Note (Signed)
Addended by: Wyatt Portela on: 02/23/2021 09:02 AM   Modules accepted: Orders

## 2021-02-23 NOTE — Progress Notes (Addendum)
Hematology and Oncology Follow Up Visit  Mario Hubbard [id number] 09-12-1950 70 y.o. 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    Prior Therapy: He underwent surgical resection followed by radiation therapy in 2015.  He subsequently developed metastatic disease with right-sided pelvic lymph node and confirmed the presence of relapsed disease that is PET avid in 2017.   He was treated with carboplatin and Taxol initially with excellent response after 3 cycles. 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   He did show worsening disease in October 2018 and subsequently was started on Tecentriq. He relocated to [place] and received Tecentriq maintenance therapy with excellent response. CT scan in August 2021 continues to show no evidence of disease.  Current therapy: Tecentriq 1200 mg every 3 weeks started in 2018 while living in [place].  He is receiving it locally started on February 09, 2020.  He is here for the next cycle of therapy.  Interim History: Mario Hubbard returns today for a follow-up visit.  Since the last visit, he reports no major changes in his health.  He denies any recent hospitalizations or illnesses.  He continues to tolerate Tecentriq without any complaints.  He denies any nausea, vomiting or abdominal pain.  He denies any shortness of breath or difficulty breathing.  His performance status quality of life remains excellent.    Medications: Reviewed without changes. Current Outpatient Medications  Medication Sig Dispense Refill   amLODipine (NORVASC) 10 MG tablet Take 1 tablet (10 mg total) by mouth daily. 90 tablet 0   atorvastatin (LIPITOR) 10 MG tablet Take 1 tablet (10 mg total) by mouth daily. 90 tablet 1   indapamide (LOZOL) 2.5 MG tablet Take 1 tablet (2.5  mg total) by mouth daily. 90 tablet 1   losartan (COZAAR) 100 MG tablet Take 100 mg by mouth daily.     metoprolol tartrate (LOPRESSOR) 50 MG tablet Take 1 tablet (50 mg total) by mouth 2 (two) times daily. 180 tablet 0   potassium gluconate 595 (99 K) MG TABS tablet TAKE 1 TABLET (595 MG TOTAL) BY MOUTH DAILY. 110 tablet 1   sildenafil (VIAGRA) 50 MG tablet Take 50 mg by mouth daily as needed for erectile dysfunction.     No current facility-administered medications for this visit.   Facility-Administered Medications Ordered in Other Visits  Medication Dose Route Frequency Provider Last Rate Last Admin   sodium chloride flush (NS) 0.9 % injection 10 mL  10 mL Intracatheter Once Wyatt Portela, MD         Allergies: No Known Allergies    Physical Exam:         Pulse (!) 57, temperature 97.8 F (36.6 C), temperature source Oral, resp. rate 18, height 5\' 9"  (1.753 m), weight 206 lb 3.2 oz (93.5 kg), SpO2 100 %.      ECOG: 0   General appearance: Comfortable appearing without any discomfort Head: Normocephalic without any trauma Oropharynx: Mucous membranes are moist and pink without any thrush or ulcers. Eyes: Pupils are equal and round reactive to light. Lymph nodes: No cervical, supraclavicular, inguinal or axillary lymphadenopathy.   Heart:regular rate and rhythm.  S1 and S2 without leg edema. Lung: Clear without any rhonchi or wheezes.  No dullness to percussion. Abdomin: Soft, nontender, nondistended with good bowel sounds.  No hepatosplenomegaly.  Musculoskeletal: No joint deformity or effusion.  Full range of motion noted. Neurological: No deficits noted on motor, sensory and deep tendon reflex exam. Skin: No petechial rash or dryness.  Appeared moist.                      Lab Results: Lab Results  Component Value Date   WBC 5.1 02/01/2021   HGB 13.1 02/01/2021   HCT 39.4 02/01/2021   MCV 85.7 02/01/2021   PLT 196 02/01/2021      Chemistry      Component Value Date/Time   NA 140 02/01/2021 0804   K 2.9 (L) 02/01/2021 0804   CL 102 02/01/2021 0804   CO2 27 02/01/2021 0804   BUN 20 02/01/2021 0804   CREATININE 1.16 02/01/2021 0804      Component Value Date/Time   CALCIUM 9.7 02/01/2021 0804   ALKPHOS 80 02/01/2021 0804   AST 23 02/01/2021 0804   ALT 17 02/01/2021 0804   BILITOT 0.6 02/01/2021 0804        Impression and Plan:  70 year old with:   1.  Stage IV high-grade urothelial carcinoma with pelvic adenopathy diagnosed in 2017.  He has a complete response to therapy.  Risks and benefits of continuing Tecentriq at this time were discussed.  Complications that include nausea, fatigue and immune mediated issues were reiterated.  He continues to experience excellent clinical response.  The plan is to proceed with treatment and update his staging scan in December 2022.  Alternative treatment options including systemic chemotherapy will be used upon relapse.   2. IV access: Port-A-Cath continues to be in use without any issues.   3. Antiemetics: No nausea or vomiting reported at this time.  Compazine is available to him.   4.  Immune mediated complications.  I continue to educate him about potential complication including pneumonitis, colitis and thyroid disease.  5.  Prognosis and goals of care: His disease is incurable although aggressive measures are warranted given his excellent response and performance status.  6.  Hypokalemia: We will continue to monitor and will replace regularly.  I also encouraged him to eat high potassium diet.  7. Follow-up: In 3 weeks for repeat evaluation.   30  minutes were spent on this visit.  The time was dedicated to reviewing laboratory data, disease status update and outlining future plan of care discussion.   Zola Button, MD 11/17/20227:58 AM

## 2021-03-09 ENCOUNTER — Ambulatory Visit (HOSPITAL_COMMUNITY)
Admission: RE | Admit: 2021-03-09 | Discharge: 2021-03-09 | Disposition: A | Discharge status: Home / Self Care | Payer: Medicare Other | Source: Ambulatory Visit | Attending: Oncology | Admitting: Oncology

## 2021-03-09 ENCOUNTER — Other Ambulatory Visit: Payer: Self-pay

## 2021-03-09 DIAGNOSIS — C680 Malignant neoplasm of urethra: Secondary | ICD-10-CM | POA: Diagnosis present

## 2021-03-15 ENCOUNTER — Inpatient Hospital Stay: Payer: Medicare Other | Attending: Oncology

## 2021-03-15 ENCOUNTER — Other Ambulatory Visit: Payer: Self-pay

## 2021-03-15 ENCOUNTER — Inpatient Hospital Stay: Payer: Medicare Other

## 2021-03-15 ENCOUNTER — Inpatient Hospital Stay (HOSPITAL_BASED_OUTPATIENT_CLINIC_OR_DEPARTMENT_OTHER): Payer: Medicare Other | Admitting: Oncology

## 2021-03-15 VITALS — BP 136/62 | HR 60 | Temp 98.1°F | Resp 17 | Wt 205.0 lb

## 2021-03-15 DIAGNOSIS — Z95828 Presence of other vascular implants and grafts: Secondary | ICD-10-CM

## 2021-03-15 DIAGNOSIS — C680 Malignant neoplasm of urethra: Secondary | ICD-10-CM

## 2021-03-15 DIAGNOSIS — C679 Malignant neoplasm of bladder, unspecified: Secondary | ICD-10-CM | POA: Diagnosis not present

## 2021-03-15 DIAGNOSIS — Z79899 Other long term (current) drug therapy: Secondary | ICD-10-CM | POA: Insufficient documentation

## 2021-03-15 DIAGNOSIS — Z5112 Encounter for antineoplastic immunotherapy: Secondary | ICD-10-CM | POA: Diagnosis present

## 2021-03-15 DIAGNOSIS — C609 Malignant neoplasm of penis, unspecified: Secondary | ICD-10-CM

## 2021-03-15 DIAGNOSIS — E032 Hypothyroidism due to medicaments and other exogenous substances: Secondary | ICD-10-CM

## 2021-03-15 LAB — CMP (CANCER CENTER ONLY)
ALT: 15 U/L (ref 0–44)
AST: 17 U/L (ref 15–41)
Albumin: 3.8 g/dL (ref 3.5–5.0)
Alkaline Phosphatase: 82 U/L (ref 38–126)
Anion gap: 11 (ref 5–15)
BUN: 18 mg/dL (ref 8–23)
CO2: 27 mmol/L (ref 22–32)
Calcium: 9 mg/dL (ref 8.9–10.3)
Chloride: 103 mmol/L (ref 98–111)
Creatinine: 1.12 mg/dL (ref 0.61–1.24)
GFR, Estimated: >60 mL/min (ref >60)
Glucose, Bld: 107 mg/dL — ABNORMAL HIGH (ref 70–99)
Potassium: 3.2 mmol/L — ABNORMAL LOW (ref 3.5–5.1)
Sodium: 141 mmol/L (ref 135–145)
Total Bilirubin: 0.4 mg/dL (ref 0.3–1.2)
Total Protein: 7.1 g/dL (ref 6.5–8.1)

## 2021-03-15 LAB — CBC WITH DIFFERENTIAL (CANCER CENTER ONLY)
Abs Immature Granulocytes: 0.02 10*3/uL (ref 0.00–0.07)
Basophils Absolute: 0 10*3/uL (ref 0.0–0.1)
Basophils Relative: 0 %
Eosinophils Absolute: 0.3 10*3/uL (ref 0.0–0.5)
Eosinophils Relative: 6 %
HCT: 39.7 % (ref 39.0–52.0)
Hemoglobin: 13.3 g/dL (ref 13.0–17.0)
Immature Granulocytes: 0 %
Lymphocytes Relative: 35 %
Lymphs Abs: 2 10*3/uL (ref 0.7–4.0)
MCH: 28.8 pg (ref 26.0–34.0)
MCHC: 33.5 g/dL (ref 30.0–36.0)
MCV: 85.9 fL (ref 80.0–100.0)
Monocytes Absolute: 0.5 10*3/uL (ref 0.1–1.0)
Monocytes Relative: 10 %
Neutro Abs: 2.8 10*3/uL (ref 1.7–7.7)
Neutrophils Relative %: 49 %
Platelet Count: 208 10*3/uL (ref 150–400)
RBC: 4.62 MIL/uL (ref 4.22–5.81)
RDW: 13.5 % (ref 11.5–15.5)
WBC Count: 5.7 10*3/uL (ref 4.0–10.5)
nRBC: 0 % (ref 0.0–0.2)

## 2021-03-15 LAB — TSH: TSH: 1.821 u[IU]/mL (ref 0.320–4.118)

## 2021-03-15 MED ORDER — HEPARIN SOD (PORK) LOCK FLUSH 100 UNIT/ML IV SOLN
500.0000 [IU] | Freq: Once | INTRAVENOUS | Status: AC | PRN
  Administered 2021-03-15: 500 [IU]

## 2021-03-15 MED ORDER — SODIUM CHLORIDE 0.9 % IV SOLN: Freq: Once | INTRAVENOUS | Status: AC

## 2021-03-15 MED ORDER — SODIUM CHLORIDE 0.9 % IV SOLN
1200.0000 mg | Freq: Once | INTRAVENOUS | Status: AC
  Administered 2021-03-15: 1200 mg via INTRAVENOUS
  Filled 2021-03-15: qty 20

## 2021-03-15 MED ORDER — SODIUM CHLORIDE 0.9% FLUSH
10.0000 mL | Freq: Once | INTRAVENOUS | Status: AC
  Administered 2021-03-15: 10 mL

## 2021-03-15 MED ORDER — SODIUM CHLORIDE 0.9% FLUSH
10.0000 mL | INTRAVENOUS | Status: DC | PRN
  Administered 2021-03-15: 10 mL

## 2021-03-15 NOTE — Progress Notes (Signed)
Hematology and Oncology Follow Up Visit  Murtaza Shell [id number] 1950-08-19 70 y.o. 03/15/2021 7:52 AM [name], MDJones, [name], MD   Principle Diagnosis: 70 year old man with ureteral cancer diagnosed in 2015.  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    Prior Therapy: He underwent surgical resection followed by radiation therapy in 2015.  He subsequently developed metastatic disease with right-sided pelvic lymph node and confirmed the presence of relapsed disease that is PET avid in 2017.   He was treated with carboplatin and Taxol initially with excellent response after 3 cycles. 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   He did show worsening disease in October 2018 and subsequently was started on Tecentriq. He relocated to [place] and received Tecentriq maintenance therapy with excellent response. CT scan in August 2021 continues to show no evidence of disease.  Current therapy: Tecentriq 1200 mg every 3 weeks started in 2018 while living in [place].  He is receiving it locally started on February 09, 2020.  Return for the next cycle of therapy.   Interim History: Mr. Granja presents today for a follow-up visit.  Since the last visit, he reports feeling well without any major complaints.  He denies any complications related Tecentriq.  He denies any nausea, vomiting or diarrhea.  He denies any shortness of breath or difficulty breathing.  His performance status quality of life remained excellent.  He continues to exercise occasionally and fish regularly.   Medications: Updated on review. Current Outpatient Medications  Medication Sig Dispense Refill   amLODipine (NORVASC) 10 MG tablet Take 1 tablet (10 mg total) by mouth daily. 90 tablet 0   atorvastatin (LIPITOR) 10 MG tablet Take 1 tablet (10 mg total) by mouth daily. 90 tablet 1   indapamide (LOZOL) 2.5 MG tablet Take 1  tablet (2.5 mg total) by mouth daily. 90 tablet 1   losartan (COZAAR) 100 MG tablet Take 100 mg by mouth daily.     metoprolol tartrate (LOPRESSOR) 50 MG tablet Take 1 tablet (50 mg total) by mouth 2 (two) times daily. 180 tablet 0   potassium chloride SA (KLOR-CON) 20 MEQ tablet Take 1 tablet (20 mEq total) by mouth daily. 30 tablet 1   potassium gluconate 595 (99 K) MG TABS tablet TAKE 1 TABLET (595 MG TOTAL) BY MOUTH DAILY. 110 tablet 1   sildenafil (VIAGRA) 50 MG tablet Take 50 mg by mouth daily as needed for erectile dysfunction.     No current facility-administered medications for this visit.     Allergies: No Known Allergies    Physical Exam:      Blood pressure 136/62, pulse 60, temperature 98.1 F (36.7 C), temperature source Temporal, resp. rate 17, weight 205 lb (93 kg), SpO2 100 %.          ECOG: 0     General appearance: Alert, awake without any distress. Head: Atraumatic without abnormalities Oropharynx: Without any thrush or ulcers. Eyes: No scleral icterus. Lymph nodes: No lymphadenopathy noted in the cervical, supraclavicular, or axillary nodes Heart:regular rate and rhythm, without any murmurs or gallops.   Lung: Clear to auscultation without any rhonchi, wheezes or dullness to percussion. Abdomin: Soft, nontender without any shifting dullness or ascites. Musculoskeletal: No clubbing or cyanosis. Neurological: No motor or sensory deficits. Skin: No rashes or lesions.  Lab Results: Lab Results  Component Value Date   WBC 5.5 02/23/2021   HGB 13.2 02/23/2021   HCT 39.4 02/23/2021   MCV 84.5 02/23/2021   PLT 178 02/23/2021     Chemistry      Component Value Date/Time   NA 139 02/23/2021 0806   K 2.9 (L) 02/23/2021 0806   CL 100 02/23/2021 0806   CO2 28 02/23/2021 0806   BUN 23 02/23/2021 0806   CREATININE 1.15 02/23/2021 0806      Component Value Date/Time   CALCIUM 9.5 02/23/2021 0806    ALKPHOS 90 02/23/2021 0806   AST 20 02/23/2021 0806   ALT 16 02/23/2021 0806   BILITOT 0.7 02/23/2021 0806      IMPRESSION: 1. Stable examination without evidence of local recurrence or metastatic disease. 2. 2.6 cm right thyroid nodule is grossly unchanged from previous studies. If not previously performed, Recommend thyroid US.(Ref: J Am Coll Radiol. 2015 Feb;12(2): 143-50). 3. 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 4. Coronary, Aortic Atherosclerosis (ICD10-I70.0) and Emphysema (ICD10-J43.9).  Impression and Plan:  70 year old with:   1.  Ureteral cancer diagnosed in 2015.  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  His disease status was updated at this time and treatment choices were reviewed.  CT scan obtained on March 09, 2021 showed complete response to therapy without any issues or evidence of relapse.  Risks and benefits of continuing immunotherapy were reviewed at this time.  Potential complications including immune mediated issues among others were reviewed.  Alternative options including active surveillance at this time were reiterated.  After discussion today, we opted to continue with active treatment given his excellent tolerance.   2. IV access: No complications related to Port-A-Cath at this time noted.   3. Antiemetics: Compazine is available to him.  No nausea or vomiting.   4.  Immune mediated complications.  No issues reported at this time.  I continue to educate him about potential complications such as pneumonitis, colitis and thyroid disease.  5.  Prognosis and goals of care: Therapy remains palliative although aggressive measures are warranted given his reasonable performance status.  6.  Hypokalemia: We will continue to monitor and replace at this time.  7. Follow-up: In 3 weeks for the next cycle of therapy.   30  minutes were dedicated to this  encounter.  The time was spent on reviewing laboratory data, disease status update, reviewing imaging studies and future plan of care discussion.   Zola Button, MD 12/7/20227:52 AM

## 2021-03-15 NOTE — Patient Instructions (Signed)
[facility]  Discharge Instructions: ?Thank you for choosing [facility] to provide your oncology and hematology care.  ? ?If you have a lab appointment with the Cancer Center, please go directly to the Cancer Center and check in at the registration area. ?  ?Wear comfortable clothing and clothing appropriate for easy access to any Portacath or PICC line.  ? ?We strive to give you quality time with your provider. You may need to reschedule your appointment if you arrive late (15 or more minutes).  Arriving late affects you and other patients whose appointments are after yours.  Also, if you miss three or more appointments without notifying the office, you may be dismissed from the clinic at the provider?s discretion.    ?  ?For prescription refill requests, have your pharmacy contact our office and allow 72 hours for refills to be completed.   ? ?Today you received the following chemotherapy and/or immunotherapy agents: atezolizumab    ?  ?To help prevent nausea and vomiting after your treatment, we encourage you to take your nausea medication as directed. ? ?BELOW ARE SYMPTOMS THAT SHOULD BE REPORTED IMMEDIATELY: ?*FEVER GREATER THAN 100.4 F (38 ?C) OR HIGHER ?*CHILLS OR SWEATING ?*NAUSEA AND VOMITING THAT IS NOT CONTROLLED WITH YOUR NAUSEA MEDICATION ?*UNUSUAL SHORTNESS OF BREATH ?*UNUSUAL BRUISING OR BLEEDING ?*URINARY PROBLEMS (pain or burning when urinating, or frequent urination) ?*BOWEL PROBLEMS (unusual diarrhea, constipation, pain near the anus) ?TENDERNESS IN MOUTH AND THROAT WITH OR WITHOUT PRESENCE OF ULCERS (sore throat, sores in mouth, or a toothache) ?UNUSUAL RASH, SWELLING OR PAIN  ?UNUSUAL VAGINAL DISCHARGE OR ITCHING  ? ?Items with * indicate a potential emergency and should be followed up as soon as possible or go to the Emergency Department if any problems should occur. ? ?Please show the CHEMOTHERAPY ALERT CARD or IMMUNOTHERAPY ALERT CARD at check-in  to the Emergency Department and triage nurse. ? ?Should you have questions after your visit or need to cancel or reschedule your appointment, please contact [facility]  Dept: [phone]  and follow the prompts.  Office hours are 8:00 a.m. to 4:30 p.m. Monday - Friday. Please note that voicemails left after 4:00 p.m. may not be returned until the following business day.  We are closed weekends and major holidays. You have access to a nurse at all times for urgent questions. Please call the main number to the clinic Dept: [phone] and follow the prompts. ? ? ?For any non-urgent questions, you may also contact your provider using MyChart. We now offer e-Visits for anyone 18 and older to request care online for non-urgent symptoms. For details visit [email or web address]. ?  ?Also download the MyChart app! Go to the app store, search "MyChart", open the app, select [facility], and log in with your MyChart username and password. ? ?Due to Covid, a mask is required upon entering the hospital/clinic. If you do not have a mask, one will be given to you upon arrival. For doctor visits, patients may have 1 support person aged 18 or older with them. For treatment visits, patients cannot have anyone with them due to current Covid guidelines and our immunocompromised population.  ? ?

## 2021-03-18 ENCOUNTER — Other Ambulatory Visit: Payer: Self-pay | Admitting: Oncology

## 2021-03-21 ENCOUNTER — Encounter: Payer: Self-pay | Admitting: Oncology

## 2021-03-22 ENCOUNTER — Other Ambulatory Visit: Payer: Self-pay | Admitting: Internal Medicine

## 2021-03-22 DIAGNOSIS — I10 Essential (primary) hypertension: Secondary | ICD-10-CM

## 2021-03-29 ENCOUNTER — Encounter: Payer: Self-pay | Admitting: Internal Medicine

## 2021-03-29 ENCOUNTER — Other Ambulatory Visit: Payer: Self-pay

## 2021-03-29 ENCOUNTER — Ambulatory Visit (INDEPENDENT_AMBULATORY_CARE_PROVIDER_SITE_OTHER): Payer: Medicare Other | Admitting: Internal Medicine

## 2021-03-29 VITALS — BP 138/82 | HR 63 | Temp 97.8°F | Ht 69.0 in | Wt 206.0 lb

## 2021-03-29 DIAGNOSIS — I10 Essential (primary) hypertension: Secondary | ICD-10-CM | POA: Diagnosis not present

## 2021-03-29 DIAGNOSIS — Z1211 Encounter for screening for malignant neoplasm of colon: Secondary | ICD-10-CM | POA: Insufficient documentation

## 2021-03-29 DIAGNOSIS — N401 Enlarged prostate with lower urinary tract symptoms: Secondary | ICD-10-CM | POA: Diagnosis not present

## 2021-03-29 DIAGNOSIS — E785 Hyperlipidemia, unspecified: Secondary | ICD-10-CM | POA: Diagnosis not present

## 2021-03-29 DIAGNOSIS — E876 Hypokalemia: Secondary | ICD-10-CM

## 2021-03-29 DIAGNOSIS — R3912 Poor urinary stream: Secondary | ICD-10-CM | POA: Diagnosis not present

## 2021-03-29 DIAGNOSIS — T502X5A Adverse effect of carbonic-anhydrase inhibitors, benzothiadiazides and other diuretics, initial encounter: Secondary | ICD-10-CM | POA: Insufficient documentation

## 2021-03-29 LAB — MAGNESIUM: Magnesium: 1.8 mg/dL (ref 1.5–2.5)

## 2021-03-29 LAB — BASIC METABOLIC PANEL
BUN: 18 mg/dL (ref 6–23)
CO2: 29 mEq/L (ref 19–32)
Calcium: 10 mg/dL (ref 8.4–10.5)
Chloride: 100 mEq/L (ref 96–112)
Creatinine, Ser: 1.19 mg/dL (ref 0.40–1.50)
GFR: 61.35 mL/min (ref >60.00)
Glucose, Bld: 96 mg/dL (ref 70–99)
Potassium: 3.6 mEq/L (ref 3.5–5.1)
Sodium: 138 mEq/L (ref 135–145)

## 2021-03-29 LAB — LIPID PANEL
Cholesterol: 150 mg/dL (ref 0–200)
HDL: 41.3 mg/dL (ref >39.00)
LDL Cholesterol: 91 mg/dL (ref 0–99)
NonHDL: 108.52
Total CHOL/HDL Ratio: 4
Triglycerides: 87 mg/dL (ref 0.0–149.0)
VLDL: 17.4 mg/dL (ref 0.0–40.0)

## 2021-03-29 LAB — PSA: PSA: 3.91 ng/mL (ref 0.10–4.00)

## 2021-03-29 MED ORDER — AMLODIPINE BESYLATE 10 MG PO TABS
10.0000 mg | ORAL_TABLET | Freq: Every day | ORAL | 0 refills | Status: DC
Start: 2021-03-29 — End: 2021-08-13

## 2021-03-29 MED ORDER — METOPROLOL TARTRATE 50 MG PO TABS
50.0000 mg | ORAL_TABLET | Freq: Two times a day (BID) | ORAL | 0 refills | Status: DC
Start: 2021-03-29 — End: 2021-09-13

## 2021-03-29 MED ORDER — ATORVASTATIN CALCIUM 10 MG PO TABS
10.0000 mg | ORAL_TABLET | Freq: Every day | ORAL | 1 refills | Status: DC
Start: 2021-03-29 — End: 2021-09-13

## 2021-03-29 MED ORDER — INDAPAMIDE 2.5 MG PO TABS
2.5000 mg | ORAL_TABLET | Freq: Every day | ORAL | 0 refills | Status: DC
Start: 2021-03-29 — End: 2021-09-13

## 2021-03-29 NOTE — Patient Instructions (Signed)

## 2021-03-29 NOTE — Progress Notes (Signed)
Subjective:  Patient ID: Mario Hubbard, male    DOB: 10-Oct-1950  Age: 71 y.o. MRN: [id number]  CC: Hypertension  This visit occurred during the SARS-CoV-2 public health emergency.  Safety protocols were in place, including screening questions prior to the visit, additional usage of staff PPE, and extensive cleaning of exam room while observing appropriate contact time as indicated for disinfecting solutions.    HPI Mario Hubbard presents for f/up -   He tells me his blood pressure has been well controlled.  He is active and denies chest pain, shortness of breath, palpitations, diaphoresis, or edema.  Outpatient Medications Prior to Visit  Medication Sig Dispense Refill   KLOR-CON M20 20 MEQ tablet TAKE 1 TABLET BY MOUTH EVERY DAY 30 tablet 1   losartan (COZAAR) 100 MG tablet Take 100 mg by mouth daily.     sildenafil (VIAGRA) 50 MG tablet Take 50 mg by mouth daily as needed for erectile dysfunction.     amLODipine (NORVASC) 10 MG tablet Take 1 tablet (10 mg total) by mouth daily. 90 tablet 0   atorvastatin (LIPITOR) 10 MG tablet Take 1 tablet (10 mg total) by mouth daily. 90 tablet 1   indapamide (LOZOL) 2.5 MG tablet TAKE 1 TABLET BY MOUTH EVERY DAY 90 tablet 0   metoprolol tartrate (LOPRESSOR) 50 MG tablet Take 1 tablet (50 mg total) by mouth 2 (two) times daily. 180 tablet 0   potassium gluconate 595 (99 K) MG TABS tablet TAKE 1 TABLET (595 MG TOTAL) BY MOUTH DAILY. 110 tablet 1   No facility-administered medications prior to visit.    ROS Review of Systems  Constitutional:  Negative for diaphoresis, fatigue and unexpected weight change.  HENT: Negative.    Eyes: Negative.   Respiratory:  Negative for cough, chest tightness, shortness of breath and wheezing.   Cardiovascular:  Negative for chest pain, palpitations and leg swelling.  Gastrointestinal:  Negative for abdominal pain, constipation, diarrhea, nausea and vomiting.  Endocrine: Negative.   Genitourinary: Negative.   Negative for difficulty urinating and dysuria.  Musculoskeletal:  Negative for back pain and myalgias.  Skin: Negative.  Negative for color change.  Neurological:  Negative for dizziness, weakness, light-headedness and headaches.  Hematological:  Negative for adenopathy. Does not bruise/bleed easily.  Psychiatric/Behavioral: Negative.     Objective:  BP 138/82 (BP Location: Right Arm, Patient Position: Sitting, Cuff Size: Large)    Pulse 63    Temp 97.8 F (36.6 C) (Oral)    Ht 5\' 9"  (1.753 m)    Wt 206 lb (93.4 kg)    SpO2 96%    BMI 30.42 kg/m   BP Readings from Last 3 Encounters:  03/29/21 138/82  03/15/21 136/62  02/23/21 136/71    Wt Readings from Last 3 Encounters:  03/29/21 206 lb (93.4 kg)  03/15/21 205 lb (93 kg)  02/23/21 206 lb 3.2 oz (93.5 kg)    Physical Exam Vitals reviewed.  HENT:     Nose: Nose normal.     Mouth/Throat:     Mouth: Mucous membranes are moist.  Eyes:     General: No scleral icterus.    Conjunctiva/sclera: Conjunctivae normal.  Cardiovascular:     Rate and Rhythm: Normal rate and regular rhythm.     Heart sounds: No murmur heard. Pulmonary:     Effort: Pulmonary effort is normal.     Breath sounds: No stridor. No wheezing, rhonchi or rales.  Abdominal:     General: Abdomen is flat.  Palpations: There is no mass.     Tenderness: There is no abdominal tenderness. There is no guarding or rebound.     Hernia: No hernia is present.  Musculoskeletal:        General: Normal range of motion.     Cervical back: Neck supple.     Right lower leg: No edema.     Left lower leg: No edema.  Lymphadenopathy:     Cervical: No cervical adenopathy.  Skin:    General: Skin is warm and dry.     Coloration: Skin is not pale.  Neurological:     General: No focal deficit present.     Mental Status: He is alert.  Psychiatric:        Mood and Affect: Mood normal.        Behavior: Behavior normal.    Lab Results  Component Value Date   WBC 5.7  03/15/2021   HGB 13.3 03/15/2021   HCT 39.7 03/15/2021   PLT 208 03/15/2021   GLUCOSE 96 03/29/2021   CHOL 150 03/29/2021   TRIG 87.0 03/29/2021   HDL 41.30 03/29/2021   LDLCALC 91 03/29/2021   ALT 15 03/15/2021   AST 17 03/15/2021   NA 138 03/29/2021   K 3.6 03/29/2021   CL 100 03/29/2021   CREATININE 1.19 03/29/2021   BUN 18 03/29/2021   CO2 29 03/29/2021   TSH 1.821 03/15/2021   PSA 3.91 03/29/2021    CT ABDOMEN PELVIS WO CONTRAST  Result Date: 03/10/2021 CLINICAL DATA:  History of urethral cancer. Assess treatment response. Radiation therapy completed. Chemotherapy ongoing. EXAM: CT CHEST, ABDOMEN AND PELVIS WITHOUT CONTRAST TECHNIQUE: Multidetector CT imaging of the chest, abdomen and pelvis was performed following the standard protocol without IV contrast. COMPARISON:  CT 09/21/2020 FINDINGS: CT CHEST FINDINGS Cardiovascular: Atherosclerosis of the aorta, great vessels and coronary arteries. Left subclavian Port-A-Cath extends to the level of the mid SVC. The heart size is normal. There is no pericardial effusion. Mediastinum/Nodes: There are no enlarged mediastinal, hilar or axillary lymph nodes. Small mediastinal lymph nodes are unchanged. Low-density right thyroid lesion measures 2.6 x 1.5 cm on image 6/2. This is partially obscured by artifact on the previous study, although grossly unchanged. Stable small hiatal hernia. The trachea appears unremarkable. Lungs/Pleura: There is no pleural effusion. No pleural effusion or pneumothorax. There is stable mild centrilobular and paraseptal emphysema with scattered parenchymal scarring. No suspicious pulmonary nodules. Musculoskeletal/Chest wall: No chest wall mass or suspicious osseous findings. There is a stable lipoma posteriorly in the mid left chest wall measuring up to 8.0 cm on coronal image 146/5. CT ABDOMEN AND PELVIS FINDINGS Hepatobiliary: The liver appears unremarkable as imaged in the noncontrast state. No evidence of  gallstones, gallbladder wall thickening or biliary dilatation. Pancreas: Unremarkable. No pancreatic ductal dilatation or surrounding inflammatory changes. Spleen: Normal in size without focal abnormality. Adrenals/Urinary Tract: Both adrenal glands appear normal. Stable 2.8 cm cyst in the upper pole of the left kidney. No evidence of urinary tract calculus or hydronephrosis. The bladder appears unremarkable for its degree of distention. Stomach/Bowel: Enteric contrast was administered and has passed into the rectum. The stomach appears unremarkable for its degree of distension. No evidence of bowel wall thickening, distention or surrounding inflammatory change. The appendix appears normal. Stable mild distal colonic diverticulosis. Vascular/Lymphatic: There are no enlarged abdominal or pelvic lymph nodes. Small retroperitoneal and pelvic lymph nodes appear unchanged. There are surgical clips in the left groin. Diffuse aortic and branch  vessel atherosclerosis without acute vascular findings on noncontrast imaging. Reproductive: Stable mild enlargement of the prostate gland. The seminal vesicles appear unremarkable. Other: Stable small in bili cul hernia containing only fat. No ascites. Musculoskeletal: No acute or significant osseous findings. Mild degenerative changes in the hips and spine. IMPRESSION: 1. Stable examination without evidence of local recurrence or metastatic disease. 2. 2.6 cm right thyroid nodule is grossly unchanged from previous studies. If not previously performed, Recommend thyroid US.(Ref: J Am Coll Radiol. 2015 Feb;12(2): 143-50). 3. 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 4. Coronary, Aortic Atherosclerosis (ICD10-I70.0) and Emphysema (ICD10-J43.9). Electronically Signed   By: Richardean Sale M.D.   On: 03/10/2021 09:15   CT Chest Wo Contrast  Result Date: 03/10/2021 CLINICAL DATA:  History of urethral cancer. Assess treatment  response. Radiation therapy completed. Chemotherapy ongoing. EXAM: CT CHEST, ABDOMEN AND PELVIS WITHOUT CONTRAST TECHNIQUE: Multidetector CT imaging of the chest, abdomen and pelvis was performed following the standard protocol without IV contrast. COMPARISON:  CT 09/21/2020 FINDINGS: CT CHEST FINDINGS Cardiovascular: Atherosclerosis of the aorta, great vessels and coronary arteries. Left subclavian Port-A-Cath extends to the level of the mid SVC. The heart size is normal. There is no pericardial effusion. Mediastinum/Nodes: There are no enlarged mediastinal, hilar or axillary lymph nodes. Small mediastinal lymph nodes are unchanged. Low-density right thyroid lesion measures 2.6 x 1.5 cm on image 6/2. This is partially obscured by artifact on the previous study, although grossly unchanged. Stable small hiatal hernia. The trachea appears unremarkable. Lungs/Pleura: There is no pleural effusion. No pleural effusion or pneumothorax. There is stable mild centrilobular and paraseptal emphysema with scattered parenchymal scarring. No suspicious pulmonary nodules. Musculoskeletal/Chest wall: No chest wall mass or suspicious osseous findings. There is a stable lipoma posteriorly in the mid left chest wall measuring up to 8.0 cm on coronal image 146/5. CT ABDOMEN AND PELVIS FINDINGS Hepatobiliary: The liver appears unremarkable as imaged in the noncontrast state. No evidence of gallstones, gallbladder wall thickening or biliary dilatation. Pancreas: Unremarkable. No pancreatic ductal dilatation or surrounding inflammatory changes. Spleen: Normal in size without focal abnormality. Adrenals/Urinary Tract: Both adrenal glands appear normal. Stable 2.8 cm cyst in the upper pole of the left kidney. No evidence of urinary tract calculus or hydronephrosis. The bladder appears unremarkable for its degree of distention. Stomach/Bowel: Enteric contrast was administered and has passed into the rectum. The stomach appears unremarkable  for its degree of distension. No evidence of bowel wall thickening, distention or surrounding inflammatory change. The appendix appears normal. Stable mild distal colonic diverticulosis. Vascular/Lymphatic: There are no enlarged abdominal or pelvic lymph nodes. Small retroperitoneal and pelvic lymph nodes appear unchanged. There are surgical clips in the left groin. Diffuse aortic and branch vessel atherosclerosis without acute vascular findings on noncontrast imaging. Reproductive: Stable mild enlargement of the prostate gland. The seminal vesicles appear unremarkable. Other: Stable small in bili cul hernia containing only fat. No ascites. Musculoskeletal: No acute or significant osseous findings. Mild degenerative changes in the hips and spine. IMPRESSION: 1. Stable examination without evidence of local recurrence or metastatic disease. 2. 2.6 cm right thyroid nodule is grossly unchanged from previous studies. If not previously performed, Recommend thyroid US.(Ref: J Am Coll Radiol. 2015 Feb;12(2): 143-50). 3. 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 4. Coronary, Aortic Atherosclerosis (ICD10-I70.0) and Emphysema (ICD10-J43.9). Electronically Signed   By: Richardean Sale M.D.   On: 03/10/2021 09:15  Assessment & Plan:   Jene was seen today for hypertension.  Diagnoses and all orders for this visit:  Benign prostatic hyperplasia with weak urinary stream- His PSA is normal. -     PSA; Future -     PSA  Primary hypertension- His blood pressure is adequately well controlled.  Electrolytes and renal function are normal. -     amLODipine (NORVASC) 10 MG tablet; Take 1 tablet (10 mg total) by mouth daily. -     indapamide (LOZOL) 2.5 MG tablet; Take 1 tablet (2.5 mg total) by mouth daily. -     metoprolol tartrate (LOPRESSOR) 50 MG tablet; Take 1 tablet (50 mg total) by mouth 2 (two) times daily. -     Basic metabolic panel; Future -      Magnesium; Future -     Magnesium -     Basic metabolic panel  Hyperlipidemia LDL goal <100- LDL goal achieved. Doing well on the statin  -     atorvastatin (LIPITOR) 10 MG tablet; Take 1 tablet (10 mg total) by mouth daily. -     Lipid panel; Future -     Lipid panel  Diuretic-induced hypokalemia- His K+ is normal now. -     Basic metabolic panel; Future -     Magnesium; Future -     Magnesium -     Basic metabolic panel  Screen for colon cancer -     Ambulatory referral to Gastroenterology   I have discontinued Niklaus Stacy's potassium gluconate. I have also changed his indapamide. Additionally, I am having him maintain his sildenafil, losartan, Klor-Con M20, amLODipine, atorvastatin, and metoprolol tartrate.  Meds ordered this encounter  Medications   amLODipine (NORVASC) 10 MG tablet    Sig: Take 1 tablet (10 mg total) by mouth daily.    Dispense:  90 tablet    Refill:  0   atorvastatin (LIPITOR) 10 MG tablet    Sig: Take 1 tablet (10 mg total) by mouth daily.    Dispense:  90 tablet    Refill:  1   indapamide (LOZOL) 2.5 MG tablet    Sig: Take 1 tablet (2.5 mg total) by mouth daily.    Dispense:  90 tablet    Refill:  0   metoprolol tartrate (LOPRESSOR) 50 MG tablet    Sig: Take 1 tablet (50 mg total) by mouth 2 (two) times daily.    Dispense:  180 tablet    Refill:  0     Follow-up: Return in about 6 months (around 09/27/2021).  Scarlette Calico, MD

## 2021-03-30 ENCOUNTER — Encounter: Payer: Self-pay | Admitting: Internal Medicine

## 2021-04-06 ENCOUNTER — Inpatient Hospital Stay: Payer: Medicare Other

## 2021-04-06 ENCOUNTER — Inpatient Hospital Stay (HOSPITAL_BASED_OUTPATIENT_CLINIC_OR_DEPARTMENT_OTHER): Payer: Medicare Other | Admitting: Oncology

## 2021-04-06 ENCOUNTER — Other Ambulatory Visit: Payer: Self-pay

## 2021-04-06 VITALS — BP 124/64 | HR 54 | Temp 96.9°F | Resp 18 | Wt 205.8 lb

## 2021-04-06 DIAGNOSIS — C680 Malignant neoplasm of urethra: Secondary | ICD-10-CM | POA: Diagnosis not present

## 2021-04-06 DIAGNOSIS — Z95828 Presence of other vascular implants and grafts: Secondary | ICD-10-CM

## 2021-04-06 DIAGNOSIS — Z5112 Encounter for antineoplastic immunotherapy: Secondary | ICD-10-CM | POA: Diagnosis not present

## 2021-04-06 DIAGNOSIS — E032 Hypothyroidism due to medicaments and other exogenous substances: Secondary | ICD-10-CM

## 2021-04-06 DIAGNOSIS — C609 Malignant neoplasm of penis, unspecified: Secondary | ICD-10-CM

## 2021-04-06 LAB — CMP (CANCER CENTER ONLY)
ALT: 15 U/L (ref 0–44)
AST: 16 U/L (ref 15–41)
Albumin: 4 g/dL (ref 3.5–5.0)
Alkaline Phosphatase: 74 U/L (ref 38–126)
Anion gap: 7 (ref 5–15)
BUN: 16 mg/dL (ref 8–23)
CO2: 30 mmol/L (ref 22–32)
Calcium: 9.5 mg/dL (ref 8.9–10.3)
Chloride: 102 mmol/L (ref 98–111)
Creatinine: 1.17 mg/dL (ref 0.61–1.24)
GFR, Estimated: >60 mL/min (ref >60)
Glucose, Bld: 115 mg/dL — ABNORMAL HIGH (ref 70–99)
Potassium: 3.1 mmol/L — ABNORMAL LOW (ref 3.5–5.1)
Sodium: 139 mmol/L (ref 135–145)
Total Bilirubin: 0.4 mg/dL (ref 0.3–1.2)
Total Protein: 7.3 g/dL (ref 6.5–8.1)

## 2021-04-06 LAB — CBC WITH DIFFERENTIAL (CANCER CENTER ONLY)
Abs Immature Granulocytes: 0.01 10*3/uL (ref 0.00–0.07)
Basophils Absolute: 0 10*3/uL (ref 0.0–0.1)
Basophils Relative: 0 %
Eosinophils Absolute: 0.3 10*3/uL (ref 0.0–0.5)
Eosinophils Relative: 7 %
HCT: 39.9 % (ref 39.0–52.0)
Hemoglobin: 13.2 g/dL (ref 13.0–17.0)
Immature Granulocytes: 0 %
Lymphocytes Relative: 35 %
Lymphs Abs: 1.8 10*3/uL (ref 0.7–4.0)
MCH: 28.3 pg (ref 26.0–34.0)
MCHC: 33.1 g/dL (ref 30.0–36.0)
MCV: 85.6 fL (ref 80.0–100.0)
Monocytes Absolute: 0.4 10*3/uL (ref 0.1–1.0)
Monocytes Relative: 8 %
Neutro Abs: 2.7 10*3/uL (ref 1.7–7.7)
Neutrophils Relative %: 50 %
Platelet Count: 198 10*3/uL (ref 150–400)
RBC: 4.66 MIL/uL (ref 4.22–5.81)
RDW: 13.8 % (ref 11.5–15.5)
WBC Count: 5.3 10*3/uL (ref 4.0–10.5)
nRBC: 0 % (ref 0.0–0.2)

## 2021-04-06 LAB — TSH: TSH: 1.444 u[IU]/mL (ref 0.320–4.118)

## 2021-04-06 MED ORDER — SODIUM CHLORIDE 0.9% FLUSH
10.0000 mL | Freq: Once | INTRAVENOUS | Status: AC
  Administered 2021-04-06: 08:00:00 10 mL

## 2021-04-06 MED ORDER — SODIUM CHLORIDE 0.9 % IV SOLN
1200.0000 mg | Freq: Once | INTRAVENOUS | Status: AC
  Administered 2021-04-06: 10:00:00 1200 mg via INTRAVENOUS
  Filled 2021-04-06: qty 20

## 2021-04-06 MED ORDER — SODIUM CHLORIDE 0.9% FLUSH: 10.0000 mL | INTRAVENOUS | Status: DC | PRN

## 2021-04-06 MED ORDER — HEPARIN SOD (PORK) LOCK FLUSH 100 UNIT/ML IV SOLN: 500.0000 [IU] | Freq: Once | INTRAVENOUS | Status: DC | PRN

## 2021-04-06 MED ORDER — SODIUM CHLORIDE 0.9 % IV SOLN: Freq: Once | INTRAVENOUS | Status: AC

## 2021-04-06 NOTE — Patient Instructions (Signed)
[facility]  Discharge Instructions: ?Thank you for choosing [facility] to provide your oncology and hematology care.  ? ?If you have a lab appointment with the Cancer Center, please go directly to the Cancer Center and check in at the registration area. ?  ?Wear comfortable clothing and clothing appropriate for easy access to any Portacath or PICC line.  ? ?We strive to give you quality time with your provider. You may need to reschedule your appointment if you arrive late (15 or more minutes).  Arriving late affects you and other patients whose appointments are after yours.  Also, if you miss three or more appointments without notifying the office, you may be dismissed from the clinic at the provider?s discretion.    ?  ?For prescription refill requests, have your pharmacy contact our office and allow 72 hours for refills to be completed.   ? ?Today you received the following chemotherapy and/or immunotherapy agents: atezolizumab    ?  ?To help prevent nausea and vomiting after your treatment, we encourage you to take your nausea medication as directed. ? ?BELOW ARE SYMPTOMS THAT SHOULD BE REPORTED IMMEDIATELY: ?*FEVER GREATER THAN 100.4 F (38 ?C) OR HIGHER ?*CHILLS OR SWEATING ?*NAUSEA AND VOMITING THAT IS NOT CONTROLLED WITH YOUR NAUSEA MEDICATION ?*UNUSUAL SHORTNESS OF BREATH ?*UNUSUAL BRUISING OR BLEEDING ?*URINARY PROBLEMS (pain or burning when urinating, or frequent urination) ?*BOWEL PROBLEMS (unusual diarrhea, constipation, pain near the anus) ?TENDERNESS IN MOUTH AND THROAT WITH OR WITHOUT PRESENCE OF ULCERS (sore throat, sores in mouth, or a toothache) ?UNUSUAL RASH, SWELLING OR PAIN  ?UNUSUAL VAGINAL DISCHARGE OR ITCHING  ? ?Items with * indicate a potential emergency and should be followed up as soon as possible or go to the Emergency Department if any problems should occur. ? ?Please show the CHEMOTHERAPY ALERT CARD or IMMUNOTHERAPY ALERT CARD at check-in  to the Emergency Department and triage nurse. ? ?Should you have questions after your visit or need to cancel or reschedule your appointment, please contact [facility]  Dept: [phone]  and follow the prompts.  Office hours are 8:00 a.m. to 4:30 p.m. Monday - Friday. Please note that voicemails left after 4:00 p.m. may not be returned until the following business day.  We are closed weekends and major holidays. You have access to a nurse at all times for urgent questions. Please call the main number to the clinic Dept: [phone] and follow the prompts. ? ? ?For any non-urgent questions, you may also contact your provider using MyChart. We now offer e-Visits for anyone 18 and older to request care online for non-urgent symptoms. For details visit [email or web address]. ?  ?Also download the MyChart app! Go to the app store, search "MyChart", open the app, select , and log in with your MyChart username and password. ? ?Due to Covid, a mask is required upon entering the hospital/clinic. If you do not have a mask, one will be given to you upon arrival. For doctor visits, patients may have 1 support person aged 18 or older with them. For treatment visits, patients cannot have anyone with them due to current Covid guidelines and our immunocompromised population.  ? ?

## 2021-04-06 NOTE — Progress Notes (Signed)
Hematology and Oncology Follow Up Visit  Abdiaziz Klahn [id number] [birth year] 70 y.o. 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   Initially presented with localized disease in 2015.  Prior Therapy: He underwent surgical resection followed by radiation therapy in 2015.  He subsequently developed metastatic disease with right-sided pelvic lymph node and confirmed the presence of relapsed disease that is PET avid in 2017.   He was treated with carboplatin and Taxol initially with excellent response after 3 cycles. 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   He did show worsening disease in October 2018 and subsequently was started on Tecentriq. He relocated to [place] and received Tecentriq maintenance therapy with excellent response. CT scan in August 2021 continues to show no evidence of disease.  Current therapy: Tecentriq 1200 mg every 3 weeks started in 2018 while living in [place].  He is receiving it locally started on February 09, 2020.  He is here for the subsequent cycle of treatment.   Interim History: Mr. Inglett returns today for repeat evaluation.  Since the last visit, he reports no major changes in his health.  He continues to tolerate current therapy without any recent complaints.  He denies any nausea, vomiting or shortness of breath.  He denies any worsening neuropathy or hospitalizations.  He continues to enjoy excellent quality of life and performance status.   Medications: Reviewed without changes. Current Outpatient Medications  Medication Sig Dispense Refill   amLODipine (NORVASC) 10 MG tablet Take 1 tablet (10 mg total) by mouth daily. 90 tablet 0   atorvastatin (LIPITOR) 10 MG tablet Take 1 tablet (10 mg total) by mouth daily. 90 tablet 1   indapamide (LOZOL) 2.5 MG  tablet Take 1 tablet (2.5 mg total) by mouth daily. 90 tablet 0   KLOR-CON M20 20 MEQ tablet TAKE 1 TABLET BY MOUTH EVERY DAY 30 tablet 1   losartan (COZAAR) 100 MG tablet Take 100 mg by mouth daily.     metoprolol tartrate (LOPRESSOR) 50 MG tablet Take 1 tablet (50 mg total) by mouth 2 (two) times daily. 180 tablet 0   sildenafil (VIAGRA) 50 MG tablet Take 50 mg by mouth daily as needed for erectile dysfunction.     No current facility-administered medications for this visit.     Allergies: No Known Allergies    Physical Exam:      Blood pressure 124/64, pulse (!) 54, temperature (!) 96.9 F (36.1 C), temperature source Tympanic, resp. rate 18, weight 205 lb 12.8 oz (93.4 kg), SpO2 97 %.      ECOG: 0    General appearance: Comfortable appearing without any discomfort Head: Normocephalic without any trauma Oropharynx: Mucous membranes are moist and pink without any thrush or ulcers. Eyes: Pupils are equal and round reactive to light. Lymph nodes: No cervical, supraclavicular, inguinal or axillary lymphadenopathy.   Heart:regular rate and rhythm.  S1 and S2 without leg edema. Lung: Clear without any rhonchi or wheezes.  No dullness to percussion. Abdomin: Soft, nontender, nondistended with good bowel sounds.  No hepatosplenomegaly. Musculoskeletal: No joint deformity or effusion.  Full range of motion noted. Neurological: No deficits noted on motor, sensory and deep tendon reflex exam. Skin: No petechial rash or dryness.  Appeared moist.  Lab Results: Lab Results  Component Value Date   WBC 5.7 03/15/2021   HGB 13.3 03/15/2021   HCT 39.7 03/15/2021   MCV 85.9 03/15/2021   PLT 208 03/15/2021     Chemistry      Component Value Date/Time   NA 138 03/29/2021 1331   K 3.6 03/29/2021 1331   CL 100 03/29/2021 1331   CO2 29 03/29/2021 1331   BUN 18 03/29/2021 1331   CREATININE 1.19 03/29/2021 1331   CREATININE 1.12  03/15/2021 0745      Component Value Date/Time   CALCIUM 10.0 03/29/2021 1331   ALKPHOS 82 03/15/2021 0745   AST 17 03/15/2021 0745   ALT 15 03/15/2021 0745   BILITOT 0.4 03/15/2021 0745        Impression and Plan:  70 year old with:   1.  Stage IV high-grade urothelial carcinoma with pelvic adenopathy diagnosed in 2017.  The natural course of his disease was reviewed today and treatment choices were discussed.  Risks and benefits of continuing this treatment were reiterated.  Immune mediated complications as well as GI toxicity among others were discussed.  Alternative treatment options including active surveillance versus systemic chemotherapy about progression.  For the time being, we opted to continue with same dose and schedule given his excellent tolerance.   2. IV access: Port-A-Cath remains in place without any issues.   3. Antiemetics: No nausea or vomiting reported at this time.  Compazine is available to him.   4.  Immune mediated complications.  Complications such as pneumonitis, colitis and thyroid disease continue to be reiterated.  He is not experiencing any.  We will continue to monitor and educate about the symptoms.  5.  Prognosis and goals of care: His disease is incurable although aggressive measures are warranted.  6.  Hypokalemia: His potassium is within normal range on December 2022 and will continue to monitor.  7. Follow-up: He will return in 3 weeks for the subsequent cycle of therapy.   30  minutes were spent on this visit.  The time was dedicated to reviewing laboratory data, disease status update, treatment choices and outlining future plan of care discussion.   Zola Button, MD 12/29/20227:41 AM

## 2021-04-16 ENCOUNTER — Other Ambulatory Visit: Payer: Self-pay | Admitting: Oncology

## 2021-04-17 ENCOUNTER — Encounter: Payer: Self-pay | Admitting: Oncology

## 2021-04-21 ENCOUNTER — Encounter: Payer: Self-pay | Admitting: Oncology

## 2021-04-26 ENCOUNTER — Inpatient Hospital Stay: Payer: Medicare HMO

## 2021-04-26 ENCOUNTER — Encounter: Payer: Self-pay | Admitting: Oncology

## 2021-04-26 ENCOUNTER — Other Ambulatory Visit: Payer: Self-pay

## 2021-04-26 ENCOUNTER — Inpatient Hospital Stay (HOSPITAL_BASED_OUTPATIENT_CLINIC_OR_DEPARTMENT_OTHER): Payer: Medicare HMO | Admitting: Oncology

## 2021-04-26 ENCOUNTER — Inpatient Hospital Stay: Payer: Medicare HMO | Attending: Oncology

## 2021-04-26 VITALS — BP 124/51 | HR 62 | Temp 97.9°F | Resp 17 | Ht 69.0 in | Wt 204.8 lb

## 2021-04-26 DIAGNOSIS — Z95828 Presence of other vascular implants and grafts: Secondary | ICD-10-CM

## 2021-04-26 DIAGNOSIS — E032 Hypothyroidism due to medicaments and other exogenous substances: Secondary | ICD-10-CM

## 2021-04-26 DIAGNOSIS — C609 Malignant neoplasm of penis, unspecified: Secondary | ICD-10-CM

## 2021-04-26 DIAGNOSIS — C679 Malignant neoplasm of bladder, unspecified: Secondary | ICD-10-CM | POA: Diagnosis not present

## 2021-04-26 DIAGNOSIS — C680 Malignant neoplasm of urethra: Secondary | ICD-10-CM | POA: Diagnosis not present

## 2021-04-26 DIAGNOSIS — Z79899 Other long term (current) drug therapy: Secondary | ICD-10-CM | POA: Diagnosis not present

## 2021-04-26 DIAGNOSIS — Z5112 Encounter for antineoplastic immunotherapy: Secondary | ICD-10-CM | POA: Diagnosis not present

## 2021-04-26 LAB — CMP (CANCER CENTER ONLY)
ALT: 14 U/L (ref 0–44)
AST: 16 U/L (ref 15–41)
Albumin: 4 g/dL (ref 3.5–5.0)
Alkaline Phosphatase: 79 U/L (ref 38–126)
Anion gap: 8 (ref 5–15)
BUN: 19 mg/dL (ref 8–23)
CO2: 30 mmol/L (ref 22–32)
Calcium: 9.5 mg/dL (ref 8.9–10.3)
Chloride: 102 mmol/L (ref 98–111)
Creatinine: 1.26 mg/dL — ABNORMAL HIGH (ref 0.61–1.24)
GFR, Estimated: >60 mL/min (ref >60)
Glucose, Bld: 140 mg/dL — ABNORMAL HIGH (ref 70–99)
Potassium: 3.1 mmol/L — ABNORMAL LOW (ref 3.5–5.1)
Sodium: 140 mmol/L (ref 135–145)
Total Bilirubin: 0.4 mg/dL (ref 0.3–1.2)
Total Protein: 7.2 g/dL (ref 6.5–8.1)

## 2021-04-26 LAB — CBC WITH DIFFERENTIAL (CANCER CENTER ONLY)
Abs Immature Granulocytes: 0.01 10*3/uL (ref 0.00–0.07)
Basophils Absolute: 0 10*3/uL (ref 0.0–0.1)
Basophils Relative: 0 %
Eosinophils Absolute: 0.3 10*3/uL (ref 0.0–0.5)
Eosinophils Relative: 6 %
HCT: 40 % (ref 39.0–52.0)
Hemoglobin: 13.1 g/dL (ref 13.0–17.0)
Immature Granulocytes: 0 %
Lymphocytes Relative: 33 %
Lymphs Abs: 1.8 10*3/uL (ref 0.7–4.0)
MCH: 28.2 pg (ref 26.0–34.0)
MCHC: 32.8 g/dL (ref 30.0–36.0)
MCV: 86 fL (ref 80.0–100.0)
Monocytes Absolute: 0.4 10*3/uL (ref 0.1–1.0)
Monocytes Relative: 8 %
Neutro Abs: 3 10*3/uL (ref 1.7–7.7)
Neutrophils Relative %: 53 %
Platelet Count: 212 10*3/uL (ref 150–400)
RBC: 4.65 MIL/uL (ref 4.22–5.81)
RDW: 13.9 % (ref 11.5–15.5)
WBC Count: 5.6 10*3/uL (ref 4.0–10.5)
nRBC: 0 % (ref 0.0–0.2)

## 2021-04-26 LAB — TSH: TSH: 1.651 u[IU]/mL (ref 0.320–4.118)

## 2021-04-26 MED ORDER — SODIUM CHLORIDE 0.9% FLUSH
10.0000 mL | INTRAVENOUS | Status: DC | PRN
  Administered 2021-04-26: 10 mL

## 2021-04-26 MED ORDER — HEPARIN SOD (PORK) LOCK FLUSH 100 UNIT/ML IV SOLN
500.0000 [IU] | Freq: Once | INTRAVENOUS | Status: AC | PRN
  Administered 2021-04-26: 500 [IU]

## 2021-04-26 MED ORDER — SODIUM CHLORIDE 0.9 % IV SOLN
1200.0000 mg | Freq: Once | INTRAVENOUS | Status: AC
  Administered 2021-04-26: 1200 mg via INTRAVENOUS
  Filled 2021-04-26: qty 20

## 2021-04-26 MED ORDER — SODIUM CHLORIDE 0.9 % IV SOLN: Freq: Once | INTRAVENOUS | Status: AC

## 2021-04-26 MED ORDER — SODIUM CHLORIDE 0.9% FLUSH
10.0000 mL | Freq: Once | INTRAVENOUS | Status: AC
  Administered 2021-04-26: 10 mL

## 2021-04-26 NOTE — Patient Instructions (Signed)
[facility]  Discharge Instructions: ?Thank you for choosing [facility] to provide your oncology and hematology care.  ? ?If you have a lab appointment with the Cancer Center, please go directly to the Cancer Center and check in at the registration area. ?  ?Wear comfortable clothing and clothing appropriate for easy access to any Portacath or PICC line.  ? ?We strive to give you quality time with your provider. You may need to reschedule your appointment if you arrive late (15 or more minutes).  Arriving late affects you and other patients whose appointments are after yours.  Also, if you miss three or more appointments without notifying the office, you may be dismissed from the clinic at the provider?s discretion.    ?  ?For prescription refill requests, have your pharmacy contact our office and allow 72 hours for refills to be completed.   ? ?Today you received the following chemotherapy and/or immunotherapy agents: atezolizumab    ?  ?To help prevent nausea and vomiting after your treatment, we encourage you to take your nausea medication as directed. ? ?BELOW ARE SYMPTOMS THAT SHOULD BE REPORTED IMMEDIATELY: ?*FEVER GREATER THAN 100.4 F (38 ?C) OR HIGHER ?*CHILLS OR SWEATING ?*NAUSEA AND VOMITING THAT IS NOT CONTROLLED WITH YOUR NAUSEA MEDICATION ?*UNUSUAL SHORTNESS OF BREATH ?*UNUSUAL BRUISING OR BLEEDING ?*URINARY PROBLEMS (pain or burning when urinating, or frequent urination) ?*BOWEL PROBLEMS (unusual diarrhea, constipation, pain near the anus) ?TENDERNESS IN MOUTH AND THROAT WITH OR WITHOUT PRESENCE OF ULCERS (sore throat, sores in mouth, or a toothache) ?UNUSUAL RASH, SWELLING OR PAIN  ?UNUSUAL VAGINAL DISCHARGE OR ITCHING  ? ?Items with * indicate a potential emergency and should be followed up as soon as possible or go to the Emergency Department if any problems should occur. ? ?Please show the CHEMOTHERAPY ALERT CARD or IMMUNOTHERAPY ALERT CARD at check-in  to the Emergency Department and triage nurse. ? ?Should you have questions after your visit or need to cancel or reschedule your appointment, please contact [facility]  Dept: [phone]  and follow the prompts.  Office hours are 8:00 a.m. to 4:30 p.m. Monday - Friday. Please note that voicemails left after 4:00 p.m. may not be returned until the following business day.  We are closed weekends and major holidays. You have access to a nurse at all times for urgent questions. Please call the main number to the clinic Dept: [phone] and follow the prompts. ? ? ?For any non-urgent questions, you may also contact your provider using MyChart. We now offer e-Visits for anyone 18 and older to request care online for non-urgent symptoms. For details visit [email or web address]. ?  ?Also download the MyChart app! Go to the app store, search "MyChart", open the app, select [facility], and log in with your MyChart username and password. ? ?Due to Covid, a mask is required upon entering the hospital/clinic. If you do not have a mask, one will be given to you upon arrival. For doctor visits, patients may have 1 support person aged 18 or older with them. For treatment visits, patients cannot have anyone with them due to current Covid guidelines and our immunocompromised population.  ? ?

## 2021-04-26 NOTE — Progress Notes (Signed)
Hematology and Oncology Follow Up Visit  Mario Hubbard [id number] [birth year] 71 y.o. 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     Prior Therapy: He underwent surgical resection followed by radiation therapy in 2015.  He subsequently developed metastatic disease with Hubbard-sided pelvic lymph node and confirmed the presence of relapsed disease that is PET avid in 2017.   He was treated with carboplatin and Taxol initially with excellent response after 3 cycles. 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   He did show worsening disease in October 2018 and subsequently was started on Tecentriq. He relocated to [place] and received Tecentriq maintenance therapy with excellent response. CT scan in August 2021 continues to show no evidence of disease.  Current therapy: Tecentriq 1200 mg every 3 weeks started in 2018 while living in [place].  He is receiving it locally started on February 09, 2020.  He returns for the next treatment cycle.   Interim History: Mr. Mario Hubbard presents today for a follow-up visit.  Since last visit, he reports no major changes in his health.  He continues to tolerate therapy without any major complaints.  He denies any nausea, vomiting or abdominal pain.  He denies any hospitalizations or illnesses.  He denies any excessive fatigue or tiredness.  He denies any skin rash or changes in his bowels.   Medications: Updated on review. Current Outpatient Medications  Medication Sig Dispense Refill   amLODipine (NORVASC) 10 MG tablet Take 1 tablet (10 mg total) by mouth daily. 90 tablet 0   atorvastatin (LIPITOR) 10 MG tablet Take 1 tablet (10 mg total) by mouth daily. 90 tablet 1   indapamide (LOZOL) 2.5 MG tablet Take 1 tablet (2.5 mg total)  by mouth daily. 90 tablet 0   KLOR-CON M20 20 MEQ tablet TAKE 1 TABLET BY MOUTH EVERY DAY 30 tablet 1   losartan (COZAAR) 100 MG tablet Take 100 mg by mouth daily.     metoprolol tartrate (LOPRESSOR) 50 MG tablet Take 1 tablet (50 mg total) by mouth 2 (two) times daily. 180 tablet 0   sildenafil (VIAGRA) 50 MG tablet Take 50 mg by mouth daily as needed for erectile dysfunction.     No current facility-administered medications for this visit.     Allergies: No Known Allergies    Physical Exam:     Blood pressure (!) 124/51, pulse 62, temperature 97.9 F (36.6 C), resp. rate 17, height 5\' 9"  (1.753 m), weight 204 lb 12.8 oz (92.9 kg), SpO2 98 %.        ECOG: 0    General appearance: Alert, awake without any distress. Head: Atraumatic without abnormalities Oropharynx: Without any thrush or ulcers. Eyes: No scleral icterus. Lymph nodes: No lymphadenopathy noted in the cervical, supraclavicular, or axillary nodes Heart:regular rate and rhythm, without any murmurs or gallops.   Lung: Clear to auscultation without any rhonchi, wheezes or dullness to percussion. Abdomin: Soft, nontender without any shifting dullness or ascites. Musculoskeletal: No clubbing or cyanosis. Neurological: No motor or sensory deficits. Skin: No rashes or lesions.                         Lab Results: Lab Results  Component Value Date   WBC 5.3 04/06/2021   HGB 13.2 04/06/2021  HCT 39.9 04/06/2021   MCV 85.6 04/06/2021   PLT 198 04/06/2021     Chemistry      Component Value Date/Time   NA 139 04/06/2021 0750   K 3.1 (L) 04/06/2021 0750   CL 102 04/06/2021 0750   CO2 30 04/06/2021 0750   BUN 16 04/06/2021 0750   CREATININE 1.17 04/06/2021 0750      Component Value Date/Time   CALCIUM 9.5 04/06/2021 0750   ALKPHOS 74 04/06/2021 0750   AST 16 04/06/2021 0750   ALT 15 04/06/2021 0750   BILITOT 0.4 04/06/2021 0750        Impression and Plan:  71 year old  with:   1.  Ureteral cancer diagnosed in 2015.  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  His last scan was done in December 2022 and will be repeated in 6 months from that date.  Therapy discontinuation was also discussed but he is agreeable to continue for the time being.    2. IV access: Port-A-Cath continues to be used without any issues.   3. Antiemetics: Compazine is available to her without any nausea or vomiting.   4.  Immune mediated complications.  Continue to educate him about pneumonitis, colitis and thyroid disease.  Not experiencing any or any another immune mediated issues.  5.  Prognosis and goals of care: Therapy remains palliative though aggressive measures are warranted given his excellent response and that he had a complete response to therapy.  6.  Hypokalemia: Remains mild and currently on replacement therapy.  7. Follow-up: In 3 weeks for repeat follow-up.   30  minutes were dedicated to this encounter.  The time was spent on reviewing laboratory data, disease status update, treatment choices and future plan of care discussion.   Zola Button, MD 1/18/20237:36 AM

## 2021-05-03 ENCOUNTER — Other Ambulatory Visit: Payer: Self-pay | Admitting: Internal Medicine

## 2021-05-11 ENCOUNTER — Other Ambulatory Visit: Payer: Self-pay | Admitting: Oncology

## 2021-05-11 DIAGNOSIS — Z125 Encounter for screening for malignant neoplasm of prostate: Secondary | ICD-10-CM | POA: Diagnosis not present

## 2021-05-17 ENCOUNTER — Inpatient Hospital Stay: Payer: Medicare HMO

## 2021-05-17 ENCOUNTER — Inpatient Hospital Stay: Payer: Medicare HMO | Admitting: Oncology

## 2021-05-17 ENCOUNTER — Other Ambulatory Visit: Payer: Self-pay

## 2021-05-17 ENCOUNTER — Inpatient Hospital Stay: Payer: Medicare HMO | Attending: Oncology

## 2021-05-17 VITALS — BP 127/89 | HR 74 | Temp 97.5°F | Resp 16 | Ht 69.0 in | Wt 205.9 lb

## 2021-05-17 DIAGNOSIS — Z79899 Other long term (current) drug therapy: Secondary | ICD-10-CM | POA: Diagnosis not present

## 2021-05-17 DIAGNOSIS — E876 Hypokalemia: Secondary | ICD-10-CM | POA: Diagnosis not present

## 2021-05-17 DIAGNOSIS — C609 Malignant neoplasm of penis, unspecified: Secondary | ICD-10-CM

## 2021-05-17 DIAGNOSIS — C680 Malignant neoplasm of urethra: Secondary | ICD-10-CM

## 2021-05-17 DIAGNOSIS — C679 Malignant neoplasm of bladder, unspecified: Secondary | ICD-10-CM | POA: Diagnosis not present

## 2021-05-17 DIAGNOSIS — Z5112 Encounter for antineoplastic immunotherapy: Secondary | ICD-10-CM | POA: Diagnosis not present

## 2021-05-17 DIAGNOSIS — Z95828 Presence of other vascular implants and grafts: Secondary | ICD-10-CM

## 2021-05-17 DIAGNOSIS — E032 Hypothyroidism due to medicaments and other exogenous substances: Secondary | ICD-10-CM

## 2021-05-17 LAB — CMP (CANCER CENTER ONLY)
ALT: 11 U/L (ref 0–44)
AST: 16 U/L (ref 15–41)
Albumin: 4.1 g/dL (ref 3.5–5.0)
Alkaline Phosphatase: 79 U/L (ref 38–126)
Anion gap: 6 (ref 5–15)
BUN: 23 mg/dL (ref 8–23)
CO2: 30 mmol/L (ref 22–32)
Calcium: 9.6 mg/dL (ref 8.9–10.3)
Chloride: 102 mmol/L (ref 98–111)
Creatinine: 1.18 mg/dL (ref 0.61–1.24)
GFR, Estimated: >60 mL/min (ref >60)
Glucose, Bld: 113 mg/dL — ABNORMAL HIGH (ref 70–99)
Potassium: 3.3 mmol/L — ABNORMAL LOW (ref 3.5–5.1)
Sodium: 138 mmol/L (ref 135–145)
Total Bilirubin: 0.5 mg/dL (ref 0.3–1.2)
Total Protein: 7.2 g/dL (ref 6.5–8.1)

## 2021-05-17 LAB — CBC WITH DIFFERENTIAL (CANCER CENTER ONLY)
Abs Immature Granulocytes: 0.01 10*3/uL (ref 0.00–0.07)
Basophils Absolute: 0 10*3/uL (ref 0.0–0.1)
Basophils Relative: 0 %
Eosinophils Absolute: 0.4 10*3/uL (ref 0.0–0.5)
Eosinophils Relative: 8 %
HCT: 39.4 % (ref 39.0–52.0)
Hemoglobin: 13 g/dL (ref 13.0–17.0)
Immature Granulocytes: 0 %
Lymphocytes Relative: 30 %
Lymphs Abs: 1.7 10*3/uL (ref 0.7–4.0)
MCH: 28.4 pg (ref 26.0–34.0)
MCHC: 33 g/dL (ref 30.0–36.0)
MCV: 86.2 fL (ref 80.0–100.0)
Monocytes Absolute: 0.5 10*3/uL (ref 0.1–1.0)
Monocytes Relative: 9 %
Neutro Abs: 3 10*3/uL (ref 1.7–7.7)
Neutrophils Relative %: 53 %
Platelet Count: 194 10*3/uL (ref 150–400)
RBC: 4.57 MIL/uL (ref 4.22–5.81)
RDW: 13.8 % (ref 11.5–15.5)
WBC Count: 5.7 10*3/uL (ref 4.0–10.5)
nRBC: 0 % (ref 0.0–0.2)

## 2021-05-17 LAB — TSH: TSH: 1.239 u[IU]/mL (ref 0.320–4.118)

## 2021-05-17 MED ORDER — SODIUM CHLORIDE 0.9 % IV SOLN: Freq: Once | INTRAVENOUS | Status: AC

## 2021-05-17 MED ORDER — SODIUM CHLORIDE 0.9% FLUSH
10.0000 mL | Freq: Once | INTRAVENOUS | Status: AC
  Administered 2021-05-17: 10 mL

## 2021-05-17 MED ORDER — HEPARIN SOD (PORK) LOCK FLUSH 100 UNIT/ML IV SOLN
500.0000 [IU] | Freq: Once | INTRAVENOUS | Status: AC | PRN
  Administered 2021-05-17: 500 [IU]

## 2021-05-17 MED ORDER — SODIUM CHLORIDE 0.9 % IV SOLN
1200.0000 mg | Freq: Once | INTRAVENOUS | Status: AC
  Administered 2021-05-17: 1200 mg via INTRAVENOUS
  Filled 2021-05-17: qty 20

## 2021-05-17 MED ORDER — SODIUM CHLORIDE 0.9% FLUSH
10.0000 mL | INTRAVENOUS | Status: DC | PRN
  Administered 2021-05-17: 10 mL

## 2021-05-17 NOTE — Progress Notes (Signed)
Hematology and Oncology Follow Up Visit  Mario Hubbard [id number] 01-Apr-1951 71 y.o. 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  He presented with localized disease in 2015.   Prior Therapy: He underwent surgical resection followed by radiation therapy in 2015.  He subsequently developed metastatic disease with Hubbard-sided pelvic lymph node and confirmed the presence of relapsed disease that is PET avid in 2017.   He was treated with carboplatin and Taxol initially with excellent response after 3 cycles. 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   He did show worsening disease in October 2018 and subsequently was started on Tecentriq. He relocated to [place] and received Tecentriq maintenance therapy with excellent response. CT scan in August 2021 continues to show no evidence of disease.  Current therapy: Tecentriq 1200 mg every 3 weeks started in 2018 while living in [place].  He is receiving it locally started on February 09, 2020.  He is here for the subsequent cycle of treatment.   Interim History: Mario Hubbard returns today for repeat evaluation.  Since the last visit, he reports feeling well without any complaints.  He denies any nausea, vomiting or abdominal pain.  He denies any skin rash or ecchymosis.  He denies any excessive fatigue or change in respiratory status.  He remains active and attends activities of daily living.   Medications: Reviewed without changes. Current Outpatient Medications  Medication Sig Dispense Refill   amLODipine (NORVASC) 10 MG tablet Take 1 tablet (10 mg total) by mouth daily. 90 tablet 0   atorvastatin (LIPITOR) 10 MG tablet Take 1 tablet (10 mg total) by mouth daily. 90 tablet 1   indapamide (LOZOL) 2.5 MG tablet Take 1 tablet (2.5 mg total)  by mouth daily. 90 tablet 0   KLOR-CON M20 20 MEQ tablet TAKE 1 TABLET BY MOUTH EVERY DAY 30 tablet 1   losartan (COZAAR) 100 MG tablet TAKE 1 TABLET BY MOUTH EVERY DAY 90 tablet 0   metoprolol tartrate (LOPRESSOR) 50 MG tablet Take 1 tablet (50 mg total) by mouth 2 (two) times daily. 180 tablet 0   sildenafil (VIAGRA) 50 MG tablet Take 50 mg by mouth daily as needed for erectile dysfunction.     No current facility-administered medications for this visit.     Allergies: No Known Allergies    Physical Exam:  Blood pressure 127/89, pulse 74, temperature (!) 97.5 F (36.4 C), temperature source Axillary, resp. rate 16, height 5\' 9"  (1.753 m), weight 205 lb 14.4 oz (93.4 kg), SpO2 98 %.    ECOG: 0   General appearance: Comfortable appearing without any discomfort Head: Normocephalic without any trauma Oropharynx: Mucous membranes are moist and pink without any thrush or ulcers. Eyes: Pupils are equal and round reactive to light. Lymph nodes: No cervical, supraclavicular, inguinal or axillary lymphadenopathy.   Heart:regular rate and rhythm.  S1 and S2 without leg edema. Lung: Clear without any rhonchi or wheezes.  No dullness to percussion. Abdomin: Soft, nontender, nondistended with good bowel sounds.  No hepatosplenomegaly. Musculoskeletal: No joint deformity or effusion.  Full range of motion noted. Neurological: No deficits noted on motor, sensory and deep tendon reflex exam. Skin: No petechial rash or dryness.  Appeared moist.  Lab Results: Lab Results  Component Value Date   WBC 5.6 04/26/2021   HGB 13.1 04/26/2021   HCT 40.0 04/26/2021   MCV 86.0 04/26/2021   PLT 212 04/26/2021     Chemistry      Component Value Date/Time   NA 140 04/26/2021 0733   K 3.1 (L) 04/26/2021 0733   CL 102 04/26/2021 0733   CO2 30 04/26/2021 0733   BUN 19 04/26/2021 0733   CREATININE 1.26 (H) 04/26/2021 0733      Component Value  Date/Time   CALCIUM 9.5 04/26/2021 0733   ALKPHOS 79 04/26/2021 0733   AST 16 04/26/2021 0733   ALT 14 04/26/2021 0733   BILITOT 0.4 04/26/2021 0733        Impression and Plan:  71 year old with:   1.  Stage IV ureteral cancer diagnosed in 2017.  He was found to have high-grade urothelial carcinoma with pelvic adenopathy   The natural course of his disease was reviewed at this time and treatment choices were discussed.  Continues to tolerate immunotherapy without any major complaints and alternative therapies such as active surveillance were reviewed.  Different salvage therapy options including antibody drug conjugate and traditional chemotherapy were reiterated.  The plan is to update his staging scan in June 2023 and continue the same dose and schedule.  He is agreeable with this plan.   2. IV access: Port-A-Cath remains in place without any issues.   3. Antiemetics: No nausea or vomiting reported.  Compazine is available to him.   4.  Immune mediated complications.  These complications including pneumonitis, colitis and thyroid disease were reiterated.  He has not experienced any.  5.  Prognosis and goals of care: His disease is incurable although aggressive measures are warranted given his excellent performance status and excellent response.  6.  Hypokalemia: He continues to be on replacement with mild hypokalemia.  7. Follow-up: He will return in 3 weeks for the next cycle of therapy.   30  minutes were dedicated to this encounter.  The time was spent on reviewing his disease status, reviewing laboratory data and addressing complications related to cancer and cancer therapy.  Zola Button, MD 2/8/20238:08 AM

## 2021-05-17 NOTE — Patient Instructions (Signed)
[facility]  Discharge Instructions: ?Thank you for choosing [facility] to provide your oncology and hematology care.  ? ?If you have a lab appointment with the Cancer Center, please go directly to the Cancer Center and check in at the registration area. ?  ?Wear comfortable clothing and clothing appropriate for easy access to any Portacath or PICC line.  ? ?We strive to give you quality time with your provider. You may need to reschedule your appointment if you arrive late (15 or more minutes).  Arriving late affects you and other patients whose appointments are after yours.  Also, if you miss three or more appointments without notifying the office, you may be dismissed from the clinic at the provider?s discretion.    ?  ?For prescription refill requests, have your pharmacy contact our office and allow 72 hours for refills to be completed.   ? ?Today you received the following chemotherapy and/or immunotherapy agents: Atezolizumab (Tecentriq)   ?  ?To help prevent nausea and vomiting after your treatment, we encourage you to take your nausea medication as directed. ? ?BELOW ARE SYMPTOMS THAT SHOULD BE REPORTED IMMEDIATELY: ?*FEVER GREATER THAN 100.4 F (38 ?C) OR HIGHER ?*CHILLS OR SWEATING ?*NAUSEA AND VOMITING THAT IS NOT CONTROLLED WITH YOUR NAUSEA MEDICATION ?*UNUSUAL SHORTNESS OF BREATH ?*UNUSUAL BRUISING OR BLEEDING ?*URINARY PROBLEMS (pain or burning when urinating, or frequent urination) ?*BOWEL PROBLEMS (unusual diarrhea, constipation, pain near the anus) ?TENDERNESS IN MOUTH AND THROAT WITH OR WITHOUT PRESENCE OF ULCERS (sore throat, sores in mouth, or a toothache) ?UNUSUAL RASH, SWELLING OR PAIN  ?UNUSUAL VAGINAL DISCHARGE OR ITCHING  ? ?Items with * indicate a potential emergency and should be followed up as soon as possible or go to the Emergency Department if any problems should occur. ? ?Please show the CHEMOTHERAPY ALERT CARD or IMMUNOTHERAPY ALERT CARD  at check-in to the Emergency Department and triage nurse. ? ?Should you have questions after your visit or need to cancel or reschedule your appointment, please contact [facility]  Dept: [phone]  and follow the prompts.  Office hours are 8:00 a.m. to 4:30 p.m. Monday - Friday. Please note that voicemails left after 4:00 p.m. may not be returned until the following business day.  We are closed weekends and major holidays. You have access to a nurse at all times for urgent questions. Please call the main number to the clinic Dept: [phone] and follow the prompts. ? ? ?For any non-urgent questions, you may also contact your provider using MyChart. We now offer e-Visits for anyone 18 and older to request care online for non-urgent symptoms. For details visit [email or web address]. ?  ?Also download the MyChart app! Go to the app store, search "MyChart", open the app, select , and log in with your MyChart username and password. ? ?Due to Covid, a mask is required upon entering the hospital/clinic. If you do not have a mask, one will be given to you upon arrival. For doctor visits, patients may have 1 support person aged 18 or older with them. For treatment visits, patients cannot have anyone with them due to current Covid guidelines and our immunocompromised population.  ? ?

## 2021-05-18 DIAGNOSIS — C680 Malignant neoplasm of urethra: Secondary | ICD-10-CM | POA: Diagnosis not present

## 2021-05-18 DIAGNOSIS — N5201 Erectile dysfunction due to arterial insufficiency: Secondary | ICD-10-CM | POA: Diagnosis not present

## 2021-06-03 ENCOUNTER — Other Ambulatory Visit: Payer: Self-pay | Admitting: Oncology

## 2021-06-05 ENCOUNTER — Encounter: Payer: Self-pay | Admitting: Oncology

## 2021-06-08 ENCOUNTER — Encounter: Payer: Self-pay | Admitting: Oncology

## 2021-06-08 ENCOUNTER — Inpatient Hospital Stay: Payer: Medicare HMO | Attending: Oncology

## 2021-06-08 ENCOUNTER — Inpatient Hospital Stay: Payer: Medicare HMO

## 2021-06-08 ENCOUNTER — Other Ambulatory Visit: Payer: Self-pay

## 2021-06-08 ENCOUNTER — Inpatient Hospital Stay (HOSPITAL_BASED_OUTPATIENT_CLINIC_OR_DEPARTMENT_OTHER): Payer: Medicare HMO | Admitting: Oncology

## 2021-06-08 VITALS — BP 126/68 | HR 63 | Temp 97.7°F | Resp 19 | Ht 69.0 in | Wt 205.2 lb

## 2021-06-08 DIAGNOSIS — C680 Malignant neoplasm of urethra: Secondary | ICD-10-CM

## 2021-06-08 DIAGNOSIS — E032 Hypothyroidism due to medicaments and other exogenous substances: Secondary | ICD-10-CM | POA: Diagnosis not present

## 2021-06-08 DIAGNOSIS — Z79899 Other long term (current) drug therapy: Secondary | ICD-10-CM | POA: Insufficient documentation

## 2021-06-08 DIAGNOSIS — E876 Hypokalemia: Secondary | ICD-10-CM | POA: Diagnosis not present

## 2021-06-08 DIAGNOSIS — C609 Malignant neoplasm of penis, unspecified: Secondary | ICD-10-CM

## 2021-06-08 DIAGNOSIS — Z95828 Presence of other vascular implants and grafts: Secondary | ICD-10-CM

## 2021-06-08 DIAGNOSIS — C679 Malignant neoplasm of bladder, unspecified: Secondary | ICD-10-CM | POA: Insufficient documentation

## 2021-06-08 DIAGNOSIS — Z5112 Encounter for antineoplastic immunotherapy: Secondary | ICD-10-CM | POA: Insufficient documentation

## 2021-06-08 DIAGNOSIS — G629 Polyneuropathy, unspecified: Secondary | ICD-10-CM | POA: Insufficient documentation

## 2021-06-08 LAB — CBC WITH DIFFERENTIAL (CANCER CENTER ONLY)
Abs Immature Granulocytes: 0.01 10*3/uL (ref 0.00–0.07)
Basophils Absolute: 0 10*3/uL (ref 0.0–0.1)
Basophils Relative: 1 %
Eosinophils Absolute: 0.3 10*3/uL (ref 0.0–0.5)
Eosinophils Relative: 5 %
HCT: 40.7 % (ref 39.0–52.0)
Hemoglobin: 13.4 g/dL (ref 13.0–17.0)
Immature Granulocytes: 0 %
Lymphocytes Relative: 33 %
Lymphs Abs: 2.2 10*3/uL (ref 0.7–4.0)
MCH: 28.5 pg (ref 26.0–34.0)
MCHC: 32.9 g/dL (ref 30.0–36.0)
MCV: 86.4 fL (ref 80.0–100.0)
Monocytes Absolute: 0.7 10*3/uL (ref 0.1–1.0)
Monocytes Relative: 10 %
Neutro Abs: 3.4 10*3/uL (ref 1.7–7.7)
Neutrophils Relative %: 51 %
Platelet Count: 209 10*3/uL (ref 150–400)
RBC: 4.71 MIL/uL (ref 4.22–5.81)
RDW: 13.8 % (ref 11.5–15.5)
WBC Count: 6.6 10*3/uL (ref 4.0–10.5)
nRBC: 0 % (ref 0.0–0.2)

## 2021-06-08 LAB — CMP (CANCER CENTER ONLY)
ALT: 10 U/L (ref 0–44)
AST: 13 U/L — ABNORMAL LOW (ref 15–41)
Albumin: 4.3 g/dL (ref 3.5–5.0)
Alkaline Phosphatase: 85 U/L (ref 38–126)
Anion gap: 8 (ref 5–15)
BUN: 24 mg/dL — ABNORMAL HIGH (ref 8–23)
CO2: 29 mmol/L (ref 22–32)
Calcium: 10.1 mg/dL (ref 8.9–10.3)
Chloride: 103 mmol/L (ref 98–111)
Creatinine: 1.2 mg/dL (ref 0.61–1.24)
GFR, Estimated: >60 mL/min (ref >60)
Glucose, Bld: 132 mg/dL — ABNORMAL HIGH (ref 70–99)
Potassium: 3.1 mmol/L — ABNORMAL LOW (ref 3.5–5.1)
Sodium: 140 mmol/L (ref 135–145)
Total Bilirubin: 0.4 mg/dL (ref 0.3–1.2)
Total Protein: 7.4 g/dL (ref 6.5–8.1)

## 2021-06-08 LAB — TSH: TSH: 1.395 u[IU]/mL (ref 0.320–4.118)

## 2021-06-08 MED ORDER — SODIUM CHLORIDE 0.9% FLUSH
10.0000 mL | Freq: Once | INTRAVENOUS | Status: AC
  Administered 2021-06-08: 10 mL

## 2021-06-08 MED ORDER — SODIUM CHLORIDE 0.9 % IV SOLN
1200.0000 mg | Freq: Once | INTRAVENOUS | Status: AC
  Administered 2021-06-08: 1200 mg via INTRAVENOUS
  Filled 2021-06-08: qty 20

## 2021-06-08 MED ORDER — SODIUM CHLORIDE 0.9 % IV SOLN: Freq: Once | INTRAVENOUS | Status: AC

## 2021-06-08 NOTE — Progress Notes (Signed)
Hematology and Oncology Follow Up Visit ? ?[name] ?[id number] ?Apr 24, 1950 71 y.o. ?06/08/2021 7:56 AM ?[name], MDJones, [name], MD  ? ?Principle Diagnosis: 71 year old man with ureteral cancer diagnosed in 2015.  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 ? ? ?Prior Therapy: ?He underwent surgical resection followed by radiation therapy in 2015. ? ?He subsequently developed metastatic disease with right-sided pelvic lymph node and confirmed the presence of relapsed disease that is PET avid in 2017.  ? ?He was treated with carboplatin and Taxol initially with excellent response after 3 cycles. 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  ? ?He did show worsening disease in October 2018 and subsequently was started on Tecentriq. He relocated to [place] and received Tecentriq maintenance therapy with excellent response. CT scan in August 2021 continues to show no evidence of disease. ? ?Current therapy: Tecentriq 1200 mg every 3 weeks started in 2018 while living in [place].  He is receiving it locally started on February 09, 2020.  He presents today for the next cycle of therapy. ? ? ?Interim History: Mr. Ausborn presents today for repeat evaluation.  Since the last visit, he reports feeling well without any major complaints.  He continues to tolerate immunotherapy without any complaints.  He denies any nausea, fatigue or hospitalizations.  He denies any skin rashes or lesions.  He denies any arthralgias or myalgias.  He remains active and attends to activities of daily living. ? ? ?Medications: Updated on review. ?Current Outpatient Medications  ?Medication Sig Dispense Refill  ? amLODipine (NORVASC) 10 MG tablet Take 1 tablet (10 mg total) by mouth daily. 90 tablet 0  ? atorvastatin (LIPITOR) 10 MG tablet Take 1 tablet (10 mg total) by mouth daily. 90 tablet 1  ? indapamide (LOZOL) 2.5 MG tablet Take 1  tablet (2.5 mg total) by mouth daily. 90 tablet 0  ? KLOR-CON M20 20 MEQ tablet TAKE 1 TABLET BY MOUTH EVERY DAY 30 tablet 1  ? losartan (COZAAR) 100 MG tablet TAKE 1 TABLET BY MOUTH EVERY DAY 90 tablet 0  ? metoprolol tartrate (LOPRESSOR) 50 MG tablet Take 1 tablet (50 mg total) by mouth 2 (two) times daily. 180 tablet 0  ? sildenafil (VIAGRA) 50 MG tablet Take 50 mg by mouth daily as needed for erectile dysfunction.    ? ?No current facility-administered medications for this visit.  ? ? ? ?Allergies: No Known Allergies ? ? ? ?Physical Exam: ? ? ?Blood pressure 126/68, pulse 63, temperature 97.7 ?F (36.5 ?C), temperature source Tympanic, resp. rate 19, height 5\' 9"  (1.753 m), weight 205 lb 3.2 oz (93.1 kg), SpO2 97 %. ? ? ? ?ECOG: 0 ? ? ?General appearance: Alert, awake without any distress. ?Head: Atraumatic without abnormalities ?Oropharynx: Without any thrush or ulcers. ?Eyes: No scleral icterus. ?Lymph nodes: No lymphadenopathy noted in the cervical, supraclavicular, or axillary nodes ?Heart:regular rate and rhythm, without any murmurs or gallops.   ?Lung: Clear to auscultation without any rhonchi, wheezes or dullness to percussion. ?Abdomin: Soft, nontender without any shifting dullness or ascites. ?Musculoskeletal: No clubbing or cyanosis. ?Neurological: No motor or sensory deficits. ?Skin: No rashes or lesions. ? ? ? ? ? ? ? ? ? ? ? ? ? ? ? ? ? ? ? ? ? ? ? ?Lab Results: ?Lab Results  ?Component Value Date  ? WBC 5.7 05/17/2021  ? HGB 13.0 05/17/2021  ? HCT 39.4 05/17/2021  ?  MCV 86.2 05/17/2021  ? PLT 194 05/17/2021  ? ?  Chemistry   ?   ?Component Value Date/Time  ? NA 138 05/17/2021 0759  ? K 3.3 (L) 05/17/2021 0759  ? CL 102 05/17/2021 0759  ? CO2 30 05/17/2021 0759  ? BUN 23 05/17/2021 0759  ? CREATININE 1.18 05/17/2021 0759  ?    ?Component Value Date/Time  ? CALCIUM 9.6 05/17/2021 0759  ? ALKPHOS 79 05/17/2021 0759  ? AST 16 05/17/2021 0759  ? ALT 11 05/17/2021 0759  ? BILITOT 0.5 05/17/2021 0759  ?   ? ? ? ? ?Impression and Plan: ? ?71 year old with: ?  ?1.  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 ? ?He continues to tolerate Tecentriq without any major complications.  Risks and benefits of continuing this treatment were reviewed the plan is to update his scan in June 2023.  Therapy discontinuation versus different salvage therapy options will be discussed pending the results of the scan.  After discussion today he is agreeable to proceed. ?  ?2. IV access: Port-A-Cath currently in use without any issues. ?  ?3. Antiemetics: Compazine is available to him without any nausea or vomiting. ? ? ?4.  Immune mediated complications.  I continue to educate him about potential complications including pneumonitis, colitis and thyroid disease.  He is not experiencing any at this time. ? ?5.  Prognosis and goals of care: Therapy remains palliative although aggressive measures are warranted given his excellent response and the long disease remission he is experiencing. ? ? ?6.  Hypokalemia: Remains mild and on thyroid replacement.  His hypokalemia is related to his treatment. ? ?7. Follow-up: He will return in 3 weeks for the next cycle of therapy. ? ? ?30  minutes were spent on this visit.  The time was dedicated to reviewing laboratory data, disease status update, treatment choices and addressing complications related to his cancer and cancer therapy. ? ?Zola Button, MD ?3/2/20237:56 AM ? ?

## 2021-06-08 NOTE — Progress Notes (Signed)
Received message back from Harold that there is an assistance program through [place] that may possibly help with his balance. She will send to patient and provider via email to complete and return for submission. ? ?Called patient to confirm email address and explained process. He verbalized understanding of process. ? ?He has my contact name and number for any additional financial questions or concerns. ?

## 2021-06-08 NOTE — Patient Instructions (Signed)
[facility]  Discharge Instructions: ?Thank you for choosing [facility] to provide your oncology and hematology care.  ? ?If you have a lab appointment with the Cancer Center, please go directly to the Cancer Center and check in at the registration area. ?  ?Wear comfortable clothing and clothing appropriate for easy access to any Portacath or PICC line.  ? ?We strive to give you quality time with your provider. You may need to reschedule your appointment if you arrive late (15 or more minutes).  Arriving late affects you and other patients whose appointments are after yours.  Also, if you miss three or more appointments without notifying the office, you may be dismissed from the clinic at the provider?s discretion.    ?  ?For prescription refill requests, have your pharmacy contact our office and allow 72 hours for refills to be completed.   ? ?Today you received the following chemotherapy and/or immunotherapy agents: Atezolizumab (Tecentriq)   ?  ?To help prevent nausea and vomiting after your treatment, we encourage you to take your nausea medication as directed. ? ?BELOW ARE SYMPTOMS THAT SHOULD BE REPORTED IMMEDIATELY: ?*FEVER GREATER THAN 100.4 F (38 ?C) OR HIGHER ?*CHILLS OR SWEATING ?*NAUSEA AND VOMITING THAT IS NOT CONTROLLED WITH YOUR NAUSEA MEDICATION ?*UNUSUAL SHORTNESS OF BREATH ?*UNUSUAL BRUISING OR BLEEDING ?*URINARY PROBLEMS (pain or burning when urinating, or frequent urination) ?*BOWEL PROBLEMS (unusual diarrhea, constipation, pain near the anus) ?TENDERNESS IN MOUTH AND THROAT WITH OR WITHOUT PRESENCE OF ULCERS (sore throat, sores in mouth, or a toothache) ?UNUSUAL RASH, SWELLING OR PAIN  ?UNUSUAL VAGINAL DISCHARGE OR ITCHING  ? ?Items with * indicate a potential emergency and should be followed up as soon as possible or go to the Emergency Department if any problems should occur. ? ?Please show the CHEMOTHERAPY ALERT CARD or IMMUNOTHERAPY ALERT CARD  at check-in to the Emergency Department and triage nurse. ? ?Should you have questions after your visit or need to cancel or reschedule your appointment, please contact [facility]  Dept: [phone]  and follow the prompts.  Office hours are 8:00 a.m. to 4:30 p.m. Monday - Friday. Please note that voicemails left after 4:00 p.m. may not be returned until the following business day.  We are closed weekends and major holidays. You have access to a nurse at all times for urgent questions. Please call the main number to the clinic Dept: [phone] and follow the prompts. ? ? ?For any non-urgent questions, you may also contact your provider using MyChart. We now offer e-Visits for anyone 18 and older to request care online for non-urgent symptoms. For details visit [email or web address]. ?  ?Also download the MyChart app! Go to the app store, search "MyChart", open the app, select [facility], and log in with your MyChart username and password. ? ?Due to Covid, a mask is required upon entering the hospital/clinic. If you do not have a mask, one will be given to you upon arrival. For doctor visits, patients may have 1 support person aged 18 or older with them. For treatment visits, patients cannot have anyone with them due to current Covid guidelines and our immunocompromised population.  ? ?

## 2021-06-08 NOTE — Progress Notes (Signed)
Patient called seeking assistance regarding treatment expense. ? ?Discussed one-time $1000 [employer] grant to assist with personal expenses which could help free up money for medical. Based on verbal income guidelines, patient exceeds. ? ?There are currently no copay programs available. Advised I would check to see if there is patient assistance available for excessive OOP and call him back. He verbalized understanding. ? ?Message sent to Zannie Cove whom is covering drug assistance. ?

## 2021-06-20 ENCOUNTER — Encounter: Payer: Self-pay | Admitting: Oncology

## 2021-06-20 NOTE — Progress Notes (Signed)
Patient is approved Replacement Medication. ?Medication: Tecentriq ?Manufacturer: [employer] ?Approval Dates: Approved from 06/16/2021 until therapy discontinued. ?ID: [id number] ?First DOS: 06/16/2021  ?

## 2021-06-21 DIAGNOSIS — Z79620 Long term (current) use of immunosuppressive biologic: Secondary | ICD-10-CM | POA: Diagnosis not present

## 2021-06-21 DIAGNOSIS — E785 Hyperlipidemia, unspecified: Secondary | ICD-10-CM | POA: Diagnosis not present

## 2021-06-21 DIAGNOSIS — E669 Obesity, unspecified: Secondary | ICD-10-CM | POA: Diagnosis not present

## 2021-06-21 DIAGNOSIS — N529 Male erectile dysfunction, unspecified: Secondary | ICD-10-CM | POA: Diagnosis not present

## 2021-06-21 DIAGNOSIS — Z008 Encounter for other general examination: Secondary | ICD-10-CM | POA: Diagnosis not present

## 2021-06-21 DIAGNOSIS — Z8249 Family history of ischemic heart disease and other diseases of the circulatory system: Secondary | ICD-10-CM | POA: Diagnosis not present

## 2021-06-21 DIAGNOSIS — Z6830 Body mass index (BMI) 30.0-30.9, adult: Secondary | ICD-10-CM | POA: Diagnosis not present

## 2021-06-21 DIAGNOSIS — I10 Essential (primary) hypertension: Secondary | ICD-10-CM | POA: Diagnosis not present

## 2021-06-21 DIAGNOSIS — C680 Malignant neoplasm of urethra: Secondary | ICD-10-CM | POA: Diagnosis not present

## 2021-06-21 DIAGNOSIS — Z87891 Personal history of nicotine dependence: Secondary | ICD-10-CM | POA: Diagnosis not present

## 2021-06-28 ENCOUNTER — Inpatient Hospital Stay: Payer: Medicare HMO

## 2021-06-28 ENCOUNTER — Inpatient Hospital Stay: Payer: Medicare HMO | Admitting: Oncology

## 2021-06-28 ENCOUNTER — Other Ambulatory Visit: Payer: Self-pay | Admitting: Oncology

## 2021-06-28 ENCOUNTER — Other Ambulatory Visit: Payer: Self-pay

## 2021-06-28 VITALS — BP 138/61 | HR 59 | Temp 97.6°F | Resp 18 | Ht 69.0 in | Wt 206.0 lb

## 2021-06-28 DIAGNOSIS — C679 Malignant neoplasm of bladder, unspecified: Secondary | ICD-10-CM | POA: Diagnosis not present

## 2021-06-28 DIAGNOSIS — Z95828 Presence of other vascular implants and grafts: Secondary | ICD-10-CM

## 2021-06-28 DIAGNOSIS — C680 Malignant neoplasm of urethra: Secondary | ICD-10-CM | POA: Diagnosis not present

## 2021-06-28 DIAGNOSIS — E032 Hypothyroidism due to medicaments and other exogenous substances: Secondary | ICD-10-CM

## 2021-06-28 DIAGNOSIS — Z79899 Other long term (current) drug therapy: Secondary | ICD-10-CM | POA: Diagnosis not present

## 2021-06-28 DIAGNOSIS — Z5112 Encounter for antineoplastic immunotherapy: Secondary | ICD-10-CM | POA: Diagnosis not present

## 2021-06-28 DIAGNOSIS — G629 Polyneuropathy, unspecified: Secondary | ICD-10-CM | POA: Diagnosis not present

## 2021-06-28 DIAGNOSIS — C609 Malignant neoplasm of penis, unspecified: Secondary | ICD-10-CM | POA: Diagnosis not present

## 2021-06-28 DIAGNOSIS — E876 Hypokalemia: Secondary | ICD-10-CM | POA: Diagnosis not present

## 2021-06-28 LAB — CMP (CANCER CENTER ONLY)
ALT: 13 U/L (ref 0–44)
AST: 16 U/L (ref 15–41)
Albumin: 4.2 g/dL (ref 3.5–5.0)
Alkaline Phosphatase: 83 U/L (ref 38–126)
Anion gap: 8 (ref 5–15)
BUN: 20 mg/dL (ref 8–23)
CO2: 30 mmol/L (ref 22–32)
Calcium: 10 mg/dL (ref 8.9–10.3)
Chloride: 104 mmol/L (ref 98–111)
Creatinine: 1.2 mg/dL (ref 0.61–1.24)
GFR, Estimated: >60 mL/min (ref >60)
Glucose, Bld: 120 mg/dL — ABNORMAL HIGH (ref 70–99)
Potassium: 3.3 mmol/L — ABNORMAL LOW (ref 3.5–5.1)
Sodium: 142 mmol/L (ref 135–145)
Total Bilirubin: 0.5 mg/dL (ref 0.3–1.2)
Total Protein: 7.2 g/dL (ref 6.5–8.1)

## 2021-06-28 LAB — CBC WITH DIFFERENTIAL (CANCER CENTER ONLY)
Abs Immature Granulocytes: 0.01 10*3/uL (ref 0.00–0.07)
Basophils Absolute: 0 10*3/uL (ref 0.0–0.1)
Basophils Relative: 0 %
Eosinophils Absolute: 0.3 10*3/uL (ref 0.0–0.5)
Eosinophils Relative: 6 %
HCT: 39.6 % (ref 39.0–52.0)
Hemoglobin: 13.4 g/dL (ref 13.0–17.0)
Immature Granulocytes: 0 %
Lymphocytes Relative: 31 %
Lymphs Abs: 1.6 10*3/uL (ref 0.7–4.0)
MCH: 29.3 pg (ref 26.0–34.0)
MCHC: 33.8 g/dL (ref 30.0–36.0)
MCV: 86.5 fL (ref 80.0–100.0)
Monocytes Absolute: 0.5 10*3/uL (ref 0.1–1.0)
Monocytes Relative: 9 %
Neutro Abs: 2.7 10*3/uL (ref 1.7–7.7)
Neutrophils Relative %: 54 %
Platelet Count: 207 10*3/uL (ref 150–400)
RBC: 4.58 MIL/uL (ref 4.22–5.81)
RDW: 13.9 % (ref 11.5–15.5)
WBC Count: 5.1 10*3/uL (ref 4.0–10.5)
nRBC: 0 % (ref 0.0–0.2)

## 2021-06-28 LAB — TSH: TSH: 1.409 u[IU]/mL (ref 0.320–4.118)

## 2021-06-28 MED ORDER — HEPARIN SOD (PORK) LOCK FLUSH 100 UNIT/ML IV SOLN
500.0000 [IU] | Freq: Once | INTRAVENOUS | Status: AC | PRN
  Administered 2021-06-28: 500 [IU]

## 2021-06-28 MED ORDER — SODIUM CHLORIDE 0.9 % IV SOLN
1200.0000 mg | Freq: Once | INTRAVENOUS | Status: AC
  Administered 2021-06-28: 1200 mg via INTRAVENOUS
  Filled 2021-06-28: qty 20

## 2021-06-28 MED ORDER — SODIUM CHLORIDE 0.9% FLUSH
10.0000 mL | INTRAVENOUS | Status: DC | PRN
  Administered 2021-06-28: 10 mL

## 2021-06-28 MED ORDER — SODIUM CHLORIDE 0.9 % IV SOLN: Freq: Once | INTRAVENOUS | Status: AC

## 2021-06-28 MED ORDER — SODIUM CHLORIDE 0.9% FLUSH
10.0000 mL | Freq: Once | INTRAVENOUS | Status: AC
  Administered 2021-06-28: 10 mL

## 2021-06-28 NOTE — Patient Instructions (Signed)
[facility]  Discharge Instructions: ?Thank you for choosing [facility] to provide your oncology and hematology care.  ? ?If you have a lab appointment with the Cancer Center, please go directly to the Cancer Center and check in at the registration area. ?  ?Wear comfortable clothing and clothing appropriate for easy access to any Portacath or PICC line.  ? ?We strive to give you quality time with your provider. You may need to reschedule your appointment if you arrive late (15 or more minutes).  Arriving late affects you and other patients whose appointments are after yours.  Also, if you miss three or more appointments without notifying the office, you may be dismissed from the clinic at the provider?s discretion.    ?  ?For prescription refill requests, have your pharmacy contact our office and allow 72 hours for refills to be completed.   ? ?Today you received the following chemotherapy and/or immunotherapy agents: Atezolizumab (Tecentriq)   ?  ?To help prevent nausea and vomiting after your treatment, we encourage you to take your nausea medication as directed. ? ?BELOW ARE SYMPTOMS THAT SHOULD BE REPORTED IMMEDIATELY: ?*FEVER GREATER THAN 100.4 F (38 ?C) OR HIGHER ?*CHILLS OR SWEATING ?*NAUSEA AND VOMITING THAT IS NOT CONTROLLED WITH YOUR NAUSEA MEDICATION ?*UNUSUAL SHORTNESS OF BREATH ?*UNUSUAL BRUISING OR BLEEDING ?*URINARY PROBLEMS (pain or burning when urinating, or frequent urination) ?*BOWEL PROBLEMS (unusual diarrhea, constipation, pain near the anus) ?TENDERNESS IN MOUTH AND THROAT WITH OR WITHOUT PRESENCE OF ULCERS (sore throat, sores in mouth, or a toothache) ?UNUSUAL RASH, SWELLING OR PAIN  ?UNUSUAL VAGINAL DISCHARGE OR ITCHING  ? ?Items with * indicate a potential emergency and should be followed up as soon as possible or go to the Emergency Department if any problems should occur. ? ?Please show the CHEMOTHERAPY ALERT CARD or IMMUNOTHERAPY ALERT CARD  at check-in to the Emergency Department and triage nurse. ? ?Should you have questions after your visit or need to cancel or reschedule your appointment, please contact [facility]  Dept: [phone]  and follow the prompts.  Office hours are 8:00 a.m. to 4:30 p.m. Monday - Friday. Please note that voicemails left after 4:00 p.m. may not be returned until the following business day.  We are closed weekends and major holidays. You have access to a nurse at all times for urgent questions. Please call the main number to the clinic Dept: [phone] and follow the prompts. ? ? ?For any non-urgent questions, you may also contact your provider using MyChart. We now offer e-Visits for anyone 18 and older to request care online for non-urgent symptoms. For details visit [email or web address]. ?  ?Also download the MyChart app! Go to the app store, search "MyChart", open the app, select [facility], and log in with your MyChart username and password. ? ?Due to Covid, a mask is required upon entering the hospital/clinic. If you do not have a mask, one will be given to you upon arrival. For doctor visits, patients may have 1 support person aged 18 or older with them. For treatment visits, patients cannot have anyone with them due to current Covid guidelines and our immunocompromised population.  ? ?

## 2021-06-28 NOTE — Progress Notes (Signed)
Hematology and Oncology Follow Up Visit ? ?Mario Hubbard ?[id number] ?[birth year] 71 y.o. ?06/28/2021 8:00 AM ?[name], MDJones, [name], MD  ? 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  He presented with localized disease in 2015 and subsequently developed pelvic adenopathy. ? ? ?Prior Therapy: ?He underwent surgical resection followed by radiation therapy in 2015. ? ?He subsequently developed metastatic disease with right-sided pelvic lymph node and confirmed the presence of relapsed disease that is PET avid in 2017.  ? ?He was treated with carboplatin and Taxol initially with excellent response after 3 cycles. 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  ? ?He did show worsening disease in October 2018 and subsequently was started on Tecentriq. He relocated to [place] and received Tecentriq maintenance therapy with excellent response. CT scan in August 2021 continues to show no evidence of disease. ? ?Current therapy: Tecentriq 1200 mg every 3 weeks started in 2018 while living in [place].  He is receiving it locally started on February 09, 2020.  He returns for the next cycle of therapy. ? ? ?Interim History: Mario Hubbard is here for a follow-up visit.  Since the last visit, he reports feeling well without any major complaints.  He denies any nausea, vomiting or abdominal pain.  He denies any hospitalizations or illnesses his performance status and quality of life remains unchanged.  He denies any complications related to treatment at this time. ? ? ?Medications: Reviewed without changes. ?Current Outpatient Medications  ?Medication Sig Dispense Refill  ? amLODipine (NORVASC) 10 MG tablet Take 1 tablet (10 mg total) by mouth daily. 90 tablet 0  ? atorvastatin (LIPITOR) 10 MG tablet Take 1 tablet (10 mg total) by mouth daily. 90 tablet 1  ? indapamide (LOZOL) 2.5 MG tablet Take 1  tablet (2.5 mg total) by mouth daily. 90 tablet 0  ? KLOR-CON M20 20 MEQ tablet TAKE 1 TABLET BY MOUTH EVERY DAY 30 tablet 1  ? losartan (COZAAR) 100 MG tablet TAKE 1 TABLET BY MOUTH EVERY DAY 90 tablet 0  ? metoprolol tartrate (LOPRESSOR) 50 MG tablet Take 1 tablet (50 mg total) by mouth 2 (two) times daily. 180 tablet 0  ? sildenafil (VIAGRA) 50 MG tablet Take 50 mg by mouth daily as needed for erectile dysfunction.    ? ?No current facility-administered medications for this visit.  ? ?Facility-Administered Medications Ordered in Other Visits  ?Medication Dose Route Frequency Provider Last Rate Last Admin  ? sodium chloride flush (NS) 0.9 % injection 10 mL  10 mL Intracatheter Once Wyatt Portela, MD      ? ? ? ?Allergies: No Known Allergies ? ? ? ?Physical Exam: ? ? ? ?Blood pressure 138/61, pulse (!) 59, temperature 97.6 ?F (36.4 ?C), temperature source Axillary, resp. rate 18, height '5\' 9"'$  (1.753 m), weight 206 lb (93.4 kg), SpO2 100 %. ? ? ? ?ECOG: 0 ? ? ?General appearance: Comfortable appearing without any discomfort ?Head: Normocephalic without any trauma ?Oropharynx: Mucous membranes are moist and pink without any thrush or ulcers. ?Eyes: Pupils are equal and round reactive to light. ?Lymph nodes: No cervical, supraclavicular, inguinal or axillary lymphadenopathy.   ?Heart:regular rate and rhythm.  S1 and S2 without leg edema. ?Lung: Clear without any rhonchi or wheezes.  No dullness to percussion. ?Abdomin: Soft, nontender, nondistended with good bowel sounds.  No hepatosplenomegaly. ?Musculoskeletal: No joint deformity or effusion.  Full range of  motion noted. ?Neurological: No deficits noted on motor, sensory and deep tendon reflex exam. ?Skin: No petechial rash or dryness.  Appeared moist.  ? ? ? ? ? ? ? ? ? ? ? ? ? ? ? ? ? ? ? ? ? ? ? ? ?Lab Results: ?Lab Results  ?Component Value Date  ? WBC 6.6 06/08/2021  ? HGB 13.4 06/08/2021  ? HCT 40.7 06/08/2021  ? MCV 86.4 06/08/2021  ? PLT 209 06/08/2021   ? ?  Chemistry   ?   ?Component Value Date/Time  ? NA 140 06/08/2021 0758  ? K 3.1 (L) 06/08/2021 0758  ? CL 103 06/08/2021 0758  ? CO2 29 06/08/2021 0758  ? BUN 24 (H) 06/08/2021 0758  ? CREATININE 1.20 06/08/2021 0758  ?    ?Component Value Date/Time  ? CALCIUM 10.1 06/08/2021 0758  ? ALKPHOS 85 06/08/2021 0758  ? AST 13 (L) 06/08/2021 0758  ? ALT 10 06/08/2021 0758  ? BILITOT 0.4 06/08/2021 0758  ?  ? ? ? ? ?Impression and Plan: ? ?71 year old with: ?  ?1.  Ureteral cancer diagnosed in 2015.  He developed stage IV high-grade urothelial carcinoma with pelvic adenopathy diagnosed in 2017. ? ?The natural course of his disease was reviewed at this time and treatment choices were reiterated.  He continues to tolerate Tecentriq without any recent complaints.  Risks and benefits of continuing this treatment were discussed at this time.  Complications that include immune mediated issues and GI toxicities were reviewed.  Alternative treatment options including active surveillance could also be considered.  He is agreeable to proceed at this time. ?  ?2. IV access: Port-A-Cath continues to be in use without any issues. ?  ?3. Antiemetics: No nausea or vomiting reported at this time.  Compazine is available to him. ? ? ?4.  Immune mediated complications.  These complications including pneumonitis, colitis and thyroid disease were reiterated. ? ?5.  Prognosis and goals of care: His disease is incurable although aggressive measures are warranted given his excellent response to therapy. ? ? ?6.  Hypokalemia: He is currently on potassium replacement therapy and potassium level will be checked periodically. ? ?7. Follow-up: In 3 weeks for the next cycle of treatment. ? ? ?30  minutes were dedicated to this encounter.  The time was spent on updating disease status, treatment choices and future plan of care review. ? ?Zola Button, MD ?3/22/20238:00 AM ? ?

## 2021-07-13 ENCOUNTER — Encounter: Payer: Self-pay | Admitting: Oncology

## 2021-07-19 ENCOUNTER — Inpatient Hospital Stay: Payer: Medicare Other | Admitting: Oncology

## 2021-07-19 ENCOUNTER — Inpatient Hospital Stay: Payer: Medicare Other

## 2021-07-19 ENCOUNTER — Inpatient Hospital Stay: Payer: Medicare Other | Attending: Oncology

## 2021-07-19 ENCOUNTER — Other Ambulatory Visit: Payer: Self-pay

## 2021-07-19 VITALS — BP 119/56 | HR 65 | Temp 98.0°F | Resp 16 | Ht 69.0 in | Wt 206.7 lb

## 2021-07-19 DIAGNOSIS — C679 Malignant neoplasm of bladder, unspecified: Secondary | ICD-10-CM | POA: Diagnosis not present

## 2021-07-19 DIAGNOSIS — C680 Malignant neoplasm of urethra: Secondary | ICD-10-CM | POA: Diagnosis not present

## 2021-07-19 DIAGNOSIS — C609 Malignant neoplasm of penis, unspecified: Secondary | ICD-10-CM

## 2021-07-19 DIAGNOSIS — Z95828 Presence of other vascular implants and grafts: Secondary | ICD-10-CM

## 2021-07-19 DIAGNOSIS — Z5112 Encounter for antineoplastic immunotherapy: Secondary | ICD-10-CM | POA: Insufficient documentation

## 2021-07-19 DIAGNOSIS — E032 Hypothyroidism due to medicaments and other exogenous substances: Secondary | ICD-10-CM

## 2021-07-19 DIAGNOSIS — E876 Hypokalemia: Secondary | ICD-10-CM | POA: Diagnosis not present

## 2021-07-19 DIAGNOSIS — Z79899 Other long term (current) drug therapy: Secondary | ICD-10-CM | POA: Diagnosis not present

## 2021-07-19 LAB — CMP (CANCER CENTER ONLY)
ALT: 14 U/L (ref 0–44)
AST: 19 U/L (ref 15–41)
Albumin: 4 g/dL (ref 3.5–5.0)
Alkaline Phosphatase: 81 U/L (ref 38–126)
Anion gap: 7 (ref 5–15)
BUN: 23 mg/dL (ref 8–23)
CO2: 31 mmol/L (ref 22–32)
Calcium: 9.6 mg/dL (ref 8.9–10.3)
Chloride: 101 mmol/L (ref 98–111)
Creatinine: 1.19 mg/dL (ref 0.61–1.24)
GFR, Estimated: >60 mL/min (ref >60)
Glucose, Bld: 144 mg/dL — ABNORMAL HIGH (ref 70–99)
Potassium: 2.9 mmol/L — ABNORMAL LOW (ref 3.5–5.1)
Sodium: 139 mmol/L (ref 135–145)
Total Bilirubin: 0.5 mg/dL (ref 0.3–1.2)
Total Protein: 7.2 g/dL (ref 6.5–8.1)

## 2021-07-19 LAB — CBC WITH DIFFERENTIAL (CANCER CENTER ONLY)
Abs Immature Granulocytes: 0.02 10*3/uL (ref 0.00–0.07)
Basophils Absolute: 0 10*3/uL (ref 0.0–0.1)
Basophils Relative: 1 %
Eosinophils Absolute: 0.3 10*3/uL (ref 0.0–0.5)
Eosinophils Relative: 6 %
HCT: 37.8 % — ABNORMAL LOW (ref 39.0–52.0)
Hemoglobin: 12.7 g/dL — ABNORMAL LOW (ref 13.0–17.0)
Immature Granulocytes: 0 %
Lymphocytes Relative: 26 %
Lymphs Abs: 1.5 10*3/uL (ref 0.7–4.0)
MCH: 28.9 pg (ref 26.0–34.0)
MCHC: 33.6 g/dL (ref 30.0–36.0)
MCV: 85.9 fL (ref 80.0–100.0)
Monocytes Absolute: 0.5 10*3/uL (ref 0.1–1.0)
Monocytes Relative: 9 %
Neutro Abs: 3.5 10*3/uL (ref 1.7–7.7)
Neutrophils Relative %: 58 %
Platelet Count: 191 10*3/uL (ref 150–400)
RBC: 4.4 MIL/uL (ref 4.22–5.81)
RDW: 13.6 % (ref 11.5–15.5)
WBC Count: 5.9 10*3/uL (ref 4.0–10.5)
nRBC: 0 % (ref 0.0–0.2)

## 2021-07-19 LAB — TSH: TSH: 1.124 u[IU]/mL (ref 0.320–4.118)

## 2021-07-19 MED ORDER — SODIUM CHLORIDE 0.9 % IV SOLN
1200.0000 mg | Freq: Once | INTRAVENOUS | Status: AC
  Administered 2021-07-19: 1200 mg via INTRAVENOUS
  Filled 2021-07-19: qty 20

## 2021-07-19 MED ORDER — SODIUM CHLORIDE 0.9% FLUSH
10.0000 mL | INTRAVENOUS | Status: DC | PRN
  Administered 2021-07-19: 10 mL

## 2021-07-19 MED ORDER — SODIUM CHLORIDE 0.9 % IV SOLN: Freq: Once | INTRAVENOUS | Status: AC

## 2021-07-19 MED ORDER — SODIUM CHLORIDE 0.9% FLUSH
10.0000 mL | Freq: Once | INTRAVENOUS | Status: AC
  Administered 2021-07-19: 10 mL

## 2021-07-19 MED ORDER — HEPARIN SOD (PORK) LOCK FLUSH 100 UNIT/ML IV SOLN
500.0000 [IU] | Freq: Once | INTRAVENOUS | Status: AC | PRN
  Administered 2021-07-19: 500 [IU]

## 2021-07-19 NOTE — Patient Instructions (Signed)
[facility]  Discharge Instructions: ?Thank you for choosing [facility] to provide your oncology and hematology care.  ? ?If you have a lab appointment with the Cancer Center, please go directly to the Cancer Center and check in at the registration area. ?  ?Wear comfortable clothing and clothing appropriate for easy access to any Portacath or PICC line.  ? ?We strive to give you quality time with your provider. You may need to reschedule your appointment if you arrive late (15 or more minutes).  Arriving late affects you and other patients whose appointments are after yours.  Also, if you miss three or more appointments without notifying the office, you may be dismissed from the clinic at the provider?s discretion.    ?  ?For prescription refill requests, have your pharmacy contact our office and allow 72 hours for refills to be completed.   ? ?Today you received the following chemotherapy and/or immunotherapy agents: Atezolizumab (Tecentriq)   ?  ?To help prevent nausea and vomiting after your treatment, we encourage you to take your nausea medication as directed. ? ?BELOW ARE SYMPTOMS THAT SHOULD BE REPORTED IMMEDIATELY: ?*FEVER GREATER THAN 100.4 F (38 ?C) OR HIGHER ?*CHILLS OR SWEATING ?*NAUSEA AND VOMITING THAT IS NOT CONTROLLED WITH YOUR NAUSEA MEDICATION ?*UNUSUAL SHORTNESS OF BREATH ?*UNUSUAL BRUISING OR BLEEDING ?*URINARY PROBLEMS (pain or burning when urinating, or frequent urination) ?*BOWEL PROBLEMS (unusual diarrhea, constipation, pain near the anus) ?TENDERNESS IN MOUTH AND THROAT WITH OR WITHOUT PRESENCE OF ULCERS (sore throat, sores in mouth, or a toothache) ?UNUSUAL RASH, SWELLING OR PAIN  ?UNUSUAL VAGINAL DISCHARGE OR ITCHING  ? ?Items with * indicate a potential emergency and should be followed up as soon as possible or go to the Emergency Department if any problems should occur. ? ?Please show the CHEMOTHERAPY ALERT CARD or IMMUNOTHERAPY ALERT CARD  at check-in to the Emergency Department and triage nurse. ? ?Should you have questions after your visit or need to cancel or reschedule your appointment, please contact [facility]  Dept: [phone]  and follow the prompts.  Office hours are 8:00 a.m. to 4:30 p.m. Monday - Friday. Please note that voicemails left after 4:00 p.m. may not be returned until the following business day.  We are closed weekends and major holidays. You have access to a nurse at all times for urgent questions. Please call the main number to the clinic Dept: [phone] and follow the prompts. ? ? ?For any non-urgent questions, you may also contact your provider using MyChart. We now offer e-Visits for anyone 18 and older to request care online for non-urgent symptoms. For details visit [email or web address]. ?  ?Also download the MyChart app! Go to the app store, search "MyChart", open the app, select [facility], and log in with your MyChart username and password. ? ?Due to Covid, a mask is required upon entering the hospital/clinic. If you do not have a mask, one will be given to you upon arrival. For doctor visits, patients may have 1 support person aged 18 or older with them. For treatment visits, patients cannot have anyone with them due to current Covid guidelines and our immunocompromised population.  ? ?

## 2021-07-19 NOTE — Progress Notes (Signed)
Hematology and Oncology Follow Up Visit ? ?Mario Hubbard ?[id number] ?[birth year] 71 y.o. ?07/19/2021 7:58 AM ?Mario Hubbard, MDJones, [name], MD  ? ?Principle Diagnosis: 71 year old man with ureteral cancer in 2015.  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   ? ? ?Prior Therapy: ?He underwent surgical resection followed by radiation therapy in 2015. ? ?He subsequently developed metastatic disease with right-sided pelvic lymph node and confirmed the presence of relapsed disease that is PET avid in 2017.  ? ?He was treated with carboplatin and Taxol initially with excellent response after 3 cycles. 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  ? ?He did show worsening disease in October 2018 and subsequently was started on Tecentriq. He relocated to [place] and received Tecentriq maintenance therapy with excellent response. CT scan in August 2021 continues to show no evidence of disease. ? ?Current therapy: Tecentriq 1200 mg every 3 weeks started in 2018 while living in [place].  He is receiving it locally started on February 09, 2020.  He presents for subsequent cycle of treatment. ? ?Interim History: [name] returns today for repeat evaluation.  Since the last visit, he reports no major changes in his health.  He continues to tolerate therapy without any major complications.  He denies nausea, fatigue or skin rash.  He denies any dyspnea exertion, cough or wheezing.  He denies any diarrhea or changes in his bowels.  His performance status and quality of life remains unchanged. ? ? ?Medications: Reviewed without changes. ?Current Outpatient Medications  ?Medication Sig Dispense Refill  ? amLODipine (NORVASC) 10 MG tablet Take 1 tablet (10 mg total) by mouth daily. 90 tablet 0  ? atorvastatin (LIPITOR) 10 MG tablet Take 1 tablet (10 mg total) by mouth daily. 90 tablet 1  ? indapamide (LOZOL) 2.5 MG tablet Take 1  tablet (2.5 mg total) by mouth daily. 90 tablet 0  ? KLOR-CON M20 20 MEQ tablet TAKE 1 TABLET BY MOUTH EVERY DAY 30 tablet 1  ? losartan (COZAAR) 100 MG tablet TAKE 1 TABLET BY MOUTH EVERY DAY 90 tablet 0  ? metoprolol tartrate (LOPRESSOR) 50 MG tablet Take 1 tablet (50 mg total) by mouth 2 (two) times daily. 180 tablet 0  ? sildenafil (VIAGRA) 50 MG tablet Take 50 mg by mouth daily as needed for erectile dysfunction.    ? ?No current facility-administered medications for this visit.  ? ? ? ?Allergies: No Known Allergies ? ? ? ?Physical Exam: ? ? ? ?Blood pressure (!) 119/56, pulse 65, temperature 98 ?F (36.7 ?C), temperature source Temporal, resp. rate 16, height '5\' 9"'$  (1.753 m), weight 206 lb 11.2 oz (93.8 kg), SpO2 100 %. ? ? ? ? ?ECOG: 0 ? ? ?General appearance: Alert, awake without any distress. ?Head: Atraumatic without abnormalities ?Oropharynx: Without any thrush or ulcers. ?Eyes: No scleral icterus. ?Lymph nodes: No lymphadenopathy noted in the cervical, supraclavicular, or axillary nodes ?Heart:regular rate and rhythm, without any murmurs or gallops.   ?Lung: Clear to auscultation without any rhonchi, wheezes or dullness to percussion. ?Abdomin: Soft, nontender without any shifting dullness or ascites. ?Musculoskeletal: No clubbing or cyanosis. ?Neurological: No motor or sensory deficits. ?Skin: No rashes or lesions. ? ? ? ? ? ? ? ? ? ? ? ? ? ? ? ? ? ? ? ? ? ? ? ? ?Lab Results: ?Lab Results  ?Component Value Date  ? WBC 5.1 06/28/2021  ? HGB 13.4 06/28/2021  ?  HCT 39.6 06/28/2021  ? MCV 86.5 06/28/2021  ? PLT 207 06/28/2021  ? ?  Chemistry   ?   ?Component Value Date/Time  ? NA 142 06/28/2021 0802  ? K 3.3 (L) 06/28/2021 0802  ? CL 104 06/28/2021 0802  ? CO2 30 06/28/2021 0802  ? BUN 20 06/28/2021 0802  ? CREATININE 1.20 06/28/2021 0802  ?    ?Component Value Date/Time  ? CALCIUM 10.0 06/28/2021 0802  ? ALKPHOS 83 06/28/2021 0802  ? AST 16 06/28/2021 0802  ? ALT 13 06/28/2021 0802  ? BILITOT 0.5 06/28/2021  0802  ?  ? ? ? ? ?Impression and Plan: ? ?71 year old with: ?  ?1.  Stage IV high-grade urothelial carcinoma with pelvic adenopathy diagnosed in 2017. ? ?He continues to tolerate Tecentriq without any major complications.  Risks and benefits of proceeding with the next cycle of therapy reviewed.  Complications were reiterated including immune mediated issues, fatigue and GI toxicity were discussed.  Plan is to update his staging scans in June 2023 and potentially discontinue therapy afterwards.  He is agreeable to proceed with this plan. ?  ?2. IV access: Port-A-Cath remains in use without complications. ?  ?3. Antiemetics: Compazine remains in use without any issues. ? ?4.  Immune mediated complications.  He has not experienced any issues.  These include pneumonitis, colitis and thyroid disease. ? ?5.  Prognosis and goals of care: Therapy remains palliative although he is experiencing a complete response that is a potentially durable. ? ? ?6.  Hypokalemia: Overall mild and currently on replacement. ? ?7. Follow-up: He will return in 3 weeks for the next cycle of therapy. ? ? ?30  minutes were spent on this visit.  The time was dedicated to reviewing laboratory data, disease status update and outlining future plan of care discussion. ? ?Zola Button, MD ?4/12/20237:58 AM ? ?

## 2021-07-25 ENCOUNTER — Other Ambulatory Visit: Payer: Self-pay | Admitting: Oncology

## 2021-07-26 ENCOUNTER — Encounter: Payer: Self-pay | Admitting: Oncology

## 2021-07-31 ENCOUNTER — Other Ambulatory Visit: Payer: Self-pay | Admitting: Internal Medicine

## 2021-08-09 ENCOUNTER — Telehealth: Payer: Self-pay | Admitting: Oncology

## 2021-08-09 NOTE — Telephone Encounter (Signed)
Called patient regarding upcoming May and June appointments, patient is notified. ?

## 2021-08-10 ENCOUNTER — Other Ambulatory Visit: Payer: Self-pay

## 2021-08-10 ENCOUNTER — Inpatient Hospital Stay: Payer: Medicare Other | Attending: Oncology

## 2021-08-10 ENCOUNTER — Inpatient Hospital Stay: Payer: Medicare Other

## 2021-08-10 ENCOUNTER — Inpatient Hospital Stay (HOSPITAL_BASED_OUTPATIENT_CLINIC_OR_DEPARTMENT_OTHER): Payer: Medicare Other | Admitting: Oncology

## 2021-08-10 VITALS — BP 131/63 | HR 69 | Temp 98.0°F | Resp 18 | Ht 69.0 in | Wt 206.4 lb

## 2021-08-10 DIAGNOSIS — C680 Malignant neoplasm of urethra: Secondary | ICD-10-CM

## 2021-08-10 DIAGNOSIS — C679 Malignant neoplasm of bladder, unspecified: Secondary | ICD-10-CM | POA: Insufficient documentation

## 2021-08-10 DIAGNOSIS — E032 Hypothyroidism due to medicaments and other exogenous substances: Secondary | ICD-10-CM

## 2021-08-10 DIAGNOSIS — Z5112 Encounter for antineoplastic immunotherapy: Secondary | ICD-10-CM | POA: Insufficient documentation

## 2021-08-10 DIAGNOSIS — C609 Malignant neoplasm of penis, unspecified: Secondary | ICD-10-CM

## 2021-08-10 DIAGNOSIS — Z95828 Presence of other vascular implants and grafts: Secondary | ICD-10-CM

## 2021-08-10 DIAGNOSIS — Z79899 Other long term (current) drug therapy: Secondary | ICD-10-CM | POA: Insufficient documentation

## 2021-08-10 LAB — CBC WITH DIFFERENTIAL (CANCER CENTER ONLY)
Abs Immature Granulocytes: 0.01 10*3/uL (ref 0.00–0.07)
Basophils Absolute: 0 10*3/uL (ref 0.0–0.1)
Basophils Relative: 0 %
Eosinophils Absolute: 0.3 10*3/uL (ref 0.0–0.5)
Eosinophils Relative: 6 %
HCT: 40.6 % (ref 39.0–52.0)
Hemoglobin: 13.4 g/dL (ref 13.0–17.0)
Immature Granulocytes: 0 %
Lymphocytes Relative: 31 %
Lymphs Abs: 1.5 10*3/uL (ref 0.7–4.0)
MCH: 28.7 pg (ref 26.0–34.0)
MCHC: 33 g/dL (ref 30.0–36.0)
MCV: 86.9 fL (ref 80.0–100.0)
Monocytes Absolute: 0.4 10*3/uL (ref 0.1–1.0)
Monocytes Relative: 9 %
Neutro Abs: 2.6 10*3/uL (ref 1.7–7.7)
Neutrophils Relative %: 54 %
Platelet Count: 192 10*3/uL (ref 150–400)
RBC: 4.67 MIL/uL (ref 4.22–5.81)
RDW: 13.7 % (ref 11.5–15.5)
WBC Count: 4.9 10*3/uL (ref 4.0–10.5)
nRBC: 0 % (ref 0.0–0.2)

## 2021-08-10 LAB — CMP (CANCER CENTER ONLY)
ALT: 14 U/L (ref 0–44)
AST: 16 U/L (ref 15–41)
Albumin: 4 g/dL (ref 3.5–5.0)
Alkaline Phosphatase: 82 U/L (ref 38–126)
Anion gap: 8 (ref 5–15)
BUN: 18 mg/dL (ref 8–23)
CO2: 29 mmol/L (ref 22–32)
Calcium: 9.4 mg/dL (ref 8.9–10.3)
Chloride: 103 mmol/L (ref 98–111)
Creatinine: 1.15 mg/dL (ref 0.61–1.24)
GFR, Estimated: >60 mL/min (ref >60)
Glucose, Bld: 137 mg/dL — ABNORMAL HIGH (ref 70–99)
Potassium: 2.9 mmol/L — ABNORMAL LOW (ref 3.5–5.1)
Sodium: 140 mmol/L (ref 135–145)
Total Bilirubin: 0.7 mg/dL (ref 0.3–1.2)
Total Protein: 7 g/dL (ref 6.5–8.1)

## 2021-08-10 LAB — TSH: TSH: 1.512 u[IU]/mL (ref 0.350–4.500)

## 2021-08-10 MED ORDER — SODIUM CHLORIDE 0.9% FLUSH
10.0000 mL | Freq: Once | INTRAVENOUS | Status: AC
  Administered 2021-08-10: 10 mL

## 2021-08-10 MED ORDER — HEPARIN SOD (PORK) LOCK FLUSH 100 UNIT/ML IV SOLN
500.0000 [IU] | Freq: Once | INTRAVENOUS | Status: AC | PRN
  Administered 2021-08-10: 500 [IU]

## 2021-08-10 MED ORDER — SODIUM CHLORIDE 0.9% FLUSH
10.0000 mL | INTRAVENOUS | Status: DC | PRN
  Administered 2021-08-10: 10 mL

## 2021-08-10 MED ORDER — SODIUM CHLORIDE 0.9 % IV SOLN: Freq: Once | INTRAVENOUS | Status: AC

## 2021-08-10 MED ORDER — SODIUM CHLORIDE 0.9 % IV SOLN
1200.0000 mg | Freq: Once | INTRAVENOUS | Status: AC
  Administered 2021-08-10: 1200 mg via INTRAVENOUS
  Filled 2021-08-10: qty 20

## 2021-08-10 NOTE — Patient Instructions (Addendum)
[facility]  Discharge Instructions: °Thank you for choosing [facility] to provide your oncology and hematology care.  ° °If you have a lab appointment with the Cancer Center, please go directly to the Cancer Center and check in at the registration area. °  °Wear comfortable clothing and clothing appropriate for easy access to any Portacath or PICC line.  ° °We strive to give you quality time with your provider. You may need to reschedule your appointment if you arrive late (15 or more minutes).  Arriving late affects you and other patients whose appointments are after yours.  Also, if you miss three or more appointments without notifying the office, you may be dismissed from the clinic at the provider’s discretion.    °  °For prescription refill requests, have your pharmacy contact our office and allow 72 hours for refills to be completed.   ° °Today you received the following chemotherapy and/or immunotherapy agents: Tecentriq.     °  °To help prevent nausea and vomiting after your treatment, we encourage you to take your nausea medication as directed. ° °BELOW ARE SYMPTOMS THAT SHOULD BE REPORTED IMMEDIATELY: °*FEVER GREATER THAN 100.4 F (38 °C) OR HIGHER °*CHILLS OR SWEATING °*NAUSEA AND VOMITING THAT IS NOT CONTROLLED WITH YOUR NAUSEA MEDICATION °*UNUSUAL SHORTNESS OF BREATH °*UNUSUAL BRUISING OR BLEEDING °*URINARY PROBLEMS (pain or burning when urinating, or frequent urination) °*BOWEL PROBLEMS (unusual diarrhea, constipation, pain near the anus) °TENDERNESS IN MOUTH AND THROAT WITH OR WITHOUT PRESENCE OF ULCERS (sore throat, sores in mouth, or a toothache) °UNUSUAL RASH, SWELLING OR PAIN  °UNUSUAL VAGINAL DISCHARGE OR ITCHING  ° °Items with * indicate a potential emergency and should be followed up as soon as possible or go to the Emergency Department if any problems should occur. ° °Please show the CHEMOTHERAPY ALERT CARD or IMMUNOTHERAPY ALERT CARD at check-in  to the Emergency Department and triage nurse. ° °Should you have questions after your visit or need to cancel or reschedule your appointment, please contact [facility]  Dept: [phone]  and follow the prompts.  Office hours are 8:00 a.m. to 4:30 p.m. Monday - Friday. Please note that voicemails left after 4:00 p.m. may not be returned until the following business day.  We are closed weekends and major holidays. You have access to a nurse at all times for urgent questions. Please call the main number to the clinic Dept: [phone] and follow the prompts. ° ° °For any non-urgent questions, you may also contact your provider using MyChart. We now offer e-Visits for anyone 18 and older to request care online for non-urgent symptoms. For details visit [email or web address]. °  °Also download the MyChart app! Go to the app store, search "MyChart", open the app, select [facility], and log in with your MyChart username and password. ° °Due to Covid, a mask is required upon entering the hospital/clinic. If you do not have a mask, one will be given to you upon arrival. For doctor visits, patients may have 1 support person aged 18 or older with them. For treatment visits, patients cannot have anyone with them due to current Covid guidelines and our immunocompromised population.  ° °

## 2021-08-10 NOTE — Progress Notes (Signed)
Hematology and Oncology Follow Up Visit ? ?[name] ?[id number] ?March 08, 1951 71 y.o. ?08/10/2021 7:56 AM ?[name], MDJones, [name], MD  ? 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  He initially presented with localized ureteral cancer in 2015. ? ? ?Prior Therapy: ?He underwent surgical resection followed by radiation therapy in 2015. ? ?He subsequently developed metastatic disease with right-sided pelvic lymph node and confirmed the presence of relapsed disease that is PET avid in 2017.  ? ?He was treated with carboplatin and Taxol initially with excellent response after 3 cycles. 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  ? ?He did show worsening disease in October 2018 and subsequently was started on Tecentriq. He relocated to [place] and received Tecentriq maintenance therapy with excellent response. CT scan in August 2021 continues to show no evidence of disease. ? ?Current therapy: Tecentriq 1200 mg every 3 weeks started in 2018 while living in [place].  He is receiving it locally started on February 09, 2020.  He returns for follow-up evaluation and the next cycle of therapy. ? ?Interim History: Mr. Gulbranson is here for a follow-up visit.  Since last visit, he reports feeling well without any major complaints.  He denies any nausea, vomiting or abdominal pain.  He denies any skin rashes or lesions.  He denies any recent hospitalizations or illnesses.  Continues to tolerate current treatment without any complaints. ? ? ?Medications: Updated on review. ?Current Outpatient Medications  ?Medication Sig Dispense Refill  ? amLODipine (NORVASC) 10 MG tablet Take 1 tablet (10 mg total) by mouth daily. 90 tablet 0  ? atorvastatin (LIPITOR) 10 MG tablet Take 1 tablet (10 mg total) by mouth daily. 90 tablet 1  ? indapamide (LOZOL) 2.5 MG tablet Take 1 tablet (2.5 mg total) by mouth  daily. 90 tablet 0  ? KLOR-CON M20 20 MEQ tablet TAKE 1 TABLET BY MOUTH EVERY DAY 30 tablet 1  ? losartan (COZAAR) 100 MG tablet TAKE 1 TABLET BY MOUTH EVERY DAY 90 tablet 0  ? metoprolol tartrate (LOPRESSOR) 50 MG tablet Take 1 tablet (50 mg total) by mouth 2 (two) times daily. 180 tablet 0  ? sildenafil (VIAGRA) 50 MG tablet Take 50 mg by mouth daily as needed for erectile dysfunction.    ? ?No current facility-administered medications for this visit.  ? ? ? ?Allergies: No Known Allergies ? ? ? ?Physical Exam: ? ? ?Blood pressure 131/63, pulse 69, temperature 98 ?F (36.7 ?C), temperature source Axillary, resp. rate 18, height '5\' 9"'$  (1.753 m), weight 206 lb 6.4 oz (93.6 kg), SpO2 99 %. ? ? ? ? ? ? ?ECOG: 0 ? ? ? ?General appearance: Comfortable appearing without any discomfort ?Head: Normocephalic without any trauma ?Oropharynx: Mucous membranes are moist and pink without any thrush or ulcers. ?Eyes: Pupils are equal and round reactive to light. ?Lymph nodes: No cervical, supraclavicular, inguinal or axillary lymphadenopathy.   ?Heart:regular rate and rhythm.  S1 and S2 without leg edema. ?Lung: Clear without any rhonchi or wheezes.  No dullness to percussion. ?Abdomin: Soft, nontender, nondistended with good bowel sounds.  No hepatosplenomegaly. ?Musculoskeletal: No joint deformity or effusion.  Full range of motion noted. ?Neurological: No deficits noted on motor, sensory and deep tendon reflex exam. ?Skin: No petechial rash or dryness.  Appeared moist.  ? ? ? ? ? ? ? ? ? ? ? ? ? ? ? ? ? ? ? ? ? ? ? ? ?  Lab Results: ?Lab Results  ?Component Value Date  ? WBC 5.9 07/19/2021  ? HGB 12.7 (L) 07/19/2021  ? HCT 37.8 (L) 07/19/2021  ? MCV 85.9 07/19/2021  ? PLT 191 07/19/2021  ? ?  Chemistry   ?   ?Component Value Date/Time  ? NA 139 07/19/2021 0816  ? K 2.9 (L) 07/19/2021 0816  ? CL 101 07/19/2021 0816  ? CO2 31 07/19/2021 0816  ? BUN 23 07/19/2021 0816  ? CREATININE 1.19 07/19/2021 0816  ?    ?Component Value  Date/Time  ? CALCIUM 9.6 07/19/2021 0816  ? ALKPHOS 81 07/19/2021 0816  ? AST 19 07/19/2021 0816  ? ALT 14 07/19/2021 0816  ? BILITOT 0.5 07/19/2021 0816  ?  ? ? ? ? ?Impression and Plan: ? ?71 year old with: ?  ?1.  Ureteral cancer diagnosed in 2015.  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 ? ? ?Risks and benefits of proceeding with Tecentriq were reviewed at this time.  Complications that include autoimmune issues as well as GI toxicity among others were reviewed.  The plan is to update his staging scans in June 2023.  Subsequent discontinuation of therapy could be considered.  He is agreeable to proceed at this time. ?  ?2. IV access: Port-A-Cath continues to be in use without any issues. ?  ?3. Antiemetics: No nausea or vomiting reported at this time.  Compazine is available to him.. ? ?4.  Immune mediated complications.  Complications including pneumonitis, colitis and thyroid disease were reiterated.  He is not experiencing any at this time. ? ?5.  Prognosis and goals of care: His disease is incurable although aggressive measures are warranted at this time. ? ? ?6.  Hypokalemia: Related to antihypertensive medication as well as immunotherapy.  I recommended oral replacement therapy with potassium rich diet. ? ?7. Follow-up: In 3 weeks for repeat evaluation. ? ? ?30  minutes were dedicated to this encounter.  The time was spent on reviewing laboratory data, disease status update and outlining future plan of care review. ? ?Zola Button, MD ?5/4/20237:56 AM ? ?

## 2021-08-13 ENCOUNTER — Other Ambulatory Visit: Payer: Self-pay | Admitting: Internal Medicine

## 2021-08-13 DIAGNOSIS — I10 Essential (primary) hypertension: Secondary | ICD-10-CM

## 2021-08-20 ENCOUNTER — Other Ambulatory Visit: Payer: Self-pay | Admitting: Oncology

## 2021-08-21 ENCOUNTER — Encounter: Payer: Self-pay | Admitting: Oncology

## 2021-08-30 ENCOUNTER — Inpatient Hospital Stay (HOSPITAL_BASED_OUTPATIENT_CLINIC_OR_DEPARTMENT_OTHER): Payer: Medicare Other | Admitting: Oncology

## 2021-08-30 ENCOUNTER — Inpatient Hospital Stay: Payer: Medicare Other

## 2021-08-30 ENCOUNTER — Other Ambulatory Visit: Payer: Self-pay

## 2021-08-30 ENCOUNTER — Telehealth: Payer: Self-pay | Admitting: *Deleted

## 2021-08-30 VITALS — BP 124/83 | HR 53 | Temp 97.9°F | Resp 16 | Wt 205.5 lb

## 2021-08-30 DIAGNOSIS — C680 Malignant neoplasm of urethra: Secondary | ICD-10-CM

## 2021-08-30 DIAGNOSIS — C609 Malignant neoplasm of penis, unspecified: Secondary | ICD-10-CM

## 2021-08-30 DIAGNOSIS — Z95828 Presence of other vascular implants and grafts: Secondary | ICD-10-CM

## 2021-08-30 DIAGNOSIS — E032 Hypothyroidism due to medicaments and other exogenous substances: Secondary | ICD-10-CM

## 2021-08-30 DIAGNOSIS — Z5112 Encounter for antineoplastic immunotherapy: Secondary | ICD-10-CM | POA: Diagnosis not present

## 2021-08-30 LAB — CBC WITH DIFFERENTIAL (CANCER CENTER ONLY)
Abs Immature Granulocytes: 0.01 10*3/uL (ref 0.00–0.07)
Basophils Absolute: 0 10*3/uL (ref 0.0–0.1)
Basophils Relative: 1 %
Eosinophils Absolute: 0.4 10*3/uL (ref 0.0–0.5)
Eosinophils Relative: 6 %
HCT: 38.7 % — ABNORMAL LOW (ref 39.0–52.0)
Hemoglobin: 13.2 g/dL (ref 13.0–17.0)
Immature Granulocytes: 0 %
Lymphocytes Relative: 33 %
Lymphs Abs: 1.9 10*3/uL (ref 0.7–4.0)
MCH: 29 pg (ref 26.0–34.0)
MCHC: 34.1 g/dL (ref 30.0–36.0)
MCV: 85.1 fL (ref 80.0–100.0)
Monocytes Absolute: 0.6 10*3/uL (ref 0.1–1.0)
Monocytes Relative: 10 %
Neutro Abs: 2.9 10*3/uL (ref 1.7–7.7)
Neutrophils Relative %: 50 %
Platelet Count: 211 10*3/uL (ref 150–400)
RBC: 4.55 MIL/uL (ref 4.22–5.81)
RDW: 13.3 % (ref 11.5–15.5)
WBC Count: 5.8 10*3/uL (ref 4.0–10.5)
nRBC: 0 % (ref 0.0–0.2)

## 2021-08-30 LAB — CMP (CANCER CENTER ONLY)
ALT: 13 U/L (ref 0–44)
AST: 17 U/L (ref 15–41)
Albumin: 4.1 g/dL (ref 3.5–5.0)
Alkaline Phosphatase: 86 U/L (ref 38–126)
Anion gap: 6 (ref 5–15)
BUN: 19 mg/dL (ref 8–23)
CO2: 31 mmol/L (ref 22–32)
Calcium: 9.6 mg/dL (ref 8.9–10.3)
Chloride: 102 mmol/L (ref 98–111)
Creatinine: 1.16 mg/dL (ref 0.61–1.24)
GFR, Estimated: >60 mL/min (ref >60)
Glucose, Bld: 99 mg/dL (ref 70–99)
Potassium: 3.1 mmol/L — ABNORMAL LOW (ref 3.5–5.1)
Sodium: 139 mmol/L (ref 135–145)
Total Bilirubin: 0.4 mg/dL (ref 0.3–1.2)
Total Protein: 7.2 g/dL (ref 6.5–8.1)

## 2021-08-30 LAB — TSH: TSH: 1.787 u[IU]/mL (ref 0.350–4.500)

## 2021-08-30 MED ORDER — SODIUM CHLORIDE 0.9 % IV SOLN: Freq: Once | INTRAVENOUS | Status: AC

## 2021-08-30 MED ORDER — SODIUM CHLORIDE 0.9% FLUSH
10.0000 mL | INTRAVENOUS | Status: DC | PRN
  Administered 2021-08-30: 10 mL

## 2021-08-30 MED ORDER — SODIUM CHLORIDE 0.9% FLUSH
10.0000 mL | Freq: Once | INTRAVENOUS | Status: AC
  Administered 2021-08-30: 10 mL

## 2021-08-30 MED ORDER — SODIUM CHLORIDE 0.9 % IV SOLN
1200.0000 mg | Freq: Once | INTRAVENOUS | Status: AC
  Administered 2021-08-30: 1200 mg via INTRAVENOUS
  Filled 2021-08-30: qty 20

## 2021-08-30 MED ORDER — HEPARIN SOD (PORK) LOCK FLUSH 100 UNIT/ML IV SOLN
500.0000 [IU] | Freq: Once | INTRAVENOUS | Status: AC | PRN
  Administered 2021-08-30: 500 [IU]

## 2021-08-30 NOTE — Progress Notes (Signed)
Hematology and Oncology Follow Up Visit  Mario Hubbard [id number] 1951/03/06 71 y.o. 08/30/2021 8:02 AM [name], MDJones, [name], MD   Principle Diagnosis: 71 year old man with advanced urothelial carcinoma with pelvic adenopathy noted in 2017.  He has stage IV disease after presenting with localized disease in 2015.   Prior Therapy: He underwent surgical resection followed by radiation therapy in 2015.  He subsequently developed metastatic disease with right-sided pelvic lymph node and confirmed the presence of relapsed disease that is PET avid in 2017.   He was treated with carboplatin and Taxol initially with excellent response after 3 cycles. 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   He did show worsening disease in October 2018 and subsequently was started on Tecentriq. He relocated to [place] and received Tecentriq maintenance therapy with excellent response. CT scan in August 2021 continues to show no evidence of disease.  Current therapy: Tecentriq 1200 mg every 3 weeks started in 2018 while living in [place].  He is receiving it locally started on February 09, 2020.  He is here for the next cycle.  Interim History: Mario Hubbard returns today for follow-up.  Since last visit, he reports feeling well without any major complaints.  He denies any hospitalizations or illnesses.  He denies any complications related to current therapy.  No nausea, vomiting or abdominal pain.  He denies any skin rashes or lesions.  His performance status and quality of life remains unchanged.   Medications: Reviewed without changes. Current Outpatient Medications  Medication Sig Dispense Refill   amLODipine (NORVASC) 10 MG tablet TAKE 1 TABLET BY MOUTH EVERY DAY 90 tablet 0   atorvastatin (LIPITOR) 10 MG tablet Take 1 tablet (10 mg total) by mouth daily. 90 tablet 1   indapamide (LOZOL) 2.5 MG tablet Take 1 tablet (2.5 mg total) by mouth  daily. 90 tablet 0   KLOR-CON M20 20 MEQ tablet TAKE 1 TABLET BY MOUTH EVERY DAY 30 tablet 1   losartan (COZAAR) 100 MG tablet TAKE 1 TABLET BY MOUTH EVERY DAY 90 tablet 0   metoprolol tartrate (LOPRESSOR) 50 MG tablet Take 1 tablet (50 mg total) by mouth 2 (two) times daily. 180 tablet 0   sildenafil (VIAGRA) 50 MG tablet Take 50 mg by mouth daily as needed for erectile dysfunction.     No current facility-administered medications for this visit.     Allergies: No Known Allergies    Physical Exam:     Blood pressure 124/83, pulse (!) 53, temperature 97.9 F (36.6 C), temperature source Tympanic, resp. rate 16, weight 205 lb 8 oz (93.2 kg), SpO2 95 %.      ECOG: 0   General appearance: Alert, awake without any distress. Head: Atraumatic without abnormalities Oropharynx: Without any thrush or ulcers. Eyes: No scleral icterus. Lymph nodes: No lymphadenopathy noted in the cervical, supraclavicular, or axillary nodes Heart:regular rate and rhythm, without any murmurs or gallops.   Lung: Clear to auscultation without any rhonchi, wheezes or dullness to percussion. Abdomin: Soft, nontender without any shifting dullness or ascites. Musculoskeletal: No clubbing or cyanosis. Neurological: No motor or sensory deficits. Skin: No rashes or lesions.                        Lab Results: Lab Results  Component Value Date   WBC 4.9 08/10/2021   HGB 13.4 08/10/2021   HCT 40.6 08/10/2021   MCV 86.9 08/10/2021  PLT 192 08/10/2021     Chemistry      Component Value Date/Time   NA 140 08/10/2021 0805   K 2.9 (L) 08/10/2021 0805   CL 103 08/10/2021 0805   CO2 29 08/10/2021 0805   BUN 18 08/10/2021 0805   CREATININE 1.15 08/10/2021 0805      Component Value Date/Time   CALCIUM 9.4 08/10/2021 0805   ALKPHOS 82 08/10/2021 0805   AST 16 08/10/2021 0805   ALT 14 08/10/2021 0805   BILITOT 0.7 08/10/2021 0805        Impression and Plan:  71 year old  with:   1.  Stage IV high-grade urothelial carcinoma arising from the ureter presented with pelvic adenopathy in 2017.   He is currently on Tecentriq without any major complications and achieved complete response based on imaging studies.  Risks and benefits of proceeding with treatment at this time were reiterated.  He is agreeable to proceed and the plan is to update his staging scan before the next visit.  Therapy discontinuation be considered at that time.  He is agreeable to proceed with this plan.   2. IV access: Port-A-Cath currently in use without any issues.   3. Antiemetics: He is currently on Compazine without any nausea or vomiting.  4.  Immune mediated complications.  I continue to educate him about these complications.  Including pneumonitis, colitis and thyroid disease.  5.  Prognosis and goals of care: Therapy remains palliative at this time although aggressive measures are warranted given his excellent response.   6.  Hypokalemia: Related to antihypertensive medication as well as immunotherapy.  I recommended oral replacement therapy with potassium rich diet.  His potassium continues to be low and will require replacement therapy.  7. Follow-up: In 3 weeks for follow-up evaluation.  30  minutes were spent on this encounter.  The time was dedicated to reviewing laboratory data, disease status update and addressing complications related to cancer and cancer therapy.  Zola Button, MD 5/24/20238:02 AM

## 2021-08-30 NOTE — Telephone Encounter (Signed)
PC to patient, informed him his CT scan has been authorized by his insurance & can be scheduled at any time.  Central Scheduling number given, [phone].  Patient verbalizes understanding.

## 2021-08-30 NOTE — Patient Instructions (Signed)
[facility]  Discharge Instructions: °Thank you for choosing [facility] to provide your oncology and hematology care.  ° °If you have a lab appointment with the Cancer Center, please go directly to the Cancer Center and check in at the registration area. °  °Wear comfortable clothing and clothing appropriate for easy access to any Portacath or PICC line.  ° °We strive to give you quality time with your provider. You may need to reschedule your appointment if you arrive late (15 or more minutes).  Arriving late affects you and other patients whose appointments are after yours.  Also, if you miss three or more appointments without notifying the office, you may be dismissed from the clinic at the provider’s discretion.    °  °For prescription refill requests, have your pharmacy contact our office and allow 72 hours for refills to be completed.   ° °Today you received the following chemotherapy and/or immunotherapy agents: Tecentriq.     °  °To help prevent nausea and vomiting after your treatment, we encourage you to take your nausea medication as directed. ° °BELOW ARE SYMPTOMS THAT SHOULD BE REPORTED IMMEDIATELY: °*FEVER GREATER THAN 100.4 F (38 °C) OR HIGHER °*CHILLS OR SWEATING °*NAUSEA AND VOMITING THAT IS NOT CONTROLLED WITH YOUR NAUSEA MEDICATION °*UNUSUAL SHORTNESS OF BREATH °*UNUSUAL BRUISING OR BLEEDING °*URINARY PROBLEMS (pain or burning when urinating, or frequent urination) °*BOWEL PROBLEMS (unusual diarrhea, constipation, pain near the anus) °TENDERNESS IN MOUTH AND THROAT WITH OR WITHOUT PRESENCE OF ULCERS (sore throat, sores in mouth, or a toothache) °UNUSUAL RASH, SWELLING OR PAIN  °UNUSUAL VAGINAL DISCHARGE OR ITCHING  ° °Items with * indicate a potential emergency and should be followed up as soon as possible or go to the Emergency Department if any problems should occur. ° °Please show the CHEMOTHERAPY ALERT CARD or IMMUNOTHERAPY ALERT CARD at check-in  to the Emergency Department and triage nurse. ° °Should you have questions after your visit or need to cancel or reschedule your appointment, please contact [facility]  Dept: [phone]  and follow the prompts.  Office hours are 8:00 a.m. to 4:30 p.m. Monday - Friday. Please note that voicemails left after 4:00 p.m. may not be returned until the following business day.  We are closed weekends and major holidays. You have access to a nurse at all times for urgent questions. Please call the main number to the clinic Dept: [phone] and follow the prompts. ° ° °For any non-urgent questions, you may also contact your provider using MyChart. We now offer e-Visits for anyone 18 and older to request care online for non-urgent symptoms. For details visit [email or web address]. °  °Also download the MyChart app! Go to the app store, search "MyChart", open the app, select [facility], and log in with your MyChart username and password. ° °Due to Covid, a mask is required upon entering the hospital/clinic. If you do not have a mask, one will be given to you upon arrival. For doctor visits, patients may have 1 support person aged 18 or older with them. For treatment visits, patients cannot have anyone with them due to current Covid guidelines and our immunocompromised population.  ° °

## 2021-09-07 ENCOUNTER — Other Ambulatory Visit: Payer: Self-pay | Admitting: Internal Medicine

## 2021-09-07 DIAGNOSIS — I10 Essential (primary) hypertension: Secondary | ICD-10-CM

## 2021-09-13 ENCOUNTER — Ambulatory Visit (INDEPENDENT_AMBULATORY_CARE_PROVIDER_SITE_OTHER): Payer: Medicare Other | Admitting: Internal Medicine

## 2021-09-13 ENCOUNTER — Encounter: Payer: Self-pay | Admitting: Internal Medicine

## 2021-09-13 ENCOUNTER — Other Ambulatory Visit: Payer: Self-pay | Admitting: Internal Medicine

## 2021-09-13 VITALS — BP 128/70 | HR 67 | Temp 98.0°F | Ht 69.0 in | Wt 204.0 lb

## 2021-09-13 DIAGNOSIS — E559 Vitamin D deficiency, unspecified: Secondary | ICD-10-CM

## 2021-09-13 DIAGNOSIS — N5201 Erectile dysfunction due to arterial insufficiency: Secondary | ICD-10-CM

## 2021-09-13 DIAGNOSIS — N320 Bladder-neck obstruction: Secondary | ICD-10-CM

## 2021-09-13 DIAGNOSIS — I10 Essential (primary) hypertension: Secondary | ICD-10-CM

## 2021-09-13 DIAGNOSIS — E785 Hyperlipidemia, unspecified: Secondary | ICD-10-CM | POA: Diagnosis not present

## 2021-09-13 DIAGNOSIS — R739 Hyperglycemia, unspecified: Secondary | ICD-10-CM | POA: Diagnosis not present

## 2021-09-13 DIAGNOSIS — Z125 Encounter for screening for malignant neoplasm of prostate: Secondary | ICD-10-CM

## 2021-09-13 DIAGNOSIS — E876 Hypokalemia: Secondary | ICD-10-CM

## 2021-09-13 DIAGNOSIS — E538 Deficiency of other specified B group vitamins: Secondary | ICD-10-CM

## 2021-09-13 DIAGNOSIS — T502X5A Adverse effect of carbonic-anhydrase inhibitors, benzothiadiazides and other diuretics, initial encounter: Secondary | ICD-10-CM

## 2021-09-13 LAB — HEMOGLOBIN A1C: Hgb A1c MFr Bld: 5.9 % (ref 4.6–6.5)

## 2021-09-13 LAB — VITAMIN B12: Vitamin B-12: 342 pg/mL (ref 211–911)

## 2021-09-13 LAB — PSA
PSA: 2.6
PSA: 2.68 ng/mL (ref 0.10–4.00)

## 2021-09-13 LAB — VITAMIN D 25 HYDROXY (VIT D DEFICIENCY, FRACTURES): VITD: 57.29 ng/mL (ref 30.00–100.00)

## 2021-09-13 LAB — LIPID PANEL
Cholesterol: 172 mg/dL (ref 0–200)
HDL: 40.2 mg/dL (ref >39.00)
LDL Cholesterol: 112 mg/dL — ABNORMAL HIGH (ref 0–99)
NonHDL: 132.05
Total CHOL/HDL Ratio: 4
Triglycerides: 99 mg/dL (ref 0.0–149.0)
VLDL: 19.8 mg/dL (ref 0.0–40.0)

## 2021-09-13 MED ORDER — LOSARTAN POTASSIUM 100 MG PO TABS: 100.0000 mg | ORAL_TABLET | Freq: Every day | ORAL | 3 refills | Status: DC

## 2021-09-13 MED ORDER — AMLODIPINE BESYLATE 10 MG PO TABS: 10.0000 mg | ORAL_TABLET | Freq: Every day | ORAL | 3 refills | Status: DC

## 2021-09-13 MED ORDER — SILDENAFIL CITRATE 50 MG PO TABS
50.0000 mg | ORAL_TABLET | Freq: Every day | ORAL | 11 refills | Status: DC | PRN
Start: 2021-09-13 — End: 2024-01-15

## 2021-09-13 MED ORDER — ATORVASTATIN CALCIUM 10 MG PO TABS: 10.0000 mg | ORAL_TABLET | Freq: Every day | ORAL | 3 refills | Status: DC

## 2021-09-13 MED ORDER — ATORVASTATIN CALCIUM 20 MG PO TABS
20.0000 mg | ORAL_TABLET | Freq: Every day | ORAL | 3 refills | Status: DC
Start: 2021-09-13 — End: 2022-09-04

## 2021-09-13 MED ORDER — INDAPAMIDE 2.5 MG PO TABS: 2.5000 mg | ORAL_TABLET | Freq: Every day | ORAL | 3 refills | Status: DC

## 2021-09-13 MED ORDER — METOPROLOL TARTRATE 50 MG PO TABS
50.0000 mg | ORAL_TABLET | Freq: Two times a day (BID) | ORAL | 3 refills | Status: DC
Start: 2021-09-13 — End: 2022-03-15

## 2021-09-13 NOTE — Assessment & Plan Note (Signed)
Chronic mild low stable, pt declines further tx  Lab Results  Component Value Date   K 3.1 (L) 08/30/2021

## 2021-09-13 NOTE — Progress Notes (Signed)
Patient ID: Mario Hubbard, male   DOB: 1950/11/15, 71 y.o.   MRN: [id number]       Chief Complaint: follow up HTN, HLD, ED and low K       HPI:  Mario Hubbard is a 71 y.o. male here overall doing ok, running out of meds, PCP not available today, Pt denies chest pain, increased sob or doe, wheezing, orthopnea, PND, increased LE swelling, palpitations, dizziness or syncope.   Pt denies polydipsia, polyuria, or new focal neuro s/s.    Pt denies fever, wt loss, night sweats, loss of appetite, or other constitutional symptoms  Trying to follow appropriate diet.  Good med compliance.  No other new complaints except increased ED symtpoms in last year.       Wt Readings from Last 3 Encounters:  09/13/21 204 lb (92.5 kg)  08/30/21 205 lb 8 oz (93.2 kg)  08/10/21 206 lb 6.4 oz (93.6 kg)   BP Readings from Last 3 Encounters:  09/13/21 128/70  08/30/21 124/83  08/10/21 131/63         Past Medical History:  Diagnosis Date   Arthritis    Hypertension    History reviewed. No pertinent surgical history.  reports that he has quit smoking. He has never used smokeless tobacco. He reports that he does not drink alcohol and does not use drugs. family history includes Diabetes in his brother; Healthy in his sister; Hypertension in his mother; Kidney disease in his mother. No Known Allergies No current outpatient medications on file prior to visit.   No current facility-administered medications on file prior to visit.        ROS:  All others reviewed and negative.  Objective        PE:  BP 128/70 (BP Location: Left Arm, Patient Position: Sitting, Cuff Size: Large)   Pulse 67   Temp 98 F (36.7 C) (Oral)   Ht '5\' 9"'$  (1.753 m)   Wt 204 lb (92.5 kg)   SpO2 98%   BMI 30.13 kg/m                 Constitutional: Pt appears in NAD               HENT: Head: NCAT.                Right Ear: External ear normal.                 Left Ear: External ear normal.                Eyes: . Pupils are equal, round, and  reactive to light. Conjunctivae and EOM are normal               Nose: without d/c or deformity               Neck: Neck supple. Gross normal ROM               Cardiovascular: Normal rate and regular rhythm.                 Pulmonary/Chest: Effort normal and breath sounds without rales or wheezing.                Abd:  Soft, NT, ND, + BS, no organomegaly               Neurological: Pt is alert. At baseline orientation, motor grossly intact  Skin: Skin is warm. No rashes, no other new lesions, LE edema - none               Psychiatric: Pt behavior is normal without agitation   Micro: none  Cardiac tracings I have personally interpreted today:  none  Pertinent Radiological findings (summarize): none   Lab Results  Component Value Date   WBC 5.8 08/30/2021   HGB 13.2 08/30/2021   HCT 38.7 (L) 08/30/2021   PLT 211 08/30/2021   GLUCOSE 99 08/30/2021   CHOL 172 09/13/2021   TRIG 99.0 09/13/2021   HDL 40.20 09/13/2021   LDLCALC 112 (H) 09/13/2021   ALT 13 08/30/2021   AST 17 08/30/2021   NA 139 08/30/2021   K 3.1 (L) 08/30/2021   CL 102 08/30/2021   CREATININE 1.16 08/30/2021   BUN 19 08/30/2021   CO2 31 08/30/2021   TSH 1.787 08/30/2021   PSA 2.68 09/13/2021   HGBA1C 5.9 09/13/2021   Assessment/Plan:  Mario Hubbard is a 71 y.o. Black or African American [2] male with  has a past medical history of Arthritis and Hypertension.  Diuretic-induced hypokalemia Chronic mild low stable, pt declines further tx  Lab Results  Component Value Date   K 3.1 (L) 08/30/2021    Primary hypertension BP Readings from Last 3 Encounters:  09/13/21 128/70  08/30/21 124/83  08/10/21 131/63   Stable, pt to continue medical treatment novasc, lozol, losartan, lopressor as below:   Current Outpatient Medications (Cardiovascular):    amLODipine (NORVASC) 10 MG tablet, Take 1 tablet (10 mg total) by mouth daily.   atorvastatin (LIPITOR) 20 MG tablet, Take 1 tablet (20 mg total)  by mouth daily.   indapamide (LOZOL) 2.5 MG tablet, Take 1 tablet (2.5 mg total) by mouth daily.   losartan (COZAAR) 100 MG tablet, Take 1 tablet (100 mg total) by mouth daily.   metoprolol tartrate (LOPRESSOR) 50 MG tablet, Take 1 tablet (50 mg total) by mouth 2 (two) times daily.   sildenafil (VIAGRA) 50 MG tablet, Take 1 tablet (50 mg total) by mouth daily as needed for erectile dysfunction.     Current Outpatient Medications (Other):    KLOR-CON M20 20 MEQ tablet, TAKE 1 TABLET BY MOUTH EVERY DAY   Hyperlipidemia LDL goal <100 Lab Results  Component Value Date   LDLCALC 112 (H) 09/13/2021   Uncontrolled, goal ldl < 100, pt to incresae current statin to lipitor 20 qd  Erectile dysfunction due to arterial insufficiency Recent worsening, for viagra 100 qd prn  Followup: Return in about 6 months (around 03/15/2022).  Cathlean Cower, MD 09/14/2021 8:58 PM [facility]

## 2021-09-13 NOTE — Patient Instructions (Signed)
Please continue all other medications as before, and refills have been done if requested.  Please have the pharmacy call with any other refills you may need.  Please continue your efforts at being more active, low cholesterol diet, and weight control.  Please keep your appointments with your specialists as you may have planned  Please go to the LAB at the blood drawing area for the tests to be done  You will be contacted by phone if any changes need to be made immediately.  Otherwise, you will receive a letter about your results with an explanation, but please check with MyChart first.  Please remember to sign up for MyChart if you have not done so, as this will be important to you in the future with finding out test results, communicating by private email, and scheduling acute appointments online when needed.  Please make an Appointment to return in 6 months, or sooner if needed, with Dr Ronnald Ramp

## 2021-09-14 ENCOUNTER — Encounter: Payer: Self-pay | Admitting: Internal Medicine

## 2021-09-14 ENCOUNTER — Other Ambulatory Visit: Payer: Self-pay | Admitting: Oncology

## 2021-09-14 NOTE — Assessment & Plan Note (Signed)
Lab Results  Component Value Date   LDLCALC 112 (H) 09/13/2021   Uncontrolled, goal ldl < 100, pt to incresae current statin to lipitor 20 qd

## 2021-09-14 NOTE — Assessment & Plan Note (Signed)
BP Readings from Last 3 Encounters:  09/13/21 128/70  08/30/21 124/83  08/10/21 131/63   Stable, pt to continue medical treatment novasc, lozol, losartan, lopressor as below:   Current Outpatient Medications (Cardiovascular):  .  amLODipine (NORVASC) 10 MG tablet, Take 1 tablet (10 mg total) by mouth daily. Marland Kitchen  atorvastatin (LIPITOR) 20 MG tablet, Take 1 tablet (20 mg total) by mouth daily. .  indapamide (LOZOL) 2.5 MG tablet, Take 1 tablet (2.5 mg total) by mouth daily. Marland Kitchen  losartan (COZAAR) 100 MG tablet, Take 1 tablet (100 mg total) by mouth daily. .  metoprolol tartrate (LOPRESSOR) 50 MG tablet, Take 1 tablet (50 mg total) by mouth 2 (two) times daily. .  sildenafil (VIAGRA) 50 MG tablet, Take 1 tablet (50 mg total) by mouth daily as needed for erectile dysfunction.     Current Outpatient Medications (Other):  .  KLOR-CON M20 20 MEQ tablet, TAKE 1 TABLET BY MOUTH EVERY DAY

## 2021-09-14 NOTE — Assessment & Plan Note (Signed)
Recent worsening, for viagra 100 qd prn

## 2021-09-18 ENCOUNTER — Ambulatory Visit (HOSPITAL_COMMUNITY)
Admission: RE | Admit: 2021-09-18 | Discharge: 2021-09-18 | Disposition: A | Discharge status: Home / Self Care | Payer: Medicare Other | Source: Ambulatory Visit | Attending: Oncology | Admitting: Oncology

## 2021-09-18 ENCOUNTER — Encounter (HOSPITAL_COMMUNITY): Payer: Self-pay

## 2021-09-18 DIAGNOSIS — C680 Malignant neoplasm of urethra: Secondary | ICD-10-CM | POA: Diagnosis present

## 2021-09-20 ENCOUNTER — Inpatient Hospital Stay: Payer: Medicare Other

## 2021-09-20 ENCOUNTER — Other Ambulatory Visit: Payer: Self-pay

## 2021-09-20 ENCOUNTER — Inpatient Hospital Stay (HOSPITAL_BASED_OUTPATIENT_CLINIC_OR_DEPARTMENT_OTHER): Payer: Medicare Other | Admitting: Oncology

## 2021-09-20 ENCOUNTER — Inpatient Hospital Stay: Payer: Medicare Other | Attending: Oncology

## 2021-09-20 VITALS — HR 46

## 2021-09-20 VITALS — BP 114/61 | HR 47 | Temp 97.7°F | Resp 17 | Ht 69.0 in | Wt 204.8 lb

## 2021-09-20 DIAGNOSIS — E876 Hypokalemia: Secondary | ICD-10-CM | POA: Diagnosis not present

## 2021-09-20 DIAGNOSIS — C680 Malignant neoplasm of urethra: Secondary | ICD-10-CM

## 2021-09-20 DIAGNOSIS — C609 Malignant neoplasm of penis, unspecified: Secondary | ICD-10-CM

## 2021-09-20 DIAGNOSIS — Z79899 Other long term (current) drug therapy: Secondary | ICD-10-CM | POA: Diagnosis not present

## 2021-09-20 DIAGNOSIS — C679 Malignant neoplasm of bladder, unspecified: Secondary | ICD-10-CM | POA: Diagnosis not present

## 2021-09-20 DIAGNOSIS — E032 Hypothyroidism due to medicaments and other exogenous substances: Secondary | ICD-10-CM

## 2021-09-20 DIAGNOSIS — Z95828 Presence of other vascular implants and grafts: Secondary | ICD-10-CM

## 2021-09-20 DIAGNOSIS — Z5112 Encounter for antineoplastic immunotherapy: Secondary | ICD-10-CM | POA: Insufficient documentation

## 2021-09-20 LAB — CBC WITH DIFFERENTIAL (CANCER CENTER ONLY)
Abs Immature Granulocytes: 0.01 10*3/uL (ref 0.00–0.07)
Basophils Absolute: 0 10*3/uL (ref 0.0–0.1)
Basophils Relative: 0 %
Eosinophils Absolute: 0.5 10*3/uL (ref 0.0–0.5)
Eosinophils Relative: 8 %
HCT: 40.1 % (ref 39.0–52.0)
Hemoglobin: 13.8 g/dL (ref 13.0–17.0)
Immature Granulocytes: 0 %
Lymphocytes Relative: 29 %
Lymphs Abs: 1.8 10*3/uL (ref 0.7–4.0)
MCH: 29.4 pg (ref 26.0–34.0)
MCHC: 34.4 g/dL (ref 30.0–36.0)
MCV: 85.5 fL (ref 80.0–100.0)
Monocytes Absolute: 0.5 10*3/uL (ref 0.1–1.0)
Monocytes Relative: 8 %
Neutro Abs: 3.4 10*3/uL (ref 1.7–7.7)
Neutrophils Relative %: 55 %
Platelet Count: 214 10*3/uL (ref 150–400)
RBC: 4.69 MIL/uL (ref 4.22–5.81)
RDW: 13.5 % (ref 11.5–15.5)
WBC Count: 6.2 10*3/uL (ref 4.0–10.5)
nRBC: 0 % (ref 0.0–0.2)

## 2021-09-20 LAB — CMP (CANCER CENTER ONLY)
ALT: 13 U/L (ref 0–44)
AST: 16 U/L (ref 15–41)
Albumin: 4.2 g/dL (ref 3.5–5.0)
Alkaline Phosphatase: 84 U/L (ref 38–126)
Anion gap: 8 (ref 5–15)
BUN: 21 mg/dL (ref 8–23)
CO2: 29 mmol/L (ref 22–32)
Calcium: 10 mg/dL (ref 8.9–10.3)
Chloride: 101 mmol/L (ref 98–111)
Creatinine: 1.21 mg/dL (ref 0.61–1.24)
GFR, Estimated: >60 mL/min (ref >60)
Glucose, Bld: 141 mg/dL — ABNORMAL HIGH (ref 70–99)
Potassium: 3 mmol/L — ABNORMAL LOW (ref 3.5–5.1)
Sodium: 138 mmol/L (ref 135–145)
Total Bilirubin: 0.6 mg/dL (ref 0.3–1.2)
Total Protein: 7.4 g/dL (ref 6.5–8.1)

## 2021-09-20 LAB — TSH: TSH: 1.307 u[IU]/mL (ref 0.350–4.500)

## 2021-09-20 MED ORDER — SODIUM CHLORIDE 0.9 % IV SOLN: Freq: Once | INTRAVENOUS | Status: AC

## 2021-09-20 MED ORDER — SODIUM CHLORIDE 0.9% FLUSH
10.0000 mL | INTRAVENOUS | Status: DC | PRN
  Administered 2021-09-20: 10 mL

## 2021-09-20 MED ORDER — HEPARIN SOD (PORK) LOCK FLUSH 100 UNIT/ML IV SOLN
500.0000 [IU] | Freq: Once | INTRAVENOUS | Status: AC | PRN
  Administered 2021-09-20: 500 [IU]

## 2021-09-20 MED ORDER — SODIUM CHLORIDE 0.9 % IV SOLN
1200.0000 mg | Freq: Once | INTRAVENOUS | Status: AC
  Administered 2021-09-20: 1200 mg via INTRAVENOUS
  Filled 2021-09-20: qty 20

## 2021-09-20 MED ORDER — SODIUM CHLORIDE 0.9% FLUSH
10.0000 mL | Freq: Once | INTRAVENOUS | Status: AC
  Administered 2021-09-20: 10 mL

## 2021-09-20 NOTE — Progress Notes (Signed)
Hematology and Oncology Follow Up Visit  Mario Hubbard [id number] 04-May-1950 71 y.o. 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  He presented with localized disease in 2015.   Prior Therapy: He underwent surgical resection followed by radiation therapy in 2015.  He subsequently developed metastatic disease with right-sided pelvic lymph node and confirmed the presence of relapsed disease that is PET avid in 2017.   He was treated with carboplatin and Taxol initially with excellent response after 3 cycles. 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   He did show worsening disease in October 2018 and subsequently was started on Tecentriq. He relocated to [place] and received Tecentriq maintenance therapy with excellent response. CT scan in August 2021 continues to show no evidence of disease.  Current therapy: Tecentriq 1200 mg every 3 weeks started in 2018 while living in [place].  He is receiving it locally started on February 09, 2020.  He presents for repeat evaluation.  Interim History: [name] returns today for a follow-up visit.  Since the last visit, he reports no major changes in his health.  He continues to tolerate treatment without any major complaints.  He denies any nausea, vomiting or abdominal pain.  He denies any hospitalizations or illnesses.  His performance status and quality of life remained unchanged.  He denies any shortness of breath or skin rash.  He has a dental abscess that is currently with antibiotics and anticipating dental evaluation next month.  Medications: Reviewed and updated. Current Outpatient Medications  Medication Sig Dispense Refill   amLODipine (NORVASC) 10 MG tablet Take 1 tablet (10 mg total) by mouth daily. 90 tablet 3   atorvastatin (LIPITOR) 20 MG tablet Take 1  tablet (20 mg total) by mouth daily. 90 tablet 3   indapamide (LOZOL) 2.5 MG tablet Take 1 tablet (2.5 mg total) by mouth daily. 90 tablet 3   KLOR-CON M20 20 MEQ tablet TAKE 1 TABLET BY MOUTH EVERY DAY 30 tablet 1   losartan (COZAAR) 100 MG tablet Take 1 tablet (100 mg total) by mouth daily. 90 tablet 3   metoprolol tartrate (LOPRESSOR) 50 MG tablet Take 1 tablet (50 mg total) by mouth 2 (two) times daily. 180 tablet 3   sildenafil (VIAGRA) 50 MG tablet Take 1 tablet (50 mg total) by mouth daily as needed for erectile dysfunction. 10 tablet 11   No current facility-administered medications for this visit.     Allergies: No Known Allergies    Physical Exam:      Blood pressure 114/61, pulse (!) 47, temperature 97.7 F (36.5 C), temperature source Oral, resp. rate 17, height '5\' 9"'$  (1.753 m), weight 204 lb 12.8 oz (92.9 kg), SpO2 95 %.      ECOG: 0    General appearance: Comfortable appearing without any discomfort Head: Normocephalic without any trauma Oropharynx: Mucous membranes are moist and pink without any thrush or ulcers. Eyes: Pupils are equal and round reactive to light. Lymph nodes: No cervical, supraclavicular, inguinal or axillary lymphadenopathy.   Heart:regular rate and rhythm.  S1 and S2 without leg edema. Lung: Clear without any rhonchi or wheezes.  No dullness to percussion. Abdomin: Soft, nontender, nondistended with good bowel sounds.  No hepatosplenomegaly. Musculoskeletal: No joint deformity or effusion.  Full range of motion noted. Neurological: No deficits noted on motor, sensory and deep tendon reflex  exam. Skin: No petechial rash or dryness.  Appeared moist.                         Lab Results: Lab Results  Component Value Date   WBC 5.8 08/30/2021   HGB 13.2 08/30/2021   HCT 38.7 (L) 08/30/2021   MCV 85.1 08/30/2021   PLT 211 08/30/2021   PSA 2.68 09/13/2021     Chemistry      Component Value Date/Time   NA 139  08/30/2021 0813   K 3.1 (L) 08/30/2021 0813   CL 102 08/30/2021 0813   CO2 31 08/30/2021 0813   BUN 19 08/30/2021 0813   CREATININE 1.16 08/30/2021 0813      Component Value Date/Time   CALCIUM 9.6 08/30/2021 0813   ALKPHOS 86 08/30/2021 0813   AST 17 08/30/2021 0813   ALT 13 08/30/2021 0813   BILITOT 0.4 08/30/2021 0813      IMPRESSION: 1. Stable exam. No evidence for recurrent tumor or metastatic disease. 2. Stable appearance of right lobe of thyroid gland nodule. Recommend thyroid US (ref: J Am Coll Radiol. 2015 Feb;12(2): 143-50). 3. Aortic Atherosclerosis (ICD10-I70.0) and Emphysema (ICD10-J43.9).    Impression and Plan:  71 year old with:   1.  Ureteral cancer diagnosed in 2017.  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   His disease status was updated at this time and treatment choices were reviewed.  Risks and benefits of continuing this treatment versus therapy discontinuation were discussed.  CT scan obtained on 09/18/2021 was discussed and showed no evidence of disease relapse.  After discussion today he opted to proceed with treatment today and in July and will discontinue therapy after that.  Plan is to update his staging scans in 6 months.   2. IV access: Port-A-Cath will continue to be in use and flush periodically.   3. Antiemetics: Compazine is available to him without any nausea or vomiting.  4.  Immune mediated complications.  He has not experienced any complications at this time.  I continue to reiterate issues of colitis and hepatitis.  5.  Prognosis and goals of care: His disease is likely incurable although he had an excellent response to therapy.   6.  Hypokalemia: We will continue on potassium supplements and monitor periodically.  7. Follow-up: He will return in 3 weeks for the next cycle of therapy.  30  minutes were spent on this visit.  The time was dedicated to updating his disease status,  treatment choices and future plan of care review.  Zola Button, MD 6/14/20237:27 AM

## 2021-09-20 NOTE — Patient Instructions (Signed)
[facility] MEDICAL [facility]COLOG[facility]  Discharge Instructions: Thank you for choosing [facility] to provide your oncology and hematology care.   If you have a lab appointment with the Cancer Center, please go directly to the Cancer Center and check in at the registration area.   Wear comfortable clothing and clothing appropriate for easy access to any Portacath or PICC line.   We strive to give you quality time with your provider. You may need to reschedule your appointment if you arrive late (15 or more minutes).  Arriving late affects you and other patients whose appointments are after yours.  Also, if you miss three or more appointments without notifying the office, you may be dismissed from the clinic at the provider's discretion.      For prescription refill requests, have your pharmacy contact our office and allow 72 hours for refills to be completed.    Today you received the following chemotherapy and/or immunotherapy agents: Atezolizumab (Tecentriq)      To help prevent nausea and vomiting after your treatment, we encourage you to take your nausea medication as directed.  BELOW ARE SYMPTOMS THAT SHOULD BE REPORTED IMMEDIATELY: *FEVER GREATER THAN 100.4 F (38 C) OR HIGHER *CHILLS OR SWEATING *NAUSEA AND VOMITING THAT IS NOT CONTROLLED WITH YOUR NAUSEA MEDICATION *UNUSUAL SHORTNESS OF BREATH *UNUSUAL BRUISING OR BLEEDING *URINARY PROBLEMS (pain or burning when urinating, or frequent urination) *BOWEL PROBLEMS (unusual diarrhea, constipation, pain near the anus) TENDERNESS IN MOUTH AND THROAT WITH OR WITHOUT PRESENCE OF ULCERS (sore throat, sores in mouth, or a toothache) UNUSUAL RASH, SWELLING OR PAIN  UNUSUAL VAGINAL DISCHARGE OR ITCHING   Items with * indicate a potential emergency and should be followed up as soon as possible or go to the Emergency Department if any problems should occur.  Please show the CHEMOTHERAPY ALERT CARD or IMMUNOTHERAPY ALERT CARD  at check-in to the Emergency Department and triage nurse.  Should you have questions after your visit or need to cancel or reschedule your appointment, please contact [facility]  Dept: [phone]  and follow the prompts.  Office hours are 8:00 a.m. to 4:30 p.m. Monday - Friday. Please note that voicemails left after 4:00 p.m. may not be returned until the following business day.  We are closed weekends and major holidays. You have access to a nurse at all times for urgent questions. Please call the main number to the clinic Dept: [phone] and follow the prompts.   For any non-urgent questions, you may also contact your provider using MyChart. We now offer e-Visits for anyone 18 and older to request care online for non-urgent symptoms. For details visit [email or web address].   Also download the MyChart app! Go to the app store, search "MyChart", open the app, select [facility], and log in with your MyChart username and password.  Masks are optional in the cancer centers. If you would like for your care team to wear a mask while they are taking care of you, please let them know. For doctor visits, patients may have with them one support person who is at least 72 years old. At this time, visitors are not allowed in the infusion area. 

## 2021-10-06 ENCOUNTER — Other Ambulatory Visit: Payer: Self-pay | Admitting: Oncology

## 2021-10-12 ENCOUNTER — Inpatient Hospital Stay: Payer: Medicare Other | Attending: Oncology | Admitting: Oncology

## 2021-10-12 ENCOUNTER — Inpatient Hospital Stay: Payer: Medicare Other

## 2021-10-12 ENCOUNTER — Other Ambulatory Visit: Payer: Self-pay

## 2021-10-12 VITALS — BP 155/73 | HR 60 | Temp 97.7°F | Resp 16 | Ht 69.0 in | Wt 204.0 lb

## 2021-10-12 DIAGNOSIS — C680 Malignant neoplasm of urethra: Secondary | ICD-10-CM

## 2021-10-12 DIAGNOSIS — Z79899 Other long term (current) drug therapy: Secondary | ICD-10-CM | POA: Diagnosis not present

## 2021-10-12 DIAGNOSIS — Z5112 Encounter for antineoplastic immunotherapy: Secondary | ICD-10-CM | POA: Diagnosis not present

## 2021-10-12 DIAGNOSIS — Z95828 Presence of other vascular implants and grafts: Secondary | ICD-10-CM

## 2021-10-12 DIAGNOSIS — E876 Hypokalemia: Secondary | ICD-10-CM | POA: Diagnosis not present

## 2021-10-12 DIAGNOSIS — E032 Hypothyroidism due to medicaments and other exogenous substances: Secondary | ICD-10-CM

## 2021-10-12 DIAGNOSIS — C609 Malignant neoplasm of penis, unspecified: Secondary | ICD-10-CM

## 2021-10-12 DIAGNOSIS — C679 Malignant neoplasm of bladder, unspecified: Secondary | ICD-10-CM | POA: Insufficient documentation

## 2021-10-12 LAB — CBC WITH DIFFERENTIAL (CANCER CENTER ONLY)
Abs Immature Granulocytes: 0.02 10*3/uL (ref 0.00–0.07)
Basophils Absolute: 0 10*3/uL (ref 0.0–0.1)
Basophils Relative: 0 %
Eosinophils Absolute: 0.5 10*3/uL (ref 0.0–0.5)
Eosinophils Relative: 9 %
HCT: 38.5 % — ABNORMAL LOW (ref 39.0–52.0)
Hemoglobin: 13.1 g/dL (ref 13.0–17.0)
Immature Granulocytes: 0 %
Lymphocytes Relative: 32 %
Lymphs Abs: 1.7 10*3/uL (ref 0.7–4.0)
MCH: 29.1 pg (ref 26.0–34.0)
MCHC: 34 g/dL (ref 30.0–36.0)
MCV: 85.6 fL (ref 80.0–100.0)
Monocytes Absolute: 0.4 10*3/uL (ref 0.1–1.0)
Monocytes Relative: 8 %
Neutro Abs: 2.6 10*3/uL (ref 1.7–7.7)
Neutrophils Relative %: 51 %
Platelet Count: 200 10*3/uL (ref 150–400)
RBC: 4.5 MIL/uL (ref 4.22–5.81)
RDW: 13.7 % (ref 11.5–15.5)
WBC Count: 5.2 10*3/uL (ref 4.0–10.5)
nRBC: 0 % (ref 0.0–0.2)

## 2021-10-12 LAB — CMP (CANCER CENTER ONLY)
ALT: 15 U/L (ref 0–44)
AST: 21 U/L (ref 15–41)
Albumin: 4.1 g/dL (ref 3.5–5.0)
Alkaline Phosphatase: 82 U/L (ref 38–126)
Anion gap: 8 (ref 5–15)
BUN: 20 mg/dL (ref 8–23)
CO2: 28 mmol/L (ref 22–32)
Calcium: 9.6 mg/dL (ref 8.9–10.3)
Chloride: 103 mmol/L (ref 98–111)
Creatinine: 1.07 mg/dL (ref 0.61–1.24)
GFR, Estimated: >60 mL/min (ref >60)
Glucose, Bld: 143 mg/dL — ABNORMAL HIGH (ref 70–99)
Potassium: 3.1 mmol/L — ABNORMAL LOW (ref 3.5–5.1)
Sodium: 139 mmol/L (ref 135–145)
Total Bilirubin: 0.7 mg/dL (ref 0.3–1.2)
Total Protein: 7.2 g/dL (ref 6.5–8.1)

## 2021-10-12 LAB — TSH: TSH: 1.135 u[IU]/mL (ref 0.350–4.500)

## 2021-10-12 MED ORDER — SODIUM CHLORIDE 0.9 % IV SOLN
1200.0000 mg | Freq: Once | INTRAVENOUS | Status: AC
  Administered 2021-10-12: 1200 mg via INTRAVENOUS
  Filled 2021-10-12: qty 20

## 2021-10-12 MED ORDER — SODIUM CHLORIDE 0.9 % IV SOLN: Freq: Once | INTRAVENOUS | Status: AC

## 2021-10-12 MED ORDER — SODIUM CHLORIDE 0.9% FLUSH
10.0000 mL | Freq: Once | INTRAVENOUS | Status: AC
  Administered 2021-10-12: 10 mL

## 2021-10-12 MED ORDER — SODIUM CHLORIDE 0.9% FLUSH
10.0000 mL | INTRAVENOUS | Status: DC | PRN
  Administered 2021-10-12: 10 mL

## 2021-10-12 MED ORDER — HEPARIN SOD (PORK) LOCK FLUSH 100 UNIT/ML IV SOLN
500.0000 [IU] | Freq: Once | INTRAVENOUS | Status: AC | PRN
  Administered 2021-10-12: 500 [IU]

## 2021-10-12 NOTE — Patient Instructions (Signed)
[facility] ONCOLOGY  Discharge Instructions: Thank you for choosing [facility] to provide your oncology and hematology care.   If you have a lab appointment with the [facility], please go directly to the [facility] and check in at the registration area.   Wear comfortable clothing and clothing appropriate for easy access to any Portacath or PICC line.   We strive to give you quality time with your provider. You may need to reschedule your appointment if you arrive late (15 or more minutes).  Arriving late affects you and other patients whose appointments are after yours.  Also, if you miss three or more appointments without notifying the office, you may be dismissed from the clinic at the provider's discretion.      For prescription refill requests, have your pharmacy contact our office and allow 72 hours for refills to be completed.    Today you received the following chemotherapy and/or immunotherapy agents: Atezolizumab (Tecentriq)      To help prevent nausea and vomiting after your treatment, we encourage you to take your nausea medication as directed.  BELOW ARE SYMPTOMS THAT SHOULD BE REPORTED IMMEDIATELY: *FEVER GREATER THAN 100.4 F (38 C) OR HIGHER *CHILLS OR SWEATING *NAUSEA AND VOMITING THAT IS NOT CONTROLLED WITH YOUR NAUSEA MEDICATION *UNUSUAL SHORTNESS OF BREATH *UNUSUAL BRUISING OR BLEEDING *URINARY PROBLEMS (pain or burning when urinating, or frequent urination) *BOWEL PROBLEMS (unusual diarrhea, constipation, pain near the anus) TENDERNESS IN MOUTH AND THROAT WITH OR WITHOUT PRESENCE OF ULCERS (sore throat, sores in mouth, or a toothache) UNUSUAL RASH, SWELLING OR PAIN  UNUSUAL VAGINAL DISCHARGE OR ITCHING   Items with * indicate a potential emergency and should be followed up as soon as possible or go to the Emergency Department if any problems should occur.  Please show the CHEMOTHERAPY ALERT CARD or IMMUNOTHERAPY ALERT CARD  at check-in to the Emergency Department and triage nurse.  Should you have questions after your visit or need to cancel or reschedule your appointment, please contact [facility]  Dept: [phone]  and follow the prompts.  Office hours are 8:00 a.m. to 4:30 p.m. Monday - Friday. Please note that voicemails left after 4:00 p.m. may not be returned until the following business day.  We are closed weekends and major holidays. You have access to a nurse at all times for urgent questions. Please call the main number to the clinic Dept: [phone] and follow the prompts.   For any non-urgent questions, you may also contact your provider using MyChart. We now offer e-Visits for anyone 62 and older to request care online for non-urgent symptoms. For details visit [email or web address].   Also download the MyChart app! Go to the app store, search "MyChart", open the app, select [facility], and log in with your MyChart username and password.  Masks are optional in the cancer centers. If you would like for your care team to wear a mask while they are taking care of you, please let them know. For doctor visits, patients may have with them one support person who is at least 72 years old. At this time, visitors are not allowed in the infusion area.

## 2021-10-12 NOTE — Progress Notes (Signed)
Hematology and Oncology Follow Up Visit  Mario Hubbard [id number] Apr 10, 1950 71 y.o. 10/12/2021 8:07 AM [name], MDJones, [name], MD   Principle Diagnosis: 71 year old man with cancer of the urethra diagnosed in 2015.  He presented with a distal penile mass and adenopathy.  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   He achieved a complete response to immunotherapy.   Prior Therapy: He underwent surgical resection followed by radiation therapy in 2015.  He subsequently developed metastatic disease with right-sided pelvic lymph node and confirmed the presence of relapsed disease that is PET avid in 2017.   He was treated with carboplatin and Taxol initially with excellent response after 3 cycles. 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   He did show worsening disease in October 2018 and subsequently was started on Tecentriq. He relocated to [place] and received Tecentriq maintenance therapy with excellent response. CT scan in August 2021 continues to show no evidence of disease.  Current therapy: Tecentriq 1200 mg every 3 weeks started in 2018 while living in [place].  He is receiving it locally started on February 09, 2020.  He is here for the next cycle of therapy.  Interim History: Mario Hubbard is here for follow-up.  Since the last visit, he reports feeling well without any complaints.  He denies any nausea, vomiting or changes in his bowels.  He denies any skin rashes or lesions.  He denies any hospitalizations or illnesses.  His performance status quality of life remained excellent.  Medications: Updated on review. Current Outpatient Medications  Medication Sig Dispense Refill   amLODipine (NORVASC) 10 MG tablet Take 1 tablet (10 mg total) by mouth daily. 90 tablet 3   atorvastatin (LIPITOR) 20 MG tablet Take 1 tablet (20 mg total) by mouth daily. 90 tablet 3    indapamide (LOZOL) 2.5 MG tablet Take 1 tablet (2.5 mg total) by mouth daily. 90 tablet 3   KLOR-CON M20 20 MEQ tablet TAKE 1 TABLET BY MOUTH EVERY DAY 30 tablet 1   losartan (COZAAR) 100 MG tablet Take 1 tablet (100 mg total) by mouth daily. 90 tablet 3   metoprolol tartrate (LOPRESSOR) 50 MG tablet Take 1 tablet (50 mg total) by mouth 2 (two) times daily. 180 tablet 3   sildenafil (VIAGRA) 50 MG tablet Take 1 tablet (50 mg total) by mouth daily as needed for erectile dysfunction. 10 tablet 11   No current facility-administered medications for this visit.     Allergies: No Known Allergies    Physical Exam:      Blood pressure (!) 155/73, pulse 60, temperature 97.7 F (36.5 C), temperature source Temporal, resp. rate 16, height '5\' 9"'$  (1.753 m), weight 204 lb (92.5 kg), SpO2 100 %.       ECOG: 0   General appearance: Alert, awake without any distress. Head: Atraumatic without abnormalities Oropharynx: Without any thrush or ulcers. Eyes: No scleral icterus. Lymph nodes: No lymphadenopathy noted in the cervical, supraclavicular, or axillary nodes Heart:regular rate and rhythm, without any murmurs or gallops.   Lung: Clear to auscultation without any rhonchi, wheezes or dullness to percussion. Abdomin: Soft, nontender without any shifting dullness or ascites. Musculoskeletal: No clubbing or cyanosis. Neurological: No motor or sensory deficits. Skin: No rashes or lesions.                        Lab Results: Lab Results  Component Value Date   WBC 6.2 09/20/2021   HGB 13.8 09/20/2021   HCT 40.1 09/20/2021   MCV 85.5 09/20/2021   PLT 214 09/20/2021   PSA 2.68 09/13/2021     Chemistry      Component Value Date/Time   NA 138 09/20/2021 0751   K 3.0 (L) 09/20/2021 0751   CL 101 09/20/2021 0751   CO2 29 09/20/2021 0751   BUN 21 09/20/2021 0751   CREATININE 1.21 09/20/2021 0751      Component Value Date/Time   CALCIUM 10.0 09/20/2021 0751    ALKPHOS 84 09/20/2021 0751   AST 16 09/20/2021 0751   ALT 13 09/20/2021 0751   BILITOT 0.6 09/20/2021 0751        Impression and Plan:  71 year old with:   1. Stage IV high-grade urothelial carcinoma with pelvic adenopathy documented in 2017.  He initially presented with distal penile mass of the urethra in 2015.   He achieved a complete response to immunotherapy and scheduled to receive another cycle today.  Risks and benefits of proceeding with treatment were reviewed.  I have recommended discontinuation of therapy given his excellent response.  Repeat imaging studies will be recommended and the use of salvage therapy will be deferred unless he develops recurrent disease.  He is agreeable to proceed with this plan.   2. IV access: Port-A-Cath currently in place and will be flushed periodically.   3. Antiemetics: No nausea or vomiting reported at this time.  Compazine is available to him.  4.  Immune mediated complications.  I continue to educate him about potential complications of pneumonitis, colitis and thyroid disease.  5.  Prognosis and goals of care: Therapy remains palliative although aggressive measures are warranted given his excellent performance status.   6.  Hypokalemia: He remains on potassium supplements and counseled to check periodically off treatment.  7. Follow-up: He will return every 2 months for Port-A-Cath flush and MD follow-up in 6 months and repeat imaging.  30  minutes were dedicated to this encounter.  The time was spent on reviewing laboratory data, disease status update and future treatment choices.  Zola Button, MD 7/6/20238:07 AM

## 2021-10-20 ENCOUNTER — Telehealth: Payer: Self-pay | Admitting: Oncology

## 2021-10-20 NOTE — Telephone Encounter (Signed)
Called patient regarding all upcoming appointments, patient has been called and notified. 

## 2021-10-30 ENCOUNTER — Other Ambulatory Visit: Payer: Self-pay

## 2021-11-05 ENCOUNTER — Other Ambulatory Visit: Payer: Self-pay | Admitting: Oncology

## 2021-11-06 ENCOUNTER — Other Ambulatory Visit: Payer: Self-pay | Admitting: *Deleted

## 2021-11-06 DIAGNOSIS — C680 Malignant neoplasm of urethra: Secondary | ICD-10-CM

## 2021-11-06 MED ORDER — POTASSIUM CHLORIDE CRYS ER 20 MEQ PO TBCR: 20.0000 meq | EXTENDED_RELEASE_TABLET | Freq: Every day | ORAL | 1 refills | Status: DC

## 2021-11-07 ENCOUNTER — Encounter: Payer: Self-pay | Admitting: Oncology

## 2021-12-02 ENCOUNTER — Other Ambulatory Visit: Payer: Self-pay | Admitting: Oncology

## 2021-12-02 DIAGNOSIS — C680 Malignant neoplasm of urethra: Secondary | ICD-10-CM

## 2021-12-04 ENCOUNTER — Encounter: Payer: Self-pay | Admitting: Oncology

## 2021-12-14 ENCOUNTER — Inpatient Hospital Stay: Payer: Medicare Other | Attending: Oncology

## 2021-12-14 DIAGNOSIS — E876 Hypokalemia: Secondary | ICD-10-CM | POA: Diagnosis not present

## 2021-12-14 DIAGNOSIS — Z79899 Other long term (current) drug therapy: Secondary | ICD-10-CM | POA: Diagnosis not present

## 2021-12-14 DIAGNOSIS — C680 Malignant neoplasm of urethra: Secondary | ICD-10-CM

## 2021-12-14 DIAGNOSIS — Z8559 Personal history of malignant neoplasm of other urinary tract organ: Secondary | ICD-10-CM | POA: Insufficient documentation

## 2021-12-14 DIAGNOSIS — Z95828 Presence of other vascular implants and grafts: Secondary | ICD-10-CM

## 2021-12-14 DIAGNOSIS — E032 Hypothyroidism due to medicaments and other exogenous substances: Secondary | ICD-10-CM

## 2021-12-14 DIAGNOSIS — C609 Malignant neoplasm of penis, unspecified: Secondary | ICD-10-CM

## 2021-12-14 LAB — CBC WITH DIFFERENTIAL (CANCER CENTER ONLY)
Abs Immature Granulocytes: 0.01 10*3/uL (ref 0.00–0.07)
Basophils Absolute: 0 10*3/uL (ref 0.0–0.1)
Basophils Relative: 0 %
Eosinophils Absolute: 0.3 10*3/uL (ref 0.0–0.5)
Eosinophils Relative: 5 %
HCT: 38.3 % — ABNORMAL LOW (ref 39.0–52.0)
Hemoglobin: 13.1 g/dL (ref 13.0–17.0)
Immature Granulocytes: 0 %
Lymphocytes Relative: 38 %
Lymphs Abs: 2.2 10*3/uL (ref 0.7–4.0)
MCH: 29.2 pg (ref 26.0–34.0)
MCHC: 34.2 g/dL (ref 30.0–36.0)
MCV: 85.5 fL (ref 80.0–100.0)
Monocytes Absolute: 0.5 10*3/uL (ref 0.1–1.0)
Monocytes Relative: 9 %
Neutro Abs: 2.7 10*3/uL (ref 1.7–7.7)
Neutrophils Relative %: 48 %
Platelet Count: 205 10*3/uL (ref 150–400)
RBC: 4.48 MIL/uL (ref 4.22–5.81)
RDW: 13.8 % (ref 11.5–15.5)
WBC Count: 5.7 10*3/uL (ref 4.0–10.5)
nRBC: 0 % (ref 0.0–0.2)

## 2021-12-14 LAB — CMP (CANCER CENTER ONLY)
ALT: 16 U/L (ref 0–44)
AST: 18 U/L (ref 15–41)
Albumin: 4.2 g/dL (ref 3.5–5.0)
Alkaline Phosphatase: 79 U/L (ref 38–126)
Anion gap: 4 — ABNORMAL LOW (ref 5–15)
BUN: 20 mg/dL (ref 8–23)
CO2: 32 mmol/L (ref 22–32)
Calcium: 9.8 mg/dL (ref 8.9–10.3)
Chloride: 102 mmol/L (ref 98–111)
Creatinine: 1.11 mg/dL (ref 0.61–1.24)
GFR, Estimated: >60 mL/min (ref >60)
Glucose, Bld: 91 mg/dL (ref 70–99)
Potassium: 3.2 mmol/L — ABNORMAL LOW (ref 3.5–5.1)
Sodium: 138 mmol/L (ref 135–145)
Total Bilirubin: 0.6 mg/dL (ref 0.3–1.2)
Total Protein: 7.4 g/dL (ref 6.5–8.1)

## 2021-12-14 LAB — TSH: TSH: 1.08 u[IU]/mL (ref 0.350–4.500)

## 2021-12-14 MED ORDER — HEPARIN SOD (PORK) LOCK FLUSH 100 UNIT/ML IV SOLN
500.0000 [IU] | Freq: Once | INTRAVENOUS | Status: AC
  Administered 2021-12-14: 500 [IU]

## 2021-12-14 MED ORDER — SODIUM CHLORIDE 0.9% FLUSH
10.0000 mL | Freq: Once | INTRAVENOUS | Status: AC
  Administered 2021-12-14: 10 mL

## 2021-12-21 ENCOUNTER — Ambulatory Visit (INDEPENDENT_AMBULATORY_CARE_PROVIDER_SITE_OTHER): Payer: Medicare Other

## 2021-12-21 VITALS — Ht 69.0 in | Wt 203.0 lb

## 2021-12-21 DIAGNOSIS — Z0000 Encounter for general adult medical examination without abnormal findings: Secondary | ICD-10-CM

## 2021-12-21 NOTE — Progress Notes (Signed)
Subjective:   Mancel Lardizabal is a 71 y.o. male who presents for Medicare Annual/Subsequent preventive examination.  Virtual Visit via Telephone Note  I connected with  [name] on 12/21/21 at 10:30 AM EDT by telephone and verified that I am speaking with the correct person using two identifiers.  Location: Patient: Home Provider: [place] Persons participating in the virtual visit: [place]   I discussed the limitations, risks, security and privacy concerns of performing an evaluation and management service by telephone and the availability of in person appointments. The patient expressed understanding and agreed to proceed.  Interactive audio and video telecommunications were attempted between this nurse and patient, however failed, due to patient having technical difficulties OR patient did not have access to video capability.  We continued and completed visit with audio only.  Some vital signs may be absent or patient reported.   Sheral Flow, LPN   Review of systems:  Cardiac Risk Factors include: advanced age (>53mn, >>57women);dyslipidemia;family history of premature cardiovascular disease;hypertension;male gender     Today's Vitals   12/21/21 1040  Weight: 203 lb (92.1 kg)  Height: '5\' 9"'$  (1.753 m)   Body mass index is 29.98 kg/m.     12/21/2021   10:42 AM 09/20/2021    8:51 AM 08/10/2021    9:27 AM 02/01/2021    9:03 AM 12/20/2020    5:12 PM 09/27/2020    8:49 AM 09/07/2020    9:02 AM  Advanced Directives  Does Patient Have a Medical Advance Directive? No No No No No No No  Would patient like information on creating a medical advance directive? No - Patient declined No - Patient declined No - Patient declined No - Patient declined Yes (ED - Information included in AVS) No - Patient declined No - Patient declined    Current Medications (verified) Outpatient Encounter Medications as of 12/21/2021  Medication Sig   amLODipine  (NORVASC) 10 MG tablet Take 1 tablet (10 mg total) by mouth daily.   atorvastatin (LIPITOR) 20 MG tablet Take 1 tablet (20 mg total) by mouth daily.   indapamide (LOZOL) 2.5 MG tablet Take 1 tablet (2.5 mg total) by mouth daily.   KLOR-CON M20 20 MEQ tablet TAKE 1 TABLET BY MOUTH EVERY DAY   KLOR-CON M20 20 MEQ tablet TAKE 1 TABLET BY MOUTH EVERY DAY   losartan (COZAAR) 100 MG tablet Take 1 tablet (100 mg total) by mouth daily.   metoprolol tartrate (LOPRESSOR) 50 MG tablet Take 1 tablet (50 mg total) by mouth 2 (two) times daily.   sildenafil (VIAGRA) 50 MG tablet Take 1 tablet (50 mg total) by mouth daily as needed for erectile dysfunction.   No facility-administered encounter medications on file as of 12/21/2021.    Allergies (verified) Patient has no known allergies.   History: Past Medical History:  Diagnosis Date   Arthritis    Hypertension    No past surgical history on file. Family History  Problem Relation Age of Onset   Kidney disease Mother    Hypertension Mother    Healthy Sister    Diabetes Brother    Social History   Socioeconomic History   Marital status: Married    Spouse name: Not on file   Number of children: Not on file   Years of education: Not on file   Highest education level: Not on file  Occupational History   Not on file  Tobacco Use   Smoking status: Former  Smokeless tobacco: Never  Substance and Sexual Activity   Alcohol use: Never   Drug use: Never   Sexual activity: Not Currently    Partners: Female  Other Topics Concern   Not on file  Social History Narrative   Not on file   Social Determinants of Health   Financial Resource Strain: Low Risk  (12/21/2021)   Overall Financial Resource Strain (CARDIA)    Difficulty of Paying Living Expenses: Not hard at all  Food Insecurity: No Food Insecurity (12/21/2021)   Hunger Vital Sign    Worried About Running Out of Food in the Last Year: Never true    Ran Out of Food in the Last Year:  Never true  Transportation Needs: No Transportation Needs (12/21/2021)   PRAPARE - Hydrologist (Medical): No    Lack of Transportation (Non-Medical): No  Physical Activity: Sufficiently Active (12/21/2021)   Exercise Vital Sign    Days of Exercise per Week: 5 days    Minutes of Exercise per Session: 30 min  Stress: No Stress Concern Present (12/21/2021)   [place]    Feeling of Stress : Not at all  Social Connections: [place] (12/21/2021)   Social Connection and Isolation Panel [NHANES]    Frequency of Communication with Friends and Family: More than three times a week    Frequency of Social Gatherings with Friends and Family: More than three times a week    Attends Religious Services: More than 4 times per year    Active Member of [employer] or Organizations: Yes    Attends Music therapist: More than 4 times per year    Marital Status: Married    Tobacco Counseling Counseling given: Not Answered   Clinical Intake:  Pre-visit preparation completed: Yes  Pain : No/denies pain     BMI - recorded: 29.98 Nutritional Risks: None Diabetes: No  How often do you need to have someone help you when you read instructions, pamphlets, or other written materials from your doctor or pharmacy?: 1 - Never What is the last grade level you completed in school?: HSG; Bachelor's Degree  Diabetic? no  Interpreter Needed?: No  Information entered by :: Lisette Abu, LPN.   Activities of Daily Living    12/21/2021   10:45 AM  In your present state of health, do you have any difficulty performing the following activities:  Hearing? 0  Vision? 0  Difficulty concentrating or making decisions? 0  Walking or climbing stairs? 0  Dressing or bathing? 0  Doing errands, shopping? 0  Preparing Food and eating ? N  Using the Toilet? N  In the past six months, have you  accidently leaked urine? N  Do you have problems with loss of bowel control? N  Managing your Medications? N  Managing your Finances? N  Housekeeping or managing your Housekeeping? N    Patient Care Team: [name], MD as PCP - General (Internal Medicine)  Indicate any recent Medical Services you may have received from other than [facility] providers in the past year (date may be approximate).     Assessment:   This is a routine wellness examination for Lacy.  Hearing/Vision screen Hearing Screening - Comments:: Denies hearing difficulties   Vision Screening - Comments:: Wears rx glasses - not up to date with routine eye exams due to new insurance.   Dietary issues and exercise activities discussed: Current Exercise Habits: Home  exercise routine, Type of exercise: walking, Time (Minutes): 30, Frequency (Times/Week): 5, Weekly Exercise (Minutes/Week): 150, Intensity: Moderate, Exercise limited by: None identified   Goals Addressed             This Visit's Progress    Staying healthy, maintaining my health and enjoy fishing.        Depression Screen    12/21/2021   11:52 AM 12/21/2021   10:49 AM 09/13/2021    3:43 PM 09/13/2021    3:23 PM 12/20/2020    5:08 PM 04/04/2020    1:02 PM  PHQ 2/9 Scores  PHQ - 2 Score 0 0 0 0 0 0  PHQ- 9 Score    0      Fall Risk    12/21/2021   10:44 AM 09/13/2021    3:43 PM 09/13/2021    3:23 PM 12/20/2020    5:04 PM  [place] in the past year? 0 0 0 0  Number falls in past yr: 0 0 0 0  Injury with Fall? 0 0 0 0  Risk for fall due to : No Fall Risks     Follow up Falls prevention discussed       FALL RISK PREVENTION PERTAINING TO THE HOME:  Any stairs in or around the home? No  If so, are there any without handrails? No  Home free of loose throw rugs in walkways, pet beds, electrical cords, etc? Yes  Adequate lighting in your home to reduce risk of falls? Yes   ASSISTIVE DEVICES UTILIZED TO PREVENT FALLS:  Life alert?  No  Use of a cane, walker or w/c? No  Grab bars in the bathroom? Yes  Shower chair or bench in shower? No  Elevated toilet seat or a handicapped toilet? Yes   TIMED UP AND GO: Phone Visit  Was the test performed? No .   Cognitive Function:        12/21/2021   10:49 AM 12/20/2020    5:10 PM  6CIT Screen  What Year? 0 points 0 points  What month? 0 points 0 points  What time? 0 points 0 points  Count back from 20 0 points 0 points  Months in reverse 0 points 0 points  Repeat phrase 0 points 2 points  Total Score 0 points 2 points    Immunizations Immunization History  Administered Date(s) Administered   Fluad Quad(high Dose 65+) 12/28/2020   Influenza-Unspecified 01/05/2020   PFIZER(Purple Top)SARS-COV-2 Vaccination 05/20/2019, 06/17/2019, 03/22/2020   Pneumococcal Conjugate-13 10/09/2016   Pneumococcal Polysaccharide-23 01/17/2018   Tdap 05/14/2017    TDAP status: Up to date  Flu Vaccine status: Due, Education has been provided regarding the importance of this vaccine. Advised may receive this vaccine at local pharmacy or Health Dept. Aware to provide a copy of the vaccination record if obtained from local pharmacy or Health Dept. Verbalized acceptance and understanding.  Pneumococcal vaccine status: Up to date  Covid-19 vaccine status: Completed vaccines  Qualifies for Shingles Vaccine? Yes   Zostavax completed No   Shingrix Completed?: No.    Education has been provided regarding the importance of this vaccine. Patient has been advised to call insurance company to determine out of pocket expense if they have not yet received this vaccine. Advised may also receive vaccine at local pharmacy or Health Dept. Verbalized acceptance and understanding.  Screening Tests Health Maintenance  Topic Date Due   Hepatitis C Screening  Never done   Zoster Vaccines-  Shingrix (1 of 2) Never done   COVID-19 Vaccine (4 - Pfizer risk series) 05/17/2020   INFLUENZA VACCINE   11/07/2021   COLONOSCOPY (Pts 45-65yr Insurance coverage will need to be confirmed)  10/19/2024   TETANUS/TDAP  05/15/2027   Pneumonia Vaccine 71 Years old  Completed   HPV VACCINES  Aged Out    Health Maintenance  Health Maintenance Due  Topic Date Due   Hepatitis C Screening  Never done   Zoster Vaccines- Shingrix (1 of 2) Never done   COVID-19 Vaccine (4 - Pfizer risk series) 05/17/2020   INFLUENZA VACCINE  11/07/2021    Colorectal cancer screening: Type of screening: Colonoscopy. Completed 10/20/2014. Repeat every 10 years  Lung Cancer Screening: (Low Dose CT Chest recommended if Age 806-80years, 30 pack-year currently smoking OR have quit w/in 15years.) does not qualify.   Lung Cancer Screening Referral: no  Additional Screening:  Hepatitis C Screening: does qualify; Completed postponed by pcp  Vision Screening: Recommended annual ophthalmology exams for early detection of glaucoma and other disorders of the eye. Is the patient up to date with their annual eye exam?  No  Who is the provider or what is the name of the office in which the patient attends annual eye exams? No due to new vision insurance If pt is not established with a provider, would they like to be referred to a provider to establish care? No .   Dental Screening: Recommended annual dental exams for proper oral hygiene  Community Resource Referral / Chronic Care Management: CRR required this visit?  No   CCM required this visit?  No      Plan:     I have personally reviewed and noted the following in the patient's chart:   Medical and social history Use of alcohol, tobacco or illicit drugs  Current medications and supplements including opioid prescriptions. Patient is not currently taking opioid prescriptions. Functional ability and status Nutritional status Physical activity Advanced directives List of other physicians Hospitalizations, surgeries, and ER visits in previous 12  months Vitals Screenings to include cognitive, depression, and falls Referrals and appointments  In addition, I have reviewed and discussed with patient certain preventive protocols, quality metrics, and best practice recommendations. A written personalized care plan for preventive services as well as general preventive health recommendations were provided to patient.     SSheral Flow LPN   [id number]/29/5621  Nurse Notes:  Patient provided weight.

## 2021-12-21 NOTE — Patient Instructions (Addendum)
Mario Hubbard , Thank you for taking time to come for your Medicare Wellness Visit. I appreciate your ongoing commitment to your health goals. Please review the following plan we discussed and let me know if I can assist you in the future.   Screening recommendations/referrals: Colonoscopy: 10/20/2014; due every 10 years Recommended yearly ophthalmology/optometry visit for glaucoma screening and checkup Recommended yearly dental visit for hygiene and checkup  Vaccinations: Influenza vaccine: due Fall 2023 Pneumococcal vaccine: 10/09/16, 01/17/2018 Tdap vaccine: 05/14/2017; due every 10 years Shingles vaccine: due   Covid-19: 05/20/2019, 06/17/2019, 03/22/2020  Advanced directives: No; in the process of completing documents.  Conditions/risks identified: Yes  Next appointment: Follow up in one year for your annual wellness visit on 12/24/2022 at 11:00 a.m. via phone with Nurse Mignon Pine.  Preventive Care 72 Years and Older, Male  Preventive care refers to lifestyle choices and visits with your health care provider that can promote health and wellness. What does preventive care include? A yearly physical exam. This is also called an annual well check. Dental exams once or twice a year. Routine eye exams. Ask your health care provider how often you should have your eyes checked. Personal lifestyle choices, including: Daily care of your teeth and gums. Regular physical activity. Eating a healthy diet. Avoiding tobacco and drug use. Limiting alcohol use. Practicing safe sex. Taking low doses of aspirin every day. Taking vitamin and mineral supplements as recommended by your health care provider. What happens during an annual well check? The services and screenings done by your health care provider during your annual well check will depend on your age, overall health, lifestyle risk factors, and family history of disease. Counseling  Your health care provider may ask you questions about  your: Alcohol use. Tobacco use. Drug use. Emotional well-being. Home and relationship well-being. Sexual activity. Eating habits. History of falls. Memory and ability to understand (cognition). Work and work Statistician. Screening  You may have the following tests or measurements: Height, weight, and BMI. Blood pressure. Lipid and cholesterol levels. These may be checked every 5 years, or more frequently if you are over 26 years old. Skin check. Lung cancer screening. You may have this screening every year starting at age 72 if you have a 30-pack-year history of smoking and currently smoke or have quit within the past 15 years. Fecal occult blood test (FOBT) of the stool. You may have this test every year starting at age 72. Flexible sigmoidoscopy or colonoscopy. You may have a sigmoidoscopy every 5 years or a colonoscopy every 10 years starting at age 72. Prostate cancer screening. Recommendations will vary depending on your family history and other risks. Hepatitis C blood test. Hepatitis B blood test. Sexually transmitted disease (STD) testing. Diabetes screening. This is done by checking your blood sugar (glucose) after you have not eaten for a while (fasting). You may have this done every 1-3 years. Abdominal aortic aneurysm (AAA) screening. You may need this if you are a current or former smoker. Osteoporosis. You may be screened starting at age 3 if you are at high risk. Talk with your health care provider about your test results, treatment options, and if necessary, the need for more tests. Vaccines  Your health care provider may recommend certain vaccines, such as: Influenza vaccine. This is recommended every year. Tetanus, diphtheria, and acellular pertussis (Tdap, Td) vaccine. You may need a Td booster every 10 years. Zoster vaccine. You may need this after age 72. Pneumococcal 13-valent conjugate (PCV13) vaccine. One  dose is recommended after age 72. Pneumococcal  polysaccharide (PPSV23) vaccine. One dose is recommended after age 72. Talk to your health care provider about which screenings and vaccines you need and how often you need them. This information is not intended to replace advice given to you by your health care provider. Make sure you discuss any questions you have with your health care provider. Document Released: 04/22/2015 Document Revised: 12/14/2015 Document Reviewed: 01/25/2015 Elsevier Interactive Patient Education  2017 [employer] Prevention in the Home Falls can cause injuries. They can happen to people of all ages. There are many things you can do to make your home safe and to help prevent falls. What can I do on the outside of my home? Regularly fix the edges of walkways and driveways and fix any cracks. Remove anything that might make you trip as you walk through a door, such as a raised step or threshold. Trim any bushes or trees on the path to your home. Use bright outdoor lighting. Clear any walking paths of anything that might make someone trip, such as rocks or tools. Regularly check to see if handrails are loose or broken. Make sure that both sides of any steps have handrails. Any raised decks and porches should have guardrails on the edges. Have any leaves, snow, or ice cleared regularly. Use sand or salt on walking paths during winter. Clean up any spills in your garage right away. This includes oil or grease spills. What can I do in the bathroom? Use night lights. Install grab bars by the toilet and in the tub and shower. Do not use towel bars as grab bars. Use non-skid mats or decals in the tub or shower. If you need to sit down in the shower, use a plastic, non-slip stool. Keep the floor dry. Clean up any water that spills on the floor as soon as it happens. Remove soap buildup in the tub or shower regularly. Attach bath mats securely with double-sided non-slip rug tape. Do not have throw rugs and other  things on the floor that can make you trip. What can I do in the bedroom? Use night lights. Make sure that you have a light by your bed that is easy to reach. Do not use any sheets or blankets that are too big for your bed. They should not hang down onto the floor. Have a firm chair that has side arms. You can use this for support while you get dressed. Do not have throw rugs and other things on the floor that can make you trip. What can I do in the kitchen? Clean up any spills right away. Avoid walking on wet floors. Keep items that you use a lot in easy-to-reach places. If you need to reach something above you, use a strong step stool that has a grab bar. Keep electrical cords out of the way. Do not use floor polish or wax that makes floors slippery. If you must use wax, use non-skid floor wax. Do not have throw rugs and other things on the floor that can make you trip. What can I do with my stairs? Do not leave any items on the stairs. Make sure that there are handrails on both sides of the stairs and use them. Fix handrails that are broken or loose. Make sure that handrails are as long as the stairways. Check any carpeting to make sure that it is firmly attached to the stairs. Fix any carpet that is loose or worn.  Avoid having throw rugs at the top or bottom of the stairs. If you do have throw rugs, attach them to the floor with carpet tape. Make sure that you have a light switch at the top of the stairs and the bottom of the stairs. If you do not have them, ask someone to add them for you. What else can I do to help prevent falls? Wear shoes that: Do not have high heels. Have rubber bottoms. Are comfortable and fit you well. Are closed at the toe. Do not wear sandals. If you use a stepladder: Make sure that it is fully opened. Do not climb a closed stepladder. Make sure that both sides of the stepladder are locked into place. Ask someone to hold it for you, if possible. Clearly  mark and make sure that you can see: Any grab bars or handrails. First and last steps. Where the edge of each step is. Use tools that help you move around (mobility aids) if they are needed. These include: Canes. Walkers. Scooters. Crutches. Turn on the lights when you go into a dark area. Replace any light bulbs as soon as they burn out. Set up your furniture so you have a clear path. Avoid moving your furniture around. If any of your floors are uneven, fix them. If there are any pets around you, be aware of where they are. Review your medicines with your doctor. Some medicines can make you feel dizzy. This can increase your chance of falling. Ask your doctor what other things that you can do to help prevent falls. This information is not intended to replace advice given to you by your health care provider. Make sure you discuss any questions you have with your health care provider. Document Released: 01/20/2009 Document Revised: 09/01/2015 Document Reviewed: 04/30/2014 Elsevier Interactive Patient Education  2017 [employer].

## 2021-12-22 ENCOUNTER — Other Ambulatory Visit: Payer: Self-pay

## 2021-12-27 ENCOUNTER — Other Ambulatory Visit: Payer: Self-pay | Admitting: Oncology

## 2021-12-27 DIAGNOSIS — C680 Malignant neoplasm of urethra: Secondary | ICD-10-CM

## 2022-01-20 ENCOUNTER — Other Ambulatory Visit: Payer: Self-pay | Admitting: Oncology

## 2022-01-20 DIAGNOSIS — C680 Malignant neoplasm of urethra: Secondary | ICD-10-CM

## 2022-01-22 ENCOUNTER — Encounter: Payer: Self-pay | Admitting: Oncology

## 2022-02-14 ENCOUNTER — Inpatient Hospital Stay: Payer: Medicare Other | Attending: Oncology

## 2022-02-14 DIAGNOSIS — C679 Malignant neoplasm of bladder, unspecified: Secondary | ICD-10-CM | POA: Insufficient documentation

## 2022-02-14 DIAGNOSIS — E876 Hypokalemia: Secondary | ICD-10-CM | POA: Diagnosis not present

## 2022-02-14 DIAGNOSIS — Z95828 Presence of other vascular implants and grafts: Secondary | ICD-10-CM

## 2022-02-14 DIAGNOSIS — C609 Malignant neoplasm of penis, unspecified: Secondary | ICD-10-CM

## 2022-02-14 DIAGNOSIS — C680 Malignant neoplasm of urethra: Secondary | ICD-10-CM

## 2022-02-14 DIAGNOSIS — E032 Hypothyroidism due to medicaments and other exogenous substances: Secondary | ICD-10-CM

## 2022-02-14 DIAGNOSIS — Z79899 Other long term (current) drug therapy: Secondary | ICD-10-CM | POA: Insufficient documentation

## 2022-02-14 LAB — CBC WITH DIFFERENTIAL (CANCER CENTER ONLY)
Abs Immature Granulocytes: 0.01 10*3/uL (ref 0.00–0.07)
Basophils Absolute: 0 10*3/uL (ref 0.0–0.1)
Basophils Relative: 0 %
Eosinophils Absolute: 0.3 10*3/uL (ref 0.0–0.5)
Eosinophils Relative: 6 %
HCT: 40.2 % (ref 39.0–52.0)
Hemoglobin: 13.6 g/dL (ref 13.0–17.0)
Immature Granulocytes: 0 %
Lymphocytes Relative: 35 %
Lymphs Abs: 1.8 10*3/uL (ref 0.7–4.0)
MCH: 29.5 pg (ref 26.0–34.0)
MCHC: 33.8 g/dL (ref 30.0–36.0)
MCV: 87.2 fL (ref 80.0–100.0)
Monocytes Absolute: 0.4 10*3/uL (ref 0.1–1.0)
Monocytes Relative: 8 %
Neutro Abs: 2.7 10*3/uL (ref 1.7–7.7)
Neutrophils Relative %: 51 %
Platelet Count: 201 10*3/uL (ref 150–400)
RBC: 4.61 MIL/uL (ref 4.22–5.81)
RDW: 13.6 % (ref 11.5–15.5)
WBC Count: 5.2 10*3/uL (ref 4.0–10.5)
nRBC: 0 % (ref 0.0–0.2)

## 2022-02-14 LAB — CMP (CANCER CENTER ONLY)
ALT: 10 U/L (ref 0–44)
AST: 14 U/L — ABNORMAL LOW (ref 15–41)
Albumin: 4.2 g/dL (ref 3.5–5.0)
Alkaline Phosphatase: 92 U/L (ref 38–126)
Anion gap: 7 (ref 5–15)
BUN: 20 mg/dL (ref 8–23)
CO2: 30 mmol/L (ref 22–32)
Calcium: 9.5 mg/dL (ref 8.9–10.3)
Chloride: 103 mmol/L (ref 98–111)
Creatinine: 1.23 mg/dL (ref 0.61–1.24)
GFR, Estimated: >60 mL/min (ref >60)
Glucose, Bld: 114 mg/dL — ABNORMAL HIGH (ref 70–99)
Potassium: 3.2 mmol/L — ABNORMAL LOW (ref 3.5–5.1)
Sodium: 140 mmol/L (ref 135–145)
Total Bilirubin: 0.7 mg/dL (ref 0.3–1.2)
Total Protein: 7.4 g/dL (ref 6.5–8.1)

## 2022-02-14 LAB — TSH: TSH: 1.505 u[IU]/mL (ref 0.350–4.500)

## 2022-02-14 MED ORDER — HEPARIN SOD (PORK) LOCK FLUSH 100 UNIT/ML IV SOLN
500.0000 [IU] | Freq: Once | INTRAVENOUS | Status: AC
  Administered 2022-02-14: 500 [IU]

## 2022-02-14 MED ORDER — SODIUM CHLORIDE 0.9% FLUSH
10.0000 mL | Freq: Once | INTRAVENOUS | Status: AC
  Administered 2022-02-14: 10 mL

## 2022-02-15 ENCOUNTER — Other Ambulatory Visit: Payer: Medicare Other

## 2022-03-15 ENCOUNTER — Encounter: Payer: Self-pay | Admitting: Internal Medicine

## 2022-03-15 ENCOUNTER — Ambulatory Visit (INDEPENDENT_AMBULATORY_CARE_PROVIDER_SITE_OTHER): Payer: Medicare Other | Admitting: Internal Medicine

## 2022-03-15 VITALS — BP 134/82 | HR 54 | Temp 98.1°F | Ht 69.0 in | Wt 206.0 lb

## 2022-03-15 DIAGNOSIS — I10 Essential (primary) hypertension: Secondary | ICD-10-CM

## 2022-03-15 DIAGNOSIS — E876 Hypokalemia: Secondary | ICD-10-CM | POA: Insufficient documentation

## 2022-03-15 DIAGNOSIS — R001 Bradycardia, unspecified: Secondary | ICD-10-CM

## 2022-03-15 DIAGNOSIS — N1831 Chronic kidney disease, stage 3a: Secondary | ICD-10-CM | POA: Insufficient documentation

## 2022-03-15 LAB — BASIC METABOLIC PANEL
BUN: 20 mg/dL (ref 6–23)
CO2: 30 mEq/L (ref 19–32)
Calcium: 9.9 mg/dL (ref 8.4–10.5)
Chloride: 100 mEq/L (ref 96–112)
Creatinine, Ser: 1.32 mg/dL (ref 0.40–1.50)
GFR: 53.81 mL/min — ABNORMAL LOW (ref >60.00)
Glucose, Bld: 102 mg/dL — ABNORMAL HIGH (ref 70–99)
Potassium: 3.2 mEq/L — ABNORMAL LOW (ref 3.5–5.1)
Sodium: 138 mEq/L (ref 135–145)

## 2022-03-15 LAB — MAGNESIUM: Magnesium: 1.8 mg/dL (ref 1.5–2.5)

## 2022-03-15 MED ORDER — POTASSIUM CHLORIDE CRYS ER 20 MEQ PO TBCR: 20.0000 meq | EXTENDED_RELEASE_TABLET | Freq: Three times a day (TID) | ORAL | 1 refills | Status: DC

## 2022-03-15 MED ORDER — METOPROLOL SUCCINATE ER 50 MG PO TB24: 50.0000 mg | ORAL_TABLET | Freq: Every day | ORAL | 1 refills | Status: DC

## 2022-03-15 NOTE — Patient Instructions (Signed)
Bradycardia, Adult Bradycardia is a slower-than-normal heartbeat. A normal resting heart rate for an adult ranges from 60 to 100 beats per minute. With bradycardia, the resting heart rate is less than 60 beats per minute. Bradycardia can prevent enough oxygen from reaching certain areas of your body when you are active. It can be serious if it keeps enough oxygen from reaching your brain and other parts of your body. Bradycardia is not a problem for everyone. For some healthy adults, a slow resting heart rate is normal. What are the causes? This condition may be caused by: A problem with the heart, including: A problem with the heart's electrical system, such as a heart block. With a heart block, electrical signals between the chambers of the heart are partially or completely blocked, so they are not able to work as they should. A problem with the heart's natural pacemaker (sinus node). Heart disease. A heart attack. Heart damage. Lyme disease. A heart infection. A heart condition that is present at birth (congenital heart defect). Certain medicines that treat heart conditions. Certain conditions, such as hypothyroidism and obstructive sleep apnea. Problems with the balance of chemicals and other substances, like potassium, in the blood. Trauma. Radiation therapy. What increases the risk? You are more likely to develop this condition if you: Are age 65 or older. Have high blood pressure (hypertension), high cholesterol (hyperlipidemia), or diabetes. Drink heavily, use tobacco or nicotine products, or use drugs. What are the signs or symptoms? Symptoms of this condition include: Light-headedness. Feeling faint or fainting. Fatigue and weakness. Trouble with activity or exercise. Shortness of breath. Chest pain (angina). Drowsiness. Confusion. Dizziness. How is this diagnosed? This condition may be diagnosed based on: Your symptoms. Your medical history. A physical exam. During  the exam, your health care provider will listen to your heartbeat and check your pulse. To confirm the diagnosis, your health care provider may order tests, such as: Blood tests. An electrocardiogram (ECG). This test records the heart's electrical activity. The test can show how fast your heart is beating and whether the heartbeat is steady. A test in which you wear a portable device (event recorder or Holter monitor) to record your heart's electrical activity while you go about your day. An exercise test. How is this treated? Treatment for this condition depends on the cause of the condition and how severe your symptoms are. Treatment may involve: Treatment of the underlying condition. Changing your medicines or how much medicine you take. Having a small, battery-operated device called a pacemaker implanted under the skin. When bradycardia occurs, this device can be used to increase your heart rate and help your heart beat in a regular rhythm. Follow these instructions at home: Lifestyle Manage any health conditions that contribute to bradycardia as told by your health care provider. Follow a heart-healthy diet. A nutrition specialist (dietitian) can help educate you about healthy food options and changes. Follow an exercise program that is approved by your health care provider. Maintain a healthy weight. Try to reduce or manage your stress, such as with yoga or meditation. If you need help reducing stress, ask your health care provider. Do not use any products that contain nicotine or tobacco. These products include cigarettes, chewing tobacco, and vaping devices, such as e-cigarettes. If you need help quitting, ask your health care provider. Do not use illegal drugs. Alcohol use If you drink alcohol: Limit how much you have to: 0-1 drink a day for women who are not pregnant. 0-2 drinks a day   for men. Know how much alcohol is in a drink. In the [place] one drink equals one 12 oz bottle of  beer (355 mL), one 5 oz glass of wine (148 mL), or one 1 oz glass of hard liquor (44 mL). General instructions Take over-the-counter and prescription medicines only as told by your health care provider. Keep all follow-up visits. This is important. How is this prevented? In some cases, bradycardia may be prevented by: Treating underlying medical problems. Stopping behaviors or medicines that can trigger the condition. Contact a health care provider if: You feel light-headed or dizzy. You almost faint. You feel weak or are easily fatigued during physical activity. You experience confusion or have memory problems. Get help right away if: You faint. You have chest pains or an irregular heartbeat (palpitations). You have trouble breathing. These symptoms may represent a serious problem that is an emergency. Do not wait to see if the symptoms will go away. Get medical help right away. Call your local emergency services (911 in the [place] Do not drive yourself to the hospital. Summary Bradycardia is a slower-than-normal heartbeat. With bradycardia, the resting heart rate is less than 60 beats per minute. Treatment for this condition depends on the cause. Manage any health conditions that contribute to bradycardia as told by your health care provider. Do not use any products that contain nicotine or tobacco. These products include cigarettes, chewing tobacco, and vaping devices, such as e-cigarettes. Keep all follow-up visits. This is important. This information is not intended to replace advice given to you by your health care provider. Make sure you discuss any questions you have with your health care provider. Document Revised: 07/17/2020 Document Reviewed: 07/17/2020 Elsevier Patient Education  2023 [employer]  

## 2022-03-15 NOTE — Progress Notes (Signed)
Subjective:  Patient ID: Mario Hubbard, male    DOB: [birth year]  Age: 71 y.o. MRN: [id number]  CC: Hypertension   HPI Mario Hubbard presents for f/up -  He is active and denies chest pain, shortness of breath, diaphoresis, edema, palpitations, dizziness, lightheadedness, or presyncope.  Outpatient Medications Prior to Visit  Medication Sig Dispense Refill   amLODipine (NORVASC) 10 MG tablet Take 1 tablet (10 mg total) by mouth daily. 90 tablet 3   atorvastatin (LIPITOR) 20 MG tablet Take 1 tablet (20 mg total) by mouth daily. 90 tablet 3   indapamide (LOZOL) 2.5 MG tablet Take 1 tablet (2.5 mg total) by mouth daily. 90 tablet 3   losartan (COZAAR) 100 MG tablet Take 1 tablet (100 mg total) by mouth daily. 90 tablet 3   sildenafil (VIAGRA) 50 MG tablet Take 1 tablet (50 mg total) by mouth daily as needed for erectile dysfunction. 10 tablet 11   KLOR-CON M20 20 MEQ tablet TAKE 1 TABLET BY MOUTH EVERY DAY 30 tablet 1   KLOR-CON M20 20 MEQ tablet TAKE 1 TABLET BY MOUTH EVERY DAY 90 tablet 1   metoprolol tartrate (LOPRESSOR) 50 MG tablet Take 1 tablet (50 mg total) by mouth 2 (two) times daily. 180 tablet 3   No facility-administered medications prior to visit.    ROS Review of Systems  Constitutional: Negative.  Negative for diaphoresis, fatigue and unexpected weight change.  HENT: Negative.    Eyes: Negative.   Respiratory:  Negative for cough, chest tightness and shortness of breath.   Cardiovascular:  Negative for chest pain, palpitations and leg swelling.  Gastrointestinal:  Negative for abdominal pain, diarrhea, nausea and vomiting.  Endocrine: Negative.   Genitourinary: Negative.  Negative for difficulty urinating.  Musculoskeletal: Negative.  Negative for arthralgias and myalgias.  Skin: Negative.   Neurological: Negative.  Negative for dizziness, weakness and light-headedness.  Hematological:  Negative for adenopathy. Does not bruise/bleed easily.  Psychiatric/Behavioral:  Negative.      Objective:  BP 134/82 (BP Location: Left Arm, Patient Position: Sitting, Cuff Size: Large)   Pulse (!) 54   Temp 98.1 F (36.7 C) (Oral)   Ht '5\' 9"'$  (1.753 m)   Wt 206 lb (93.4 kg)   SpO2 95%   BMI 30.42 kg/m   BP Readings from Last 3 Encounters:  03/15/22 134/82  10/12/21 (!) 155/73  09/20/21 114/61    Wt Readings from Last 3 Encounters:  03/15/22 206 lb (93.4 kg)  12/21/21 203 lb (92.1 kg)  10/12/21 204 lb (92.5 kg)    Physical Exam Vitals reviewed.  HENT:     Mouth/Throat:     Mouth: Mucous membranes are moist.  Eyes:     General: No scleral icterus.    Conjunctiva/sclera: Conjunctivae normal.  Cardiovascular:     Rate and Rhythm: Regular rhythm. Bradycardia present.     Heart sounds: Normal heart sounds, S1 normal and S2 normal. No murmur heard.    Comments: EKG- SB, 49 bpm No LVH or Q waves NS T wave changes Pulmonary:     Effort: Pulmonary effort is normal.     Breath sounds: No stridor. No wheezing, rhonchi or rales.  Abdominal:     General: Abdomen is flat.     Palpations: There is no mass.     Tenderness: There is no abdominal tenderness. There is no guarding.     Hernia: No hernia is present.  Musculoskeletal:        General: Normal range  of motion.     Cervical back: Neck supple.     Right lower leg: No edema.     Left lower leg: No edema.  Lymphadenopathy:     Cervical: No cervical adenopathy.  Skin:    General: Skin is warm and dry.  Neurological:     General: No focal deficit present.     Mental Status: He is alert.  Psychiatric:        Mood and Affect: Mood normal.        Behavior: Behavior normal.     Lab Results  Component Value Date   WBC 5.2 02/14/2022   HGB 13.6 02/14/2022   HCT 40.2 02/14/2022   PLT 201 02/14/2022   GLUCOSE 102 (H) 03/15/2022   CHOL 172 09/13/2021   TRIG 99.0 09/13/2021   HDL 40.20 09/13/2021   LDLCALC 112 (H) 09/13/2021   ALT 10 02/14/2022   AST 14 (L) 02/14/2022   NA 138 03/15/2022    K 3.2 (L) 03/15/2022   CL 100 03/15/2022   CREATININE 1.32 03/15/2022   BUN 20 03/15/2022   CO2 30 03/15/2022   TSH 1.505 02/14/2022   PSA 2.68 09/13/2021   HGBA1C 5.9 09/13/2021    CT ABDOMEN PELVIS WO CONTRAST  Result Date: 09/18/2021 CLINICAL DATA:  Restaging urethral carcinoma EXAM: CT CHEST, ABDOMEN AND PELVIS WITHOUT CONTRAST TECHNIQUE: Multidetector CT imaging of the chest, abdomen and pelvis was performed following the standard protocol without IV contrast. RADIATION DOSE REDUCTION: This exam was performed according to the departmental dose-optimization program which includes automated exposure control, adjustment of the mA and/or kV according to patient size and/or use of iterative reconstruction technique. COMPARISON:  03/09/2021. FINDINGS: CT CHEST FINDINGS Cardiovascular: The heart size is normal. Aortic atherosclerosis and coronary artery calcifications. No pericardial effusion. Mediastinum/Nodes: Unchanged appearance right lobe of thyroid gland nodule which appears hypodense measuring 2.5 cm, image 6/2. Recommend thyroid US. (Ref: J Am Coll Radiol. 2015 Feb;12(2): 143-50).No enlarged axillary, supraclavicular, or mediastinal lymph nodes. The hilar lymph nodes are suboptimally evaluated due to lack of IV contrast. Lungs/Pleura: No pleural effusion, airspace consolidation, atelectasis, or pneumothorax. Paraseptal and centrilobular emphysema. Interstitial reticulation and architectural distortion within the upper lobes are identified, likely reflecting postinflammatory change. This is unchanged from the previous exam. No suspicious lung nodules. Musculoskeletal: No chest wall mass or suspicious bone lesions identified. 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 CT ABDOMEN PELVIS FINDINGS Hepatobiliary: No focal liver abnormality is seen. No gallstones, gallbladder wall thickening, or biliary dilatation. Pancreas:  Unremarkable. No pancreatic ductal dilatation or surrounding inflammatory changes. Spleen: Normal in size without focal abnormality. Adrenals/Urinary Tract: Normal adrenal glands. Benign Bosniak class 1 cyst arises off the upper pole of left kidney measuring 2.9 cm, image 70/2. No follow-up recommended. No signs of nephrolithiasis or hydronephrosis. The ureters appear normal in caliber. Urinary bladder is unremarkable. Stomach/Bowel: Normal appearance of the stomach. The appendix is visualized and appears normal. No signs bowel wall thickening, inflammation, or distension. Diverticula noted within the distal descending colon without signs of acute diverticulitis. Vascular/Lymphatic: Aortic atherosclerosis. No signs of abdominopelvic adenopathy. Dissection clips noted within the left inguinal region. No inguinal adenopathy identified. Reproductive: Prostate is unremarkable. Other: No free fluid or fluid collection within the abdomen or pelvis. Musculoskeletal: No acute or significant osseous findings. Lumbar spondylosis. IMPRESSION: 1. Stable exam. No evidence for recurrent tumor or metastatic disease. 2. Stable appearance of right lobe of thyroid gland nodule. Recommend  thyroid US (ref: J Am Coll Radiol. 2015 Feb;12(2): 143-50). 3. Aortic Atherosclerosis (ICD10-I70.0) and Emphysema (ICD10-J43.9). Electronically Signed   By: Kerby Moors M.D.   On: 09/18/2021 15:08   CT Chest Wo Contrast  Result Date: 09/18/2021 CLINICAL DATA:  Restaging urethral carcinoma EXAM: CT CHEST, ABDOMEN AND PELVIS WITHOUT CONTRAST TECHNIQUE: Multidetector CT imaging of the chest, abdomen and pelvis was performed following the standard protocol without IV contrast. RADIATION DOSE REDUCTION: This exam was performed according to the departmental dose-optimization program which includes automated exposure control, adjustment of the mA and/or kV according to patient size and/or use of iterative reconstruction technique. COMPARISON:   03/09/2021. FINDINGS: CT CHEST FINDINGS Cardiovascular: The heart size is normal. Aortic atherosclerosis and coronary artery calcifications. No pericardial effusion. Mediastinum/Nodes: Unchanged appearance right lobe of thyroid gland nodule which appears hypodense measuring 2.5 cm, image 6/2. Recommend thyroid US. (Ref: J Am Coll Radiol. 2015 Feb;12(2): 143-50).No enlarged axillary, supraclavicular, or mediastinal lymph nodes. The hilar lymph nodes are suboptimally evaluated due to lack of IV contrast. Lungs/Pleura: No pleural effusion, airspace consolidation, atelectasis, or pneumothorax. Paraseptal and centrilobular emphysema. Interstitial reticulation and architectural distortion within the upper lobes are identified, likely reflecting postinflammatory change. This is unchanged from the previous exam. No suspicious lung nodules. Musculoskeletal: No chest wall mass or suspicious bone lesions identified. 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 CT ABDOMEN PELVIS FINDINGS Hepatobiliary: No focal liver abnormality is seen. No gallstones, gallbladder wall thickening, or biliary dilatation. Pancreas: Unremarkable. No pancreatic ductal dilatation or surrounding inflammatory changes. Spleen: Normal in size without focal abnormality. Adrenals/Urinary Tract: Normal adrenal glands. Benign Bosniak class 1 cyst arises off the upper pole of left kidney measuring 2.9 cm, image 70/2. No follow-up recommended. No signs of nephrolithiasis or hydronephrosis. The ureters appear normal in caliber. Urinary bladder is unremarkable. Stomach/Bowel: Normal appearance of the stomach. The appendix is visualized and appears normal. No signs bowel wall thickening, inflammation, or distension. Diverticula noted within the distal descending colon without signs of acute diverticulitis. Vascular/Lymphatic: Aortic atherosclerosis. No signs of abdominopelvic adenopathy.  Dissection clips noted within the left inguinal region. No inguinal adenopathy identified. Reproductive: Prostate is unremarkable. Other: No free fluid or fluid collection within the abdomen or pelvis. Musculoskeletal: No acute or significant osseous findings. Lumbar spondylosis. IMPRESSION: 1. Stable exam. No evidence for recurrent tumor or metastatic disease. 2. Stable appearance of right lobe of thyroid gland nodule. Recommend thyroid US (ref: J Am Coll Radiol. 2015 Feb;12(2): 143-50). 3. Aortic Atherosclerosis (ICD10-I70.0) and Emphysema (ICD10-J43.9). Electronically Signed   By: Kerby Moors M.D.   On: 09/18/2021 15:08    Assessment & Plan:   Jaxton was seen today for hypertension.  Diagnoses and all orders for this visit:  Bradycardia- Will decrease the beta-blocker dose by 50%. -     EKG 12-Lead  Primary hypertension- His blood pressure is well-controlled but his heart rate is 49.  Will decrease the beta-blocker dose -     Basic metabolic panel; Future -     Magnesium; Future -     EKG 12-Lead -     Magnesium -     Basic metabolic panel -     potassium chloride SA (KLOR-CON M20) 20 MEQ tablet; Take 1 tablet (20 mEq total) by mouth 3 (three) times daily. -     metoprolol succinate (TOPROL-XL) 50 MG 24 hr tablet; Take 1 tablet (50 mg total) by mouth daily. Take with or immediately following  a meal.  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  Will triple the dose of the potassium supplement. -     Basic metabolic panel; Future -     Magnesium; Future -     Magnesium -     Basic metabolic panel -     potassium chloride SA (KLOR-CON M20) 20 MEQ tablet; Take 1 tablet (20 mEq total) by mouth 3 (three) times daily.  Stage 3a chronic kidney disease ([place])- BP is well-controlled.  Will avoid nephrotoxic agents.   I have discontinued Kiegan Dearden's metoprolol tartrate and Klor-Con M20. I have also changed his Klor-Con M20 to potassium chloride SA. Additionally, I am having him start  on metoprolol succinate. Lastly, I am having him maintain his amLODipine, indapamide, losartan, sildenafil, and atorvastatin.  Meds ordered this encounter  Medications   potassium chloride SA (KLOR-CON M20) 20 MEQ tablet    Sig: Take 1 tablet (20 mEq total) by mouth 3 (three) times daily.    Dispense:  270 tablet    Refill:  1   metoprolol succinate (TOPROL-XL) 50 MG 24 hr tablet    Sig: Take 1 tablet (50 mg total) by mouth daily. Take with or immediately following a meal.    Dispense:  90 tablet    Refill:  1     Follow-up: Return in about 6 months (around 09/14/2022).  Scarlette Calico, MD

## 2022-04-05 ENCOUNTER — Inpatient Hospital Stay: Payer: Medicare Other | Attending: Oncology

## 2022-04-05 DIAGNOSIS — C609 Malignant neoplasm of penis, unspecified: Secondary | ICD-10-CM

## 2022-04-05 DIAGNOSIS — Z95828 Presence of other vascular implants and grafts: Secondary | ICD-10-CM

## 2022-04-05 DIAGNOSIS — C680 Malignant neoplasm of urethra: Secondary | ICD-10-CM

## 2022-04-05 DIAGNOSIS — E876 Hypokalemia: Secondary | ICD-10-CM | POA: Insufficient documentation

## 2022-04-05 DIAGNOSIS — Z79899 Other long term (current) drug therapy: Secondary | ICD-10-CM | POA: Diagnosis not present

## 2022-04-05 DIAGNOSIS — C679 Malignant neoplasm of bladder, unspecified: Secondary | ICD-10-CM | POA: Diagnosis not present

## 2022-04-05 DIAGNOSIS — E032 Hypothyroidism due to medicaments and other exogenous substances: Secondary | ICD-10-CM

## 2022-04-05 LAB — CBC WITH DIFFERENTIAL (CANCER CENTER ONLY)
Abs Immature Granulocytes: 0.01 10*3/uL (ref 0.00–0.07)
Basophils Absolute: 0 10*3/uL (ref 0.0–0.1)
Basophils Relative: 1 %
Eosinophils Absolute: 0.4 10*3/uL (ref 0.0–0.5)
Eosinophils Relative: 7 %
HCT: 39 % (ref 39.0–52.0)
Hemoglobin: 13.1 g/dL (ref 13.0–17.0)
Immature Granulocytes: 0 %
Lymphocytes Relative: 29 %
Lymphs Abs: 1.7 10*3/uL (ref 0.7–4.0)
MCH: 28.9 pg (ref 26.0–34.0)
MCHC: 33.6 g/dL (ref 30.0–36.0)
MCV: 86.1 fL (ref 80.0–100.0)
Monocytes Absolute: 0.6 10*3/uL (ref 0.1–1.0)
Monocytes Relative: 10 %
Neutro Abs: 3.2 10*3/uL (ref 1.7–7.7)
Neutrophils Relative %: 53 %
Platelet Count: 188 10*3/uL (ref 150–400)
RBC: 4.53 MIL/uL (ref 4.22–5.81)
RDW: 13.8 % (ref 11.5–15.5)
WBC Count: 5.9 10*3/uL (ref 4.0–10.5)
nRBC: 0 % (ref 0.0–0.2)

## 2022-04-05 LAB — CMP (CANCER CENTER ONLY)
ALT: 11 U/L (ref 0–44)
AST: 14 U/L — ABNORMAL LOW (ref 15–41)
Albumin: 4.1 g/dL (ref 3.5–5.0)
Alkaline Phosphatase: 89 U/L (ref 38–126)
Anion gap: 4 — ABNORMAL LOW (ref 5–15)
BUN: 24 mg/dL — ABNORMAL HIGH (ref 8–23)
CO2: 31 mmol/L (ref 22–32)
Calcium: 9.9 mg/dL (ref 8.9–10.3)
Chloride: 104 mmol/L (ref 98–111)
Creatinine: 1.11 mg/dL (ref 0.61–1.24)
GFR, Estimated: >60 mL/min (ref >60)
Glucose, Bld: 122 mg/dL — ABNORMAL HIGH (ref 70–99)
Potassium: 3.4 mmol/L — ABNORMAL LOW (ref 3.5–5.1)
Sodium: 139 mmol/L (ref 135–145)
Total Bilirubin: 0.7 mg/dL (ref 0.3–1.2)
Total Protein: 7.3 g/dL (ref 6.5–8.1)

## 2022-04-05 LAB — TSH: TSH: 1.327 u[IU]/mL (ref 0.350–4.500)

## 2022-04-05 MED ORDER — SODIUM CHLORIDE 0.9% FLUSH
10.0000 mL | Freq: Once | INTRAVENOUS | Status: AC
  Administered 2022-04-05: 10 mL

## 2022-04-05 MED ORDER — HEPARIN SOD (PORK) LOCK FLUSH 100 UNIT/ML IV SOLN
500.0000 [IU] | Freq: Once | INTRAVENOUS | Status: AC
  Administered 2022-04-05: 500 [IU]

## 2022-04-06 ENCOUNTER — Ambulatory Visit (HOSPITAL_COMMUNITY)
Admission: RE | Admit: 2022-04-06 | Discharge: 2022-04-06 | Disposition: A | Discharge status: Home / Self Care | Payer: Medicare Other | Source: Ambulatory Visit | Attending: Oncology | Admitting: Oncology

## 2022-04-06 ENCOUNTER — Encounter (HOSPITAL_COMMUNITY): Payer: Self-pay

## 2022-04-06 DIAGNOSIS — C680 Malignant neoplasm of urethra: Secondary | ICD-10-CM | POA: Diagnosis present

## 2022-04-06 MED ORDER — IOHEXOL 9 MG/ML PO SOLN: 1000.0000 mL | ORAL | Status: DC

## 2022-04-06 MED ORDER — IOHEXOL 9 MG/ML PO SOLN
ORAL | Status: AC
  Filled 2022-04-06: qty 1000

## 2022-04-12 ENCOUNTER — Inpatient Hospital Stay: Payer: Medicare Other | Attending: Oncology | Admitting: Oncology

## 2022-04-12 ENCOUNTER — Other Ambulatory Visit: Payer: Self-pay

## 2022-04-12 VITALS — BP 146/67 | HR 60 | Temp 97.8°F | Resp 18 | Ht 69.0 in | Wt 204.1 lb

## 2022-04-12 DIAGNOSIS — C680 Malignant neoplasm of urethra: Secondary | ICD-10-CM | POA: Diagnosis present

## 2022-04-12 DIAGNOSIS — Z923 Personal history of irradiation: Secondary | ICD-10-CM | POA: Diagnosis not present

## 2022-04-12 DIAGNOSIS — C609 Malignant neoplasm of penis, unspecified: Secondary | ICD-10-CM | POA: Diagnosis not present

## 2022-04-12 NOTE — Progress Notes (Signed)
Hematology and Oncology Follow Up Visit  Mario Hubbard [id number] 04-14-1950 72 y.o. 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   He achieved a complete response to immunotherapy.he initially presented with localized disease in 2015.   Prior Therapy:  He underwent surgical resection followed by radiation therapy in 2015.  He subsequently developed metastatic disease with right-sided pelvic lymph node and confirmed the presence of relapsed disease that is PET avid in 2017.   He was treated with carboplatin and Taxol initially with excellent response after 3 cycles. 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   He did show worsening disease in October 2018 and subsequently was started on Tecentriq. He relocated to [place] and received Tecentriq maintenance therapy with excellent response. CT scan in August 2021 continues to show no evidence of disease.  Tecentriq 1200 mg every 3 weeks started in 2018 while living in [place].  He is receiving it locally started on February 09, 2020.  Last cycle was given on October 12, 2021.   Current therapy: Active surveillance.  Interim History: Mr. [place] returns today for a follow-up.  Since the last visit, he reports feeling well without any major complaints.  He received his last cycle of treatment in July without any residual issues.  He denies any nausea, vomiting or abdominal pain.  He denies any pruritus.  Medications: Reviewed without changes. Current Outpatient Medications  Medication Sig Dispense Refill   amLODipine (NORVASC) 10 MG tablet Take 1 tablet (10 mg total) by mouth daily. 90 tablet 3   atorvastatin (LIPITOR) 20 MG tablet Take 1 tablet (20 mg total) by mouth daily. 90 tablet 3   indapamide (LOZOL) 2.5 MG tablet Take 1  tablet (2.5 mg total) by mouth daily. 90 tablet 3   losartan (COZAAR) 100 MG tablet Take 1 tablet (100 mg total) by mouth daily. 90 tablet 3   metoprolol succinate (TOPROL-XL) 50 MG 24 hr tablet Take 1 tablet (50 mg total) by mouth daily. Take with or immediately following a meal. 90 tablet 1   potassium chloride SA (KLOR-CON M20) 20 MEQ tablet Take 1 tablet (20 mEq total) by mouth 3 (three) times daily. 270 tablet 1   sildenafil (VIAGRA) 50 MG tablet Take 1 tablet (50 mg total) by mouth daily as needed for erectile dysfunction. 10 tablet 11   No current facility-administered medications for this visit.     Allergies: No Known Allergies    Physical Exam:      Blood pressure (!) 146/67, pulse 60, temperature 97.8 F (36.6 C), temperature source Temporal, resp. rate 18, height '5\' 9"'$  (1.753 m), weight 204 lb 1.6 oz (92.6 kg), SpO2 100 %.       ECOG: 0   General appearance: Comfortable appearing without any discomfort Head: Normocephalic without any trauma Oropharynx: Mucous membranes are moist and pink without any thrush or ulcers. Eyes: Pupils are equal and round reactive to light. Lymph nodes: No cervical, supraclavicular, inguinal or axillary lymphadenopathy.   Heart:regular rate and rhythm.  S1 and S2 without leg edema. Lung: Clear without any rhonchi or wheezes.  No dullness to percussion. Abdomin: Soft, nontender, nondistended with good bowel sounds.  No hepatosplenomegaly. Musculoskeletal: No joint deformity or effusion.  Full range of motion noted. Neurological: No deficits noted on motor, sensory and deep tendon reflex exam.  Skin: No petechial rash or dryness.  Appeared moist.                         Lab Results: Lab Results  Component Value Date   WBC 5.9 04/05/2022   HGB 13.1 04/05/2022   HCT 39.0 04/05/2022   MCV 86.1 04/05/2022   PLT 188 04/05/2022   PSA 2.68 09/13/2021     Chemistry      Component Value Date/Time   NA 139  04/05/2022 1025   K 3.4 (L) 04/05/2022 1025   CL 104 04/05/2022 1025   CO2 31 04/05/2022 1025   BUN 24 (H) 04/05/2022 1025   CREATININE 1.11 04/05/2022 1025      Component Value Date/Time   CALCIUM 9.9 04/05/2022 1025   ALKPHOS 89 04/05/2022 1025   AST 14 (L) 04/05/2022 1025   ALT 11 04/05/2022 1025   BILITOT 0.7 04/05/2022 1025     IMPRESSION: 1. No noncontrast evidence of recurrent or metastatic disease in the chest, abdomen, or pelvis. 2. Emphysema. 3. Coronary artery disease. 4. Prostatomegaly.   Impression and Plan:  72 year old with:   1.  Cancer of the urethra diagnosed in 2015.  He developed stage IV high-grade urothelial carcinoma with pelvic adenopathy documented in 2017.  He achieved a complete response to immunotherapy.  The natural course of this disease was reviewed at this time and treatment options were discussed.  CT scan obtained on May 07, 2021 continues to show complete response.  He has received more than 5 years of immunotherapy for consolidation.  Risks and benefits of continued active surveillance versus active treatment were discussed.  At this time, I recommended continued active surveillance with repeat imaging studies every 6 months.  This can be increased to annually at a later date.  Salvage therapy including restarting immunotherapy versus antibody drug conjugate (Padcev) or combination of the above.    2. IV access: Port-A-Cath remains in place and will be flushed periodically.     3.  Immune mediated complications.  Complications noted at this time.  Pneumonitis, colitis and thyroid disease are all potential issues which she has not experienced any.    4. Follow-up: He will return in 2 months and in 4 months for Port-A-Cath flush.  He will return in 6 months for updating his staging scans.  30  minutes were spent on this visit.  The time was dedicated to reviewing his disease status, treatment choices and outlining future plan of care  reviewed. Zola Button, MD 1/4/20248:46 AM

## 2022-05-31 ENCOUNTER — Encounter: Payer: Self-pay | Admitting: Hematology and Oncology

## 2022-06-11 ENCOUNTER — Inpatient Hospital Stay: Payer: Medicare Other | Attending: Oncology

## 2022-06-11 DIAGNOSIS — C680 Malignant neoplasm of urethra: Secondary | ICD-10-CM | POA: Diagnosis present

## 2022-06-11 DIAGNOSIS — Z95828 Presence of other vascular implants and grafts: Secondary | ICD-10-CM

## 2022-06-11 DIAGNOSIS — C609 Malignant neoplasm of penis, unspecified: Secondary | ICD-10-CM

## 2022-06-11 MED ORDER — HEPARIN SOD (PORK) LOCK FLUSH 100 UNIT/ML IV SOLN
500.0000 [IU] | Freq: Once | INTRAVENOUS | Status: AC
  Administered 2022-06-11: 500 [IU]

## 2022-06-11 MED ORDER — SODIUM CHLORIDE 0.9% FLUSH
10.0000 mL | Freq: Once | INTRAVENOUS | Status: AC
  Administered 2022-06-11: 10 mL

## 2022-06-29 ENCOUNTER — Encounter (HOSPITAL_COMMUNITY): Payer: Self-pay | Admitting: *Deleted

## 2022-06-29 ENCOUNTER — Ambulatory Visit (HOSPITAL_COMMUNITY)
Admission: EM | Admit: 2022-06-29 | Discharge: 2022-06-29 | Disposition: A | Discharge status: Home / Self Care | Payer: Medicare Other | Attending: Emergency Medicine | Admitting: Emergency Medicine

## 2022-06-29 DIAGNOSIS — J302 Other seasonal allergic rhinitis: Secondary | ICD-10-CM | POA: Diagnosis not present

## 2022-06-29 DIAGNOSIS — J329 Chronic sinusitis, unspecified: Secondary | ICD-10-CM | POA: Diagnosis not present

## 2022-06-29 MED ORDER — DESLORATADINE 5 MG PO TABS: 5.0000 mg | ORAL_TABLET | Freq: Every day | ORAL | 2 refills | Status: DC

## 2022-06-29 MED ORDER — MOMETASONE FUROATE 50 MCG/ACT NA SUSP: 2.0000 | Freq: Every day | NASAL | 2 refills | Status: DC

## 2022-06-29 NOTE — ED Triage Notes (Signed)
Pt states he started with congestion and fatigue on Monday. He did take some cough meds last night so he could sleep.

## 2022-06-29 NOTE — Discharge Instructions (Signed)
Your symptoms and physical exam findings are concerning for a viral respiratory infection such as the common cold.  Because your prior respiratory allergies appear to have worsened at this time, this can make you more susceptible to catching respiratory infections.   To avoid catching frequent respiratory infections, having skin reactions, dealing with eye irritation, losing sleep, missing work, etc., due to uncontrolled allergies, it is important that you begin/continue your allergy regimen and are consistent with taking your meds exactly as prescribed.   Please read below to learn more about the medications, dosages and frequencies that I recommend to help alleviate your symptoms and to get you feeling better soon:  Clarinex (loratadine): This is an excellent second-generation antihistamine that helps to reduce respiratory inflammatory response to environmental allergens.  This medication is not known to cause daytime sleepiness so it can be taken in the daytime.  If you find that it does make you sleepy, please feel free to take it at bedtime.   Nasonex (mometasone): This is a steroid nasal spray that used once daily, 1 spray in each nare.  This works best when used on a daily basis. This medication does not work well if it is only used when you think you need it.  After 3 to 5 days of use, you will notice significant reduction of the inflammation and mucus production that is currently being caused by exposure to allergens, whether seasonal or environmental.  The most common side effect of this medication is nosebleeds.  If you experience a nosebleed, please discontinue use for 1 week, then feel free to resume.   If you find that your insurance will not pay for this medication, please consider a different nasal steroids such as Flonase (fluticasone), or Nasacort (triamcinolone).    If symptoms have not meaningfully improved in the next 5 to 7 days, please return for repeat evaluation or follow-up with  your regular provider.  If symptoms have worsened in the next 3 to 5 days, please return for repeat evaluation or follow-up with your regular provider.    Thank you for visiting urgent care today.  We appreciate the opportunity to participate in your care.

## 2022-06-29 NOTE — ED Provider Notes (Signed)
[facility]    CSN: [id number] Arrival date & time: 06/29/22  1400    HISTORY   Chief Complaint  Patient presents with   Nasal Congestion   Fatigue   HPI Mario Hubbard is a pleasant, 72 y.o. male who presents to urgent care today. Pt states he started with congestion and fatigue on Monday. He did take some cough meds last night so he could sleep.   The history is provided by the patient.   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 No pertinent surgical history.  Home Medications    Prior to Admission medications   Medication Sig Start Date End Date Taking? Authorizing Provider  amLODipine (NORVASC) 10 MG tablet Take 1 tablet (10 mg total) by mouth daily. 09/13/21  Yes Biagio Borg, MD  atorvastatin (LIPITOR) 20 MG tablet Take 1 tablet (20 mg total) by mouth daily. 09/13/21 09/13/22 Yes Biagio Borg, MD  indapamide (LOZOL) 2.5 MG tablet Take 1 tablet (2.5 mg total) by mouth daily. 09/13/21  Yes Biagio Borg, MD  losartan (COZAAR) 100 MG tablet Take 1 tablet (100 mg total) by mouth daily. 09/13/21  Yes Biagio Borg, MD  metoprolol succinate (TOPROL-XL) 50 MG 24 hr tablet Take 1  tablet (50 mg total) by mouth daily. Take with or immediately following a meal. 03/15/22  Yes [name], MD  potassium chloride SA (KLOR-CON M20) 20 MEQ tablet Take 1 tablet (20 mEq total) by mouth 3 (three) times daily. 03/15/22  Yes [name], MD  sildenafil (VIAGRA) 50 MG tablet Take 1 tablet (50 mg total) by mouth daily as needed for erectile dysfunction. 09/13/21  Yes Biagio Borg, MD    Family History Family History  Problem Relation Age of Onset   Kidney disease Mother    Hypertension Mother    Healthy Sister    Diabetes Brother    Social History Social History   Tobacco Use   Smoking status: Former   Smokeless tobacco: Never  Substance Use Topics   Alcohol use: Never   Drug use: Never   Allergies   Patient has no known allergies.  Review of Systems Review of Systems Pertinent findings revealed after performing a 14 point review of systems has been noted in the history of present illness.  Physical Exam Vital Signs BP 122/78 (BP Location: Left Arm)   Pulse (!) 102   Temp 98.8 F (37.1 C) (Oral)   Resp 18   SpO2 96%   No data found.  Physical Exam Vitals and nursing note reviewed.  Constitutional:      General: He is not in acute distress.    Appearance: Normal appearance. He is not ill-appearing.  HENT:  Head: Normocephalic and atraumatic.     Salivary Glands: Right salivary gland is not diffusely enlarged or tender. Left salivary gland is not diffusely enlarged or tender.     Right Ear: Ear canal and external ear normal. No drainage. A middle ear effusion is present. There is no impacted cerumen. Tympanic membrane is bulging. Tympanic membrane is not injected or erythematous.     Left Ear: Ear canal and external ear normal. No drainage. A middle ear effusion is present. There is no impacted cerumen. Tympanic membrane is bulging. Tympanic membrane is not injected or erythematous.     Ears:     Comments: Bilateral EACs normal, both TMs bulging  with clear fluid    Nose: Rhinorrhea present. No nasal deformity, septal deviation, signs of injury, nasal tenderness, mucosal edema or congestion. Rhinorrhea is clear.     Right Nostril: Occlusion present. No foreign body, epistaxis or septal hematoma.     Left Nostril: Occlusion present. No foreign body, epistaxis or septal hematoma.     Right Turbinates: Enlarged, swollen and pale.     Left Turbinates: Enlarged, swollen and pale.     Right Sinus: No maxillary sinus tenderness or frontal sinus tenderness.     Left Sinus: No maxillary sinus tenderness or frontal sinus tenderness.     Mouth/Throat:     Lips: Pink. No lesions.     Mouth: Mucous membranes are moist. No oral lesions.     Pharynx: Oropharynx is clear. Uvula midline. No posterior oropharyngeal erythema or uvula swelling.     Tonsils: No tonsillar exudate. 0 on the right. 0 on the left.     Comments: Postnasal drip Eyes:     General: Lids are normal.        Right eye: No discharge.        Left eye: No discharge.     Extraocular Movements: Extraocular movements intact.     Conjunctiva/sclera: Conjunctivae normal.     Right eye: Right conjunctiva is not injected.     Left eye: Left conjunctiva is not injected.  Neck:     Trachea: Trachea and phonation normal.  Cardiovascular:     Rate and Rhythm: Normal rate and regular rhythm.     Pulses: Normal pulses.     Heart sounds: Normal heart sounds. No murmur heard.    No friction rub. No gallop.  Pulmonary:     Effort: Pulmonary effort is normal. No accessory muscle usage, prolonged expiration or respiratory distress.     Breath sounds: Normal breath sounds. No stridor, decreased air movement or transmitted upper airway sounds. No decreased breath sounds, wheezing, rhonchi or rales.  Chest:     Chest wall: No tenderness.  Musculoskeletal:        General: Normal range of motion.     Cervical back: Normal range of motion and neck supple. Normal range of motion.  Lymphadenopathy:      Cervical: No cervical adenopathy.  Skin:    General: Skin is warm and dry.     Findings: No erythema or rash.  Neurological:     General: No focal deficit present.     Mental Status: He is alert and oriented to person, place, and time.  Psychiatric:        Mood and Affect: Mood normal.        Behavior: Behavior normal.     Visual Acuity Right Eye Distance:   Left Eye Distance:   Bilateral Distance:    Right Eye  Near:   Left Eye Near:    Bilateral Near:     UC Couse / Diagnostics / Procedures:     Radiology No results found.  Procedures Procedures (including critical care time) EKG  Pending results:  Labs Reviewed - No data to display  Medications Ordered in UC: Medications - No data to display  UC Diagnoses / Final Clinical Impressions(s)   I have reviewed the triage vital signs and the nursing notes.  Pertinent labs & imaging results that were available during my care of the patient were reviewed by me and considered in my medical decision making (see chart for details).    Final diagnoses:  Rhinosinusitis  Seasonal allergies   *** Please see discharge instructions below for further details of plan of care as provided to patient. ED Prescriptions     Medication Sig Dispense Auth. Provider   desloratadine (CLARINEX) 5 MG tablet Take 1 tablet (5 mg total) by mouth daily. 30 tablet Lynden Oxford Scales, PA-C   mometasone (NASONEX) 50 MCG/ACT nasal spray Place 2 sprays into the nose daily. 1 each Lynden Oxford Scales, PA-C      PDMP not reviewed this encounter.  Disposition Upon Discharge:  Condition: stable for discharge home Home: take medications as prescribed; routine discharge instructions as discussed; follow up as advised.  Patient presented with an acute illness with associated systemic symptoms and significant discomfort requiring urgent management. In my opinion, this is a condition that a prudent lay person (someone who possesses an  average knowledge of health and medicine) may potentially expect to result in complications if not addressed urgently such as respiratory distress, impairment of bodily function or dysfunction of bodily organs.   Routine symptom specific, illness specific and/or disease specific instructions were discussed with the patient and/or caregiver at length.   As such, the patient has been evaluated and assessed, work-up was performed and treatment was provided in alignment with urgent care protocols and evidence based medicine.  Patient/parent/caregiver has been advised that the patient may require follow up for further testing and treatment if the symptoms continue in spite of treatment, as clinically indicated and appropriate.  If the patient was tested for COVID-19, Influenza and/or RSV, then the patient/parent/guardian was advised to isolate at home pending the results of his/her diagnostic coronavirus test and potentially longer if they're positive. I have also advised pt that if his/her COVID-19 test returns positive, it's recommended to self-isolate for at least 10 days after symptoms first appeared AND until fever-free for 24 hours without fever reducer AND other symptoms have improved or resolved. Discussed self-isolation recommendations as well as instructions for household member/close contacts as per the [facility] and [place] DHHS, and also gave patient the [facility] packet with this information.  Patient/parent/caregiver has been advised to return to the [facility] or PCP in 3-5 days if no better; to PCP or the Emergency Department if new signs and symptoms develop, or if the current signs or symptoms continue to change or worsen for further workup, evaluation and treatment as clinically indicated and appropriate  The patient will follow up with their current PCP if and as advised. If the patient does not currently have a PCP we will assist them in obtaining one.   The patient may need specialty follow up if the  symptoms continue, in spite of conservative treatment and management, for further workup, evaluation, consultation and treatment as clinically indicated and appropriate.  Patient/parent/caregiver verbalized understanding and agreement of plan as discussed.  All questions  were addressed during visit.  Please see discharge instructions below for further details of plan.  Discharge Instructions:   Discharge Instructions      Your symptoms and physical exam findings are concerning for a viral respiratory infection such as the common cold.  Because your prior respiratory allergies appear to have worsened at this time, this can make you more susceptible to catching respiratory infections.   To avoid catching frequent respiratory infections, having skin reactions, dealing with eye irritation, losing sleep, missing work, etc., due to uncontrolled allergies, it is important that you begin/continue your allergy regimen and are consistent with taking your meds exactly as prescribed.   Please read below to learn more about the medications, dosages and frequencies that I recommend to help alleviate your symptoms and to get you feeling better soon:  Clarinex (loratadine): This is an excellent second-generation antihistamine that helps to reduce respiratory inflammatory response to environmental allergens.  This medication is not known to cause daytime sleepiness so it can be taken in the daytime.  If you find that it does make you sleepy, please feel free to take it at bedtime.   Nasonex (mometasone): This is a steroid nasal spray that used once daily, 1 spray in each nare.  This works best when used on a daily basis. This medication does not work well if it is only used when you think you need it.  After 3 to 5 days of use, you will notice significant reduction of the inflammation and mucus production that is currently being caused by exposure to allergens, whether seasonal or environmental.  The most common  side effect of this medication is nosebleeds.  If you experience a nosebleed, please discontinue use for 1 week, then feel free to resume.   If you find that your insurance will not pay for this medication, please consider a different nasal steroids such as Flonase (fluticasone), or Nasacort (triamcinolone).    If symptoms have not meaningfully improved in the next 5 to 7 days, please return for repeat evaluation or follow-up with your regular provider.  If symptoms have worsened in the next 3 to 5 days, please return for repeat evaluation or follow-up with your regular provider.    Thank you for visiting urgent care today.  We appreciate the opportunity to participate in your care.        This office note has been dictated using Museum/gallery curator.  Unfortunately, this method of dictation can sometimes lead to typographical or grammatical errors.  I apologize for your inconvenience in advance if this occurs.  Please do not hesitate to reach out to me if clarification is needed.

## 2022-07-02 ENCOUNTER — Emergency Department (HOSPITAL_COMMUNITY)
Admission: EM | Admit: 2022-07-02 | Discharge: 2022-07-02 | Disposition: A | Discharge status: Home / Self Care | Payer: Medicare Other | Attending: Emergency Medicine | Admitting: Emergency Medicine

## 2022-07-02 DIAGNOSIS — Z79899 Other long term (current) drug therapy: Secondary | ICD-10-CM | POA: Insufficient documentation

## 2022-07-02 DIAGNOSIS — Z8554 Personal history of malignant neoplasm of ureter: Secondary | ICD-10-CM | POA: Insufficient documentation

## 2022-07-02 DIAGNOSIS — T783XXA Angioneurotic edema, initial encounter: Secondary | ICD-10-CM | POA: Diagnosis not present

## 2022-07-02 DIAGNOSIS — Z87891 Personal history of nicotine dependence: Secondary | ICD-10-CM | POA: Insufficient documentation

## 2022-07-02 DIAGNOSIS — I129 Hypertensive chronic kidney disease with stage 1 through stage 4 chronic kidney disease, or unspecified chronic kidney disease: Secondary | ICD-10-CM | POA: Diagnosis not present

## 2022-07-02 DIAGNOSIS — R220 Localized swelling, mass and lump, head: Secondary | ICD-10-CM | POA: Diagnosis present

## 2022-07-02 DIAGNOSIS — N1831 Chronic kidney disease, stage 3a: Secondary | ICD-10-CM | POA: Diagnosis not present

## 2022-07-02 LAB — CBC WITH DIFFERENTIAL/PLATELET
Abs Immature Granulocytes: 0.03 10*3/uL (ref 0.00–0.07)
Basophils Absolute: 0 10*3/uL (ref 0.0–0.1)
Basophils Relative: 0 %
Eosinophils Absolute: 0.1 10*3/uL (ref 0.0–0.5)
Eosinophils Relative: 1 %
HCT: 42.4 % (ref 39.0–52.0)
Hemoglobin: 13.8 g/dL (ref 13.0–17.0)
Immature Granulocytes: 0 %
Lymphocytes Relative: 14 %
Lymphs Abs: 1.2 10*3/uL (ref 0.7–4.0)
MCH: 28.2 pg (ref 26.0–34.0)
MCHC: 32.5 g/dL (ref 30.0–36.0)
MCV: 86.7 fL (ref 80.0–100.0)
Monocytes Absolute: 0.8 10*3/uL (ref 0.1–1.0)
Monocytes Relative: 10 %
Neutro Abs: 6.2 10*3/uL (ref 1.7–7.7)
Neutrophils Relative %: 75 %
Platelets: 257 10*3/uL (ref 150–400)
RBC: 4.89 MIL/uL (ref 4.22–5.81)
RDW: 13.5 % (ref 11.5–15.5)
WBC: 8.3 10*3/uL (ref 4.0–10.5)
nRBC: 0 % (ref 0.0–0.2)

## 2022-07-02 LAB — BASIC METABOLIC PANEL
Anion gap: 11 (ref 5–15)
BUN: 36 mg/dL — ABNORMAL HIGH (ref 8–23)
CO2: 25 mmol/L (ref 22–32)
Calcium: 9.2 mg/dL (ref 8.9–10.3)
Chloride: 99 mmol/L (ref 98–111)
Creatinine, Ser: 1.47 mg/dL — ABNORMAL HIGH (ref 0.61–1.24)
GFR, Estimated: 50 mL/min — ABNORMAL LOW (ref >60)
Glucose, Bld: 115 mg/dL — ABNORMAL HIGH (ref 70–99)
Potassium: 3.5 mmol/L (ref 3.5–5.1)
Sodium: 135 mmol/L (ref 135–145)

## 2022-07-02 MED ORDER — PREDNISONE 10 MG (21) PO TBPK: ORAL_TABLET | Freq: Every day | ORAL | 0 refills | Status: DC

## 2022-07-02 NOTE — ED Provider Triage Note (Signed)
Emergency Medicine Provider Triage Evaluation Note  Mario Hubbard , a 72 y.o. male  was evaluated in triage.  Pt complains of lip swelling. Woke up with swelling to R side of lip.  No pain.  No fever, headache, trouble swallowing or neck pain.  Notice some blister to the lip.  Recently started steroid nasal spray for seasonal allergy.   Review of Systems  Positive: As above Negative: As above  Physical Exam  BP 116/72 (BP Location: Left Arm)   Pulse 75   Temp 99.1 F (37.3 C) (Oral)   Resp 16   SpO2 97%  Gen:   Awake, no distress   Resp:  Normal effort  MSK:   Moves extremities without difficulty  Other:  Swelling and blister to R side of the lip.  No fullness of the sublingual space, no airway compromise.   Medical Decision Making  Medically screening exam initiated at 10:20 AM.  Appropriate orders placed.  Mario Hubbard was informed that the remainder of the evaluation will be completed by another provider, this initial triage assessment does not replace that evaluation, and the importance of remaining in the ED until their evaluation is complete.     Domenic Moras, PA-C 07/02/22 1023

## 2022-07-02 NOTE — ED Notes (Signed)
Pt in bed, pt c/o fatigue and some facial swelling, pt has swelling to R face, pt has upper dentures and some missing teeth.  States that he had a dentist appointment for today, but was feeling poorly so came to the er instead

## 2022-07-02 NOTE — ED Triage Notes (Signed)
Pt c/o R sided facial swelling that started overnight. Pt with known poor dentition. Pt states he also feels like his throat is closing, but denies SOB. No known exposure to allergen.

## 2022-07-02 NOTE — Discharge Instructions (Addendum)
We evaluated you for your facial swelling.  Your symptoms are caused by a process called angioedema.  Since your symptoms are already improving, it should be safe for you to go home.  We do not always identify the cause of angioedema.  Since you recently started mometasone, I would stop this medication.  Losartan can also be associated with angioedema, even if you have been taking it for a long time.  Please stop this medication as well.  Please follow-up with the allergy center.  Please call for an appointment.  Your lab testing showed a mild kidney injury, sign of dehydration, please follow-up with your primary doctor for blood pressure check and repeat blood work to make sure your kidney function is improving.  If your symptoms worsen or you develop any swelling to your tongue, fevers or chills, dental pain, or any other new symptoms, please return to the emergency department for reassessment.

## 2022-07-03 ENCOUNTER — Encounter: Payer: Self-pay | Admitting: Internal Medicine

## 2022-07-03 ENCOUNTER — Ambulatory Visit (INDEPENDENT_AMBULATORY_CARE_PROVIDER_SITE_OTHER): Payer: Medicare Other | Admitting: Internal Medicine

## 2022-07-03 VITALS — BP 138/82 | HR 84 | Temp 98.4°F | Ht 69.0 in | Wt 195.0 lb

## 2022-07-03 DIAGNOSIS — J301 Allergic rhinitis due to pollen: Secondary | ICD-10-CM

## 2022-07-03 DIAGNOSIS — I10 Essential (primary) hypertension: Secondary | ICD-10-CM

## 2022-07-03 DIAGNOSIS — N1831 Chronic kidney disease, stage 3a: Secondary | ICD-10-CM | POA: Diagnosis not present

## 2022-07-03 MED ORDER — LEVOCETIRIZINE DIHYDROCHLORIDE 5 MG PO TABS: 5.0000 mg | ORAL_TABLET | Freq: Every evening | ORAL | 1 refills | Status: DC

## 2022-07-03 MED ORDER — INDAPAMIDE 1.25 MG PO TABS: 1.2500 mg | ORAL_TABLET | Freq: Every day | ORAL | 0 refills | Status: DC

## 2022-07-03 NOTE — Patient Instructions (Signed)
Hypertension, Adult High blood pressure (hypertension) is when the force of blood pumping through the arteries is too strong. The arteries are the blood vessels that carry blood from the heart throughout the body. Hypertension forces the heart to work harder to pump blood and may cause arteries to become narrow or stiff. Untreated or uncontrolled hypertension can lead to a heart attack, heart failure, a stroke, kidney disease, and other problems. A blood pressure reading consists of a higher number over a lower number. Ideally, your blood pressure should be below 120/80. The first ("top") number is called the systolic pressure. It is a measure of the pressure in your arteries as your heart beats. The second ("bottom") number is called the diastolic pressure. It is a measure of the pressure in your arteries as the heart relaxes. What are the causes? The exact cause of this condition is not known. There are some conditions that result in high blood pressure. What increases the risk? Certain factors may make you more likely to develop high blood pressure. Some of these risk factors are under your control, including: Smoking. Not getting enough exercise or physical activity. Being overweight. Having too much fat, sugar, calories, or salt (sodium) in your diet. Drinking too much alcohol. Other risk factors include: Having a personal history of heart disease, diabetes, high cholesterol, or kidney disease. Stress. Having a family history of high blood pressure and high cholesterol. Having obstructive sleep apnea. Age. The risk increases with age. What are the signs or symptoms? High blood pressure may not cause symptoms. Very high blood pressure (hypertensive crisis) may cause: Headache. Fast or irregular heartbeats (palpitations). Shortness of breath. Nosebleed. Nausea and vomiting. Vision changes. Severe chest pain, dizziness, and seizures. How is this diagnosed? This condition is diagnosed by  measuring your blood pressure while you are seated, with your arm resting on a flat surface, your legs uncrossed, and your feet flat on the floor. The cuff of the blood pressure monitor will be placed directly against the skin of your upper arm at the level of your heart. Blood pressure should be measured at least twice using the same arm. Certain conditions can cause a difference in blood pressure between your right and left arms. If you have a high blood pressure reading during one visit or you have normal blood pressure with other risk factors, you may be asked to: Return on a different day to have your blood pressure checked again. Monitor your blood pressure at home for 1 week or longer. If you are diagnosed with hypertension, you may have other blood or imaging tests to help your health care provider understand your overall risk for other conditions. How is this treated? This condition is treated by making healthy lifestyle changes, such as eating healthy foods, exercising more, and reducing your alcohol intake. You may be referred for counseling on a healthy diet and physical activity. Your health care provider may prescribe medicine if lifestyle changes are not enough to get your blood pressure under control and if: Your systolic blood pressure is above 130. Your diastolic blood pressure is above 80. Your personal target blood pressure may vary depending on your medical conditions, your age, and other factors. Follow these instructions at home: Eating and drinking  Eat a diet that is high in fiber and potassium, and low in sodium, added sugar, and fat. An example of this eating plan is called the DASH diet. DASH stands for Dietary Approaches to Stop Hypertension. To eat this way: Eat   plenty of fresh fruits and vegetables. Try to fill one half of your plate at each meal with fruits and vegetables. Eat whole grains, such as whole-wheat pasta, brown rice, or whole-grain bread. Fill about one  fourth of your plate with whole grains. Eat or drink low-fat dairy products, such as skim milk or low-fat yogurt. Avoid fatty cuts of meat, processed or cured meats, and poultry with skin. Fill about one fourth of your plate with lean proteins, such as fish, chicken without skin, beans, eggs, or tofu. Avoid pre-made and processed foods. These tend to be higher in sodium, added sugar, and fat. Reduce your daily sodium intake. Many people with hypertension should eat less than 1,500 mg of sodium a day. Do not drink alcohol if: Your health care provider tells you not to drink. You are pregnant, may be pregnant, or are planning to become pregnant. If you drink alcohol: Limit how much you have to: 0-1 drink a day for women. 0-2 drinks a day for men. Know how much alcohol is in your drink. In the [place] one drink equals one 12 oz bottle of beer (355 mL), one 5 oz glass of wine (148 mL), or one 1 oz glass of hard liquor (44 mL). Lifestyle  Work with your health care provider to maintain a healthy body weight or to lose weight. Ask what an ideal weight is for you. Get at least 30 minutes of exercise that causes your heart to beat faster (aerobic exercise) most days of the week. Activities may include walking, swimming, or biking. Include exercise to strengthen your muscles (resistance exercise), such as Pilates or lifting weights, as part of your weekly exercise routine. Try to do these types of exercises for 30 minutes at least 3 days a week. Do not use any products that contain nicotine or tobacco. These products include cigarettes, chewing tobacco, and vaping devices, such as e-cigarettes. If you need help quitting, ask your health care provider. Monitor your blood pressure at home as told by your health care provider. Keep all follow-up visits. This is important. Medicines Take over-the-counter and prescription medicines only as told by your health care provider. Follow directions carefully. Blood  pressure medicines must be taken as prescribed. Do not skip doses of blood pressure medicine. Doing this puts you at risk for problems and can make the medicine less effective. Ask your health care provider about side effects or reactions to medicines that you should watch for. Contact a health care provider if you: Think you are having a reaction to a medicine you are taking. Have headaches that keep coming back (recurring). Feel dizzy. Have swelling in your ankles. Have trouble with your vision. Get help right away if you: Develop a severe headache or confusion. Have unusual weakness or numbness. Feel faint. Have severe pain in your chest or abdomen. Vomit repeatedly. Have trouble breathing. These symptoms may be an emergency. Get help right away. Call 911. Do not wait to see if the symptoms will go away. Do not drive yourself to the hospital. Summary Hypertension is when the force of blood pumping through your arteries is too strong. If this condition is not controlled, it may put you at risk for serious complications. Your personal target blood pressure may vary depending on your medical conditions, your age, and other factors. For most people, a normal blood pressure is less than 120/80. Hypertension is treated with lifestyle changes, medicines, or a combination of both. Lifestyle changes include losing weight, eating a healthy,   low-sodium diet, exercising more, and limiting alcohol. This information is not intended to replace advice given to you by your health care provider. Make sure you discuss any questions you have with your health care provider. Document Revised: 01/31/2021 Document Reviewed: 01/31/2021 Elsevier Patient Education  2023 [employer]  

## 2022-07-03 NOTE — Progress Notes (Signed)
Subjective:  Patient ID: Mario Hubbard, male    DOB: Nov 22, 1950  Age: 72 y.o. MRN: [id number]  CC: Hypertension   HPI Mario Hubbard presents for f/up --  A week ago he developed chills and took Robitussin.  He later complained of postnasal drip with clear phlegm and right facial swelling.  He was seen in the ED for an allergic reaction.  He was treated with prednisone and has gotten some symptom relief.  Clarinex was prescribed but he is not taking it yet.  He complains of fatigue but denies sore throat, lymphadenopathy, rash, chest pain, shortness of breath, or wheezing.  Outpatient Medications Prior to Visit  Medication Sig Dispense Refill   amLODipine (NORVASC) 10 MG tablet Take 1 tablet (10 mg total) by mouth daily. 90 tablet 3   metoprolol succinate (TOPROL-XL) 50 MG 24 hr tablet Take 1 tablet (50 mg total) by mouth daily. Take with or immediately following a meal. 90 tablet 1   potassium chloride SA (KLOR-CON M20) 20 MEQ tablet Take 1 tablet (20 mEq total) by mouth 3 (three) times daily. 270 tablet 1   predniSONE (STERAPRED UNI-PAK 21 TAB) 10 MG (21) TBPK tablet Take by mouth daily. Take 6 tabs by mouth daily  for 2 days, then 5 tabs for 2 days, then 4 tabs for 2 days, then 3 tabs for 2 days, 2 tabs for 2 days, then 1 tab by mouth daily for 2 days 42 tablet 0   sildenafil (VIAGRA) 50 MG tablet Take 1 tablet (50 mg total) by mouth daily as needed for erectile dysfunction. 10 tablet 11   indapamide (LOZOL) 2.5 MG tablet Take 1 tablet (2.5 mg total) by mouth daily. 90 tablet 3   atorvastatin (LIPITOR) 20 MG tablet Take 1 tablet (20 mg total) by mouth daily. (Patient not taking: Reported on 07/03/2022) 90 tablet 3   metoprolol tartrate (LOPRESSOR) 50 MG tablet Take 50 mg by mouth 2 (two) times daily. (Patient not taking: Reported on 07/03/2022)     desloratadine (CLARINEX) 5 MG tablet Take 1 tablet (5 mg total) by mouth daily. (Patient not taking: Reported on 07/03/2022) 30 tablet 2   No  facility-administered medications prior to visit.    ROS Review of Systems  Constitutional:  Positive for chills and fatigue. Negative for appetite change, diaphoresis, fever and unexpected weight change.  HENT:  Positive for facial swelling, postnasal drip, rhinorrhea and sneezing. Negative for nosebleeds, sinus pressure, sinus pain, sore throat and trouble swallowing.   Eyes: Negative.   Respiratory:  Negative for cough, chest tightness, shortness of breath and wheezing.   Cardiovascular:  Negative for chest pain, palpitations and leg swelling.  Gastrointestinal:  Negative for abdominal pain, constipation, diarrhea, nausea and vomiting.  Endocrine: Negative.   Genitourinary: Negative.  Negative for difficulty urinating.  Musculoskeletal: Negative.   Skin: Negative.   Neurological:  Negative for dizziness, weakness and headaches.  Hematological:  Negative for adenopathy. Does not bruise/bleed easily.  Psychiatric/Behavioral: Negative.      Objective:  BP 138/82 (BP Location: Left Arm, Patient Position: Sitting, Cuff Size: Normal)   Pulse 84   Temp 98.4 F (36.9 C) (Oral)   Ht 5\' 9"  (1.753 m)   Wt 195 lb (88.5 kg)   SpO2 97%   BMI 28.80 kg/m   BP Readings from Last 3 Encounters:  07/03/22 138/82  07/02/22 119/72  06/29/22 122/78    Wt Readings from Last 3 Encounters:  07/03/22 195 lb (88.5 kg)  04/12/22  204 lb 1.6 oz (92.6 kg)  03/15/22 206 lb (93.4 kg)    Physical Exam Vitals reviewed.  Constitutional:      Appearance: He is not ill-appearing.  HENT:     Nose: Nose normal. No mucosal edema, congestion or rhinorrhea.     Mouth/Throat:     Mouth: Mucous membranes are moist.  Eyes:     General: No scleral icterus.    Conjunctiva/sclera: Conjunctivae normal.  Cardiovascular:     Rate and Rhythm: Normal rate and regular rhythm.     Heart sounds: No murmur heard.    No friction rub. No gallop.  Pulmonary:     Effort: Pulmonary effort is normal.     Breath  sounds: No stridor. No wheezing, rhonchi or rales.  Abdominal:     General: Abdomen is flat.     Palpations: There is no mass.     Tenderness: There is no abdominal tenderness. There is no guarding or rebound.     Hernia: No hernia is present.  Musculoskeletal:        General: Normal range of motion.     Cervical back: Neck supple.     Right lower leg: No edema.     Left lower leg: No edema.  Lymphadenopathy:     Cervical: No cervical adenopathy.  Skin:    General: Skin is warm and dry.  Neurological:     General: No focal deficit present.     Mental Status: He is alert. Mental status is at baseline.  Psychiatric:        Mood and Affect: Mood normal.        Behavior: Behavior normal.     Lab Results  Component Value Date   WBC 8.3 07/02/2022   HGB 13.8 07/02/2022   HCT 42.4 07/02/2022   PLT 257 07/02/2022   GLUCOSE 115 (H) 07/02/2022   CHOL 172 09/13/2021   TRIG 99.0 09/13/2021   HDL 40.20 09/13/2021   LDLCALC 112 (H) 09/13/2021   ALT 11 04/05/2022   AST 14 (L) 04/05/2022   NA 135 07/02/2022   K 3.5 07/02/2022   CL 99 07/02/2022   CREATININE 1.47 (H) 07/02/2022   BUN 36 (H) 07/02/2022   CO2 25 07/02/2022   TSH 1.327 04/05/2022   PSA 2.68 09/13/2021   HGBA1C 5.9 09/13/2021    No results found.  Assessment & Plan:  Stage 3a chronic kidney disease ([place])- He has a prerenal azotemia.  Will decrease the dose of indapamide.  Seasonal allergic rhinitis due to pollen- I have asked him to start taking an antihistamine. -     Levocetirizine Dihydrochloride; Take 1 tablet (5 mg total) by mouth every evening.  Dispense: 90 tablet; Refill: 1  Primary hypertension -     Indapamide; Take 1 tablet (1.25 mg total) by mouth daily.  Dispense: 90 tablet; Refill: 0     Follow-up: Return in about 3 months (around 10/03/2022).  Scarlette Calico, MD

## 2022-07-03 NOTE — ED Provider Notes (Signed)
[facility] Provider Note  CSN: [id number] Arrival date & time: 07/02/22 [id number]  Chief Complaint(s) Facial Swelling  HPI Mario Hubbard is a 72 y.o. male with history of arthritis, hypertension, CKD, urethral cancer presenting to the emergency department with right facial swelling.  Reports that he noticed that today.  Denies any dental pain.  No fevers or chills.  No rashes or itching.  No wheezing.  No lightheadedness or dizziness.  No syncope.  No nausea or diarrhea.  Reports he went to an urgent care for his nasal congestion and recently was started on mometasone.  No other new medication changes.  No tongue swelling, difficulty breathing, difficulty swallowing.  Denies similar episodes in the past.   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 Authorizing Provider  predniSONE (STERAPRED UNI-PAK 21 TAB) 10 MG (21) TBPK tablet Take by mouth daily. Take 6 tabs by mouth daily  for 2 days, then 5 tabs for 2 days, then 4 tabs for 2 days, then 3 tabs for 2 days,  2 tabs for 2 days, then 1 tab by mouth daily for 2 days 07/02/22  Yes Cristie Hem, MD  amLODipine (NORVASC) 10 MG tablet Take 1 tablet (10 mg total) by mouth daily. 09/13/21   Biagio Borg, MD  atorvastatin (LIPITOR) 20 MG tablet Take 1 tablet (20 mg total) by mouth daily. Patient not taking: Reported on 07/03/2022 09/13/21 09/13/22  Biagio Borg, MD  desloratadine (CLARINEX) 5 MG tablet Take 1 tablet (5 mg total) by mouth daily. Patient not taking: Reported on 07/03/2022 06/29/22 09/27/22  Lynden Oxford Scales, PA-C  indapamide (LOZOL) 2.5 MG tablet Take 1 tablet (2.5 mg total) by mouth daily. 09/13/21   Biagio Borg, MD  metoprolol succinate (TOPROL-XL) 50 MG 24 hr tablet Take 1 tablet (50 mg total) by mouth daily. Take with or immediately following a meal. 03/15/22   [name], MD  metoprolol tartrate (LOPRESSOR) 50 MG tablet Take 50 mg by mouth 2 (two) times daily. Patient not taking: Reported on 07/03/2022 05/29/22   [provider]  potassium chloride SA (KLOR-CON M20) 20 MEQ tablet Take 1 tablet (20 mEq total) by mouth 3 (three) times daily. 03/15/22   [name], MD  sildenafil (VIAGRA) 50 MG tablet Take 1 tablet (50 mg total) by mouth daily as needed for erectile dysfunction. 09/13/21   Biagio Borg, MD  Past Surgical History No past surgical history on file. Family History Family History  Problem Relation Age of Onset   Kidney disease Mother    Hypertension Mother    Healthy Sister    Diabetes Brother     Social History Social History   Tobacco Use   Smoking status: Former   Smokeless tobacco: Never  Substance Use Topics   Alcohol use: Never   Drug use: Never   Allergies Patient has no known allergies.  Review of Systems Review of Systems  All other systems reviewed and are negative.   Physical Exam Vital Signs  I have  reviewed the triage vital signs BP 119/72   Pulse (!) 59   Temp 99.1 F (37.3 C) (Oral)   Resp 16   SpO2 100%  Physical Exam Vitals and nursing note reviewed.  Constitutional:      General: He is not in acute distress.    Appearance: Normal appearance.  HENT:     Head:     Comments: To the right face including the right upper and lower lips, right cheek, soft tissue swelling present, no tenderness or induration, no erythema.    Mouth/Throat:     Mouth: Mucous membranes are moist.     Comments: No tongue swelling, uvula swelling.  Floor of mouth soft.  No dental tenderness to percussion.  No evidence of periapical abscess. Eyes:     Conjunctiva/sclera: Conjunctivae normal.  Cardiovascular:     Rate and Rhythm: Normal rate and regular rhythm.  Pulmonary:     Effort: Pulmonary effort is normal. No respiratory distress.     Breath sounds: Normal breath sounds.  Abdominal:     General: Abdomen is flat.     Palpations: Abdomen is soft.     Tenderness: There is no abdominal tenderness.  Musculoskeletal:     Right lower leg: No edema.     Left lower leg: No edema.  Skin:    General: Skin is warm and dry.     Capillary Refill: Capillary refill takes less than 2 seconds.  Neurological:     Mental Status: He is alert and oriented to person, place, and time. Mental status is at baseline.  Psychiatric:        Mood and Affect: Mood normal.        Behavior: Behavior normal.     ED Results and Treatments Labs (all labs ordered are listed, but only abnormal results are displayed) Labs Reviewed  BASIC METABOLIC PANEL - Abnormal; Notable for the following components:      Result Value   Glucose, Bld 115 (*)    BUN 36 (*)    Creatinine, Ser 1.47 (*)    GFR, Estimated 50 (*)    All other components within normal limits  CBC WITH DIFFERENTIAL/PLATELET  Radiology No results found.  Pertinent labs & imaging results that were available during my care of the patient were reviewed by me and considered in my medical decision making (see MDM for details).  Medications Ordered in ED Medications - No data to display                                                                                                                                   Procedures Procedures  (including critical care time)  Medical Decision Making / ED Course   MDM:  72 year old male presenting to the emergency department with facial swelling.  On exam, symptoms seem most consistent with mild angioedema.  He has no tongue swelling.  He has no other allergic symptoms such as urticaria, wheezing, itchiness and this is due to an allergic reaction.  He has no difficulty swallowing or tongue swelling to raise concern for airway obstruction.  He reports that is actually improved since it began this morning.  He woke up with the symptoms.  Patient is on a ARB, will discontinue, angioedema most typically related to ACE inhibitor but also can be associated with ARB.  Also recently started mometasone, advised to discontinue this since it is the only other new medication is started.  Given that symptoms are already improving, and he is no tongue or pharyngeal involvement, I think it is safe for patient to discharge and follow-up with his primary physician.  Doubt dental process or infection such as parotitis, sialoadenitis, no tenderness to percussion of the teeth, no sign of periapical abscess, on the face is swollen and is very soft and not indurated, no erythema and no tenderness at all.  He also has no leukocytosis to suggest infection.  He does have some poor dentition so advised that he should still follow-up with a dentist.  Discussed return precautions such as any worsening, any tongue involvement, or if he does develop signs of infection such as fevers or chills, pain to  the face, or any other concerning process.  Labs also notable for AKI, discussed need for recheck with primary physician.      Additional history obtained: -Additional history obtained from spouse -External records from outside source obtained and reviewed including: Chart review including previous notes, labs, imaging, consultation notes including recent UC note   Lab Tests: -I ordered, reviewed, and interpreted labs.   The pertinent results include:   Labs Reviewed  BASIC METABOLIC PANEL - Abnormal; Notable for the following components:      Result Value   Glucose, Bld 115 (*)    BUN 36 (*)    Creatinine, Ser 1.47 (*)    GFR, Estimated 50 (*)    All other components within normal limits  CBC WITH DIFFERENTIAL/PLATELET    Notable for mild AKI  Medicines ordered and prescription drug management: Meds ordered this encounter  Medications   predniSONE (STERAPRED UNI-PAK 21 TAB) 10 MG (21)  TBPK tablet    Sig: Take by mouth daily. Take 6 tabs by mouth daily  for 2 days, then 5 tabs for 2 days, then 4 tabs for 2 days, then 3 tabs for 2 days, 2 tabs for 2 days, then 1 tab by mouth daily for 2 days    Dispense:  42 tablet    Refill:  0    -I have reviewed the patients home medicines and have made adjustments as needed  Cardiac Monitoring: The patient was maintained on a cardiac monitor.  I personally viewed and interpreted the cardiac monitored which showed an underlying rhythm of: NSR  Social Determinants of Health:  Diagnosis or treatment significantly limited by social determinants of health: obesity   Reevaluation: After the interventions noted above, I reevaluated the patient and found that their symptoms have improved  Co morbidities that complicate the patient evaluation  Past Medical History:  Diagnosis Date   Arthritis    Hypertension       Dispostion: Disposition decision including need for hospitalization was considered, and patient discharged from emergency  department.    Final Clinical Impression(s) / ED Diagnoses Final diagnoses:  Angioedema, initial encounter     This chart was dictated using voice recognition software.  Despite best efforts to proofread,  errors can occur which can change the documentation meaning.    Cristie Hem, MD 07/03/22 1450

## 2022-07-10 ENCOUNTER — Other Ambulatory Visit: Payer: Self-pay | Admitting: Internal Medicine

## 2022-07-10 DIAGNOSIS — I10 Essential (primary) hypertension: Secondary | ICD-10-CM

## 2022-08-01 ENCOUNTER — Other Ambulatory Visit: Payer: Self-pay | Admitting: Internal Medicine

## 2022-08-01 DIAGNOSIS — E876 Hypokalemia: Secondary | ICD-10-CM

## 2022-08-01 DIAGNOSIS — I10 Essential (primary) hypertension: Secondary | ICD-10-CM

## 2022-08-13 ENCOUNTER — Inpatient Hospital Stay: Payer: Medicare Other | Attending: Oncology

## 2022-08-13 DIAGNOSIS — Z95828 Presence of other vascular implants and grafts: Secondary | ICD-10-CM

## 2022-08-13 DIAGNOSIS — C609 Malignant neoplasm of penis, unspecified: Secondary | ICD-10-CM

## 2022-08-13 DIAGNOSIS — Z8551 Personal history of malignant neoplasm of bladder: Secondary | ICD-10-CM | POA: Insufficient documentation

## 2022-08-13 MED ORDER — SODIUM CHLORIDE 0.9% FLUSH
10.0000 mL | Freq: Once | INTRAVENOUS | Status: AC
  Administered 2022-08-13: 10 mL

## 2022-08-13 MED ORDER — HEPARIN SOD (PORK) LOCK FLUSH 100 UNIT/ML IV SOLN
500.0000 [IU] | Freq: Once | INTRAVENOUS | Status: AC
  Administered 2022-08-13: 500 [IU]

## 2022-08-26 ENCOUNTER — Other Ambulatory Visit: Payer: Self-pay | Admitting: Internal Medicine

## 2022-08-26 DIAGNOSIS — I10 Essential (primary) hypertension: Secondary | ICD-10-CM

## 2022-09-02 ENCOUNTER — Other Ambulatory Visit: Payer: Self-pay | Admitting: Internal Medicine

## 2022-09-02 DIAGNOSIS — I10 Essential (primary) hypertension: Secondary | ICD-10-CM

## 2022-09-04 ENCOUNTER — Other Ambulatory Visit: Payer: Self-pay | Admitting: Internal Medicine

## 2022-09-04 DIAGNOSIS — E785 Hyperlipidemia, unspecified: Secondary | ICD-10-CM

## 2022-09-04 DIAGNOSIS — I10 Essential (primary) hypertension: Secondary | ICD-10-CM

## 2022-09-04 MED ORDER — ATORVASTATIN CALCIUM 20 MG PO TABS: 20.0000 mg | ORAL_TABLET | Freq: Every day | ORAL | 0 refills | Status: DC

## 2022-09-04 MED ORDER — AMLODIPINE BESYLATE 10 MG PO TABS: 10.0000 mg | ORAL_TABLET | Freq: Every day | ORAL | 0 refills | Status: DC

## 2022-09-13 ENCOUNTER — Ambulatory Visit (INDEPENDENT_AMBULATORY_CARE_PROVIDER_SITE_OTHER): Payer: Medicare Other | Admitting: Internal Medicine

## 2022-09-13 ENCOUNTER — Encounter: Payer: Self-pay | Admitting: Internal Medicine

## 2022-09-13 VITALS — BP 138/76 | HR 51 | Temp 98.1°F | Resp 16 | Ht 69.0 in | Wt 201.0 lb

## 2022-09-13 DIAGNOSIS — N401 Enlarged prostate with lower urinary tract symptoms: Secondary | ICD-10-CM

## 2022-09-13 DIAGNOSIS — R739 Hyperglycemia, unspecified: Secondary | ICD-10-CM

## 2022-09-13 DIAGNOSIS — I10 Essential (primary) hypertension: Secondary | ICD-10-CM

## 2022-09-13 DIAGNOSIS — N1831 Chronic kidney disease, stage 3a: Secondary | ICD-10-CM | POA: Diagnosis not present

## 2022-09-13 DIAGNOSIS — E785 Hyperlipidemia, unspecified: Secondary | ICD-10-CM

## 2022-09-13 DIAGNOSIS — R3912 Poor urinary stream: Secondary | ICD-10-CM

## 2022-09-13 LAB — URINALYSIS, ROUTINE W REFLEX MICROSCOPIC
Bilirubin Urine: NEGATIVE
Hgb urine dipstick: NEGATIVE
Ketones, ur: NEGATIVE
Leukocytes,Ua: NEGATIVE
Nitrite: NEGATIVE
RBC / HPF: NONE SEEN (ref 0–?)
Specific Gravity, Urine: 1.025 (ref 1.000–1.030)
Total Protein, Urine: NEGATIVE
Urine Glucose: NEGATIVE
Urobilinogen, UA: 0.2 (ref 0.0–1.0)
pH: 5.5 (ref 5.0–8.0)

## 2022-09-13 LAB — LIPID PANEL
Cholesterol: 147 mg/dL (ref 0–200)
HDL: 39.9 mg/dL (ref >39.00)
LDL Cholesterol: 79 mg/dL (ref 0–99)
NonHDL: 106.75
Total CHOL/HDL Ratio: 4
Triglycerides: 140 mg/dL (ref 0.0–149.0)
VLDL: 28 mg/dL (ref 0.0–40.0)

## 2022-09-13 LAB — BASIC METABOLIC PANEL
BUN: 13 mg/dL (ref 6–23)
CO2: 29 mEq/L (ref 19–32)
Calcium: 9.8 mg/dL (ref 8.4–10.5)
Chloride: 103 mEq/L (ref 96–112)
Creatinine, Ser: 1.08 mg/dL (ref 0.40–1.50)
GFR: 68.22 mL/min (ref >60.00)
Glucose, Bld: 110 mg/dL — ABNORMAL HIGH (ref 70–99)
Potassium: 3.7 mEq/L (ref 3.5–5.1)
Sodium: 141 mEq/L (ref 135–145)

## 2022-09-13 LAB — PSA: PSA: 3.05 ng/mL (ref 0.10–4.00)

## 2022-09-13 LAB — HEMOGLOBIN A1C: Hgb A1c MFr Bld: 5.6 % (ref 4.6–6.5)

## 2022-09-13 NOTE — Patient Instructions (Signed)
Bradycardia, Adult Bradycardia is a slower-than-normal heartbeat. A normal resting heart rate for an adult ranges from 60 to 100 beats per minute. With bradycardia, the resting heart rate is less than 60 beats per minute. Bradycardia can prevent enough oxygen from reaching certain areas of your body when you are active. It can be serious if it keeps enough oxygen from reaching your brain and other parts of your body. Bradycardia is not a problem for everyone. For some healthy adults, a slow resting heart rate is normal. What are the causes? This condition may be caused by: A problem with the heart, including: A problem with the heart's electrical system, such as a heart block. With a heart block, electrical signals between the chambers of the heart are partially or completely blocked, so they are not able to work as they should. A problem with the heart's natural pacemaker (sinus node). Heart disease. A heart attack. Heart damage. Lyme disease. A heart infection. A heart condition that is present at birth (congenital heart defect). Certain medicines that treat heart conditions. Certain conditions, such as hypothyroidism and obstructive sleep apnea. Problems with the balance of chemicals and other substances, like potassium, in the blood. Trauma. Radiation therapy. What increases the risk? You are more likely to develop this condition if you: Are age 65 or older. Have high blood pressure (hypertension), high cholesterol (hyperlipidemia), or diabetes. Drink heavily, use tobacco or nicotine products, or use drugs. What are the signs or symptoms? Symptoms of this condition include: Light-headedness. Feeling faint or fainting. Fatigue and weakness. Trouble with activity or exercise. Shortness of breath. Chest pain (angina). Drowsiness. Confusion. Dizziness. How is this diagnosed? This condition may be diagnosed based on: Your symptoms. Your medical history. A physical exam. During  the exam, your health care provider will listen to your heartbeat and check your pulse. To confirm the diagnosis, your health care provider may order tests, such as: Blood tests. An electrocardiogram (ECG). This test records the heart's electrical activity. The test can show how fast your heart is beating and whether the heartbeat is steady. A test in which you wear a portable device (event recorder or Holter monitor) to record your heart's electrical activity while you go about your day. An exercise test. How is this treated? Treatment for this condition depends on the cause of the condition and how severe your symptoms are. Treatment may involve: Treatment of the underlying condition. Changing your medicines or how much medicine you take. Having a small, battery-operated device called a pacemaker implanted under the skin. When bradycardia occurs, this device can be used to increase your heart rate and help your heart beat in a regular rhythm. Follow these instructions at home: Lifestyle Manage any health conditions that contribute to bradycardia as told by your health care provider. Follow a heart-healthy diet. A nutrition specialist (dietitian) can help educate you about healthy food options and changes. Follow an exercise program that is approved by your health care provider. Maintain a healthy weight. Try to reduce or manage your stress, such as with yoga or meditation. If you need help reducing stress, ask your health care provider. Do not use any products that contain nicotine or tobacco. These products include cigarettes, chewing tobacco, and vaping devices, such as e-cigarettes. If you need help quitting, ask your health care provider. Do not use illegal drugs. Alcohol use If you drink alcohol: Limit how much you have to: 0-1 drink a day for women who are not pregnant. 0-2 drinks a day   for men. Know how much alcohol is in a drink. In the [place] one drink equals one 12 oz bottle of  beer (355 mL), one 5 oz glass of wine (148 mL), or one 1 oz glass of hard liquor (44 mL). General instructions Take over-the-counter and prescription medicines only as told by your health care provider. Keep all follow-up visits. This is important. How is this prevented? In some cases, bradycardia may be prevented by: Treating underlying medical problems. Stopping behaviors or medicines that can trigger the condition. Contact a health care provider if: You feel light-headed or dizzy. You almost faint. You feel weak or are easily fatigued during physical activity. You experience confusion or have memory problems. Get help right away if: You faint. You have chest pains or an irregular heartbeat (palpitations). You have trouble breathing. These symptoms may represent a serious problem that is an emergency. Do not wait to see if the symptoms will go away. Get medical help right away. Call your local emergency services (911 in the [place] Do not drive yourself to the hospital. Summary Bradycardia is a slower-than-normal heartbeat. With bradycardia, the resting heart rate is less than 60 beats per minute. Treatment for this condition depends on the cause. Manage any health conditions that contribute to bradycardia as told by your health care provider. Do not use any products that contain nicotine or tobacco. These products include cigarettes, chewing tobacco, and vaping devices, such as e-cigarettes. Keep all follow-up visits. This is important. This information is not intended to replace advice given to you by your health care provider. Make sure you discuss any questions you have with your health care provider. Document Revised: 07/17/2020 Document Reviewed: 07/17/2020 Elsevier Patient Education  2024 [employer]  

## 2022-09-13 NOTE — Progress Notes (Unsigned)
Subjective:  Patient ID: Mario Hubbard, male    DOB: 12-05-1950  Age: 72 y.o. MRN: [id number]  CC: No chief complaint on file.   HPI Mario Hubbard presents for ***  Outpatient Medications Prior to Visit  Medication Sig Dispense Refill   amLODipine (NORVASC) 10 MG tablet Take 1 tablet (10 mg total) by mouth daily. 90 tablet 0   atorvastatin (LIPITOR) 20 MG tablet Take 1 tablet (20 mg total) by mouth daily. 90 tablet 0   indapamide (LOZOL) 1.25 MG tablet TAKE 1 TABLET BY MOUTH DAILY. 90 tablet 0   levocetirizine (XYZAL) 5 MG tablet Take 1 tablet (5 mg total) by mouth every evening. 90 tablet 1   metoprolol succinate (TOPROL-XL) 50 MG 24 hr tablet TAKE 1 TABLET BY MOUTH DAILY. TAKE WITH OR IMMEDIATELY FOLLOWING A MEAL. 90 tablet 0   potassium chloride SA (KLOR-CON M20) 20 MEQ tablet TAKE 1 TABLET BY MOUTH 3 TIMES DAILY. 270 tablet 0   predniSONE (STERAPRED UNI-PAK 21 TAB) 10 MG (21) TBPK tablet Take by mouth daily. Take 6 tabs by mouth daily  for 2 days, then 5 tabs for 2 days, then 4 tabs for 2 days, then 3 tabs for 2 days, 2 tabs for 2 days, then 1 tab by mouth daily for 2 days 42 tablet 0   sildenafil (VIAGRA) 50 MG tablet Take 1 tablet (50 mg total) by mouth daily as needed for erectile dysfunction. 10 tablet 11   No facility-administered medications prior to visit.    ROS Review of Systems  Objective:  BP 138/76 (BP Location: Left Arm, Patient Position: Sitting, Cuff Size: Large)   Pulse (!) 51   Temp 98.1 F (36.7 C) (Oral)   Ht 5\' 9"  (1.753 m)   Wt 201 lb (91.2 kg)   SpO2 95%   BMI 29.68 kg/m   BP Readings from Last 3 Encounters:  09/13/22 138/76  07/03/22 138/82  07/02/22 119/72    Wt Readings from Last 3 Encounters:  09/13/22 201 lb (91.2 kg)  07/03/22 195 lb (88.5 kg)  04/12/22 204 lb 1.6 oz (92.6 kg)    Physical Exam  Lab Results  Component Value Date   WBC 8.3 07/02/2022   HGB 13.8 07/02/2022   HCT 42.4 07/02/2022   PLT 257 07/02/2022   GLUCOSE 110 (H)  09/13/2022   CHOL 147 09/13/2022   TRIG 140.0 09/13/2022   HDL 39.90 09/13/2022   LDLCALC 79 09/13/2022   ALT 11 04/05/2022   AST 14 (L) 04/05/2022   NA 141 09/13/2022   K 3.7 09/13/2022   CL 103 09/13/2022   CREATININE 1.08 09/13/2022   BUN 13 09/13/2022   CO2 29 09/13/2022   TSH 1.327 04/05/2022   PSA 3.05 09/13/2022   HGBA1C 5.6 09/13/2022    No results found.  Assessment & Plan:  Benign prostatic hyperplasia with weak urinary stream -     PSA; Future -     Urinalysis, Routine w reflex microscopic; Future  Hyperlipidemia LDL goal <100 -     Lipid panel; Future  Primary hypertension -     Urinalysis, Routine w reflex microscopic; Future -     Basic metabolic panel; Future  Stage 3a chronic kidney disease ([facility]) -     Urinalysis, Routine w reflex microscopic; Future  Chronic hyperglycemia -     Hemoglobin A1c; Future -     Basic metabolic panel; Future     Follow-up: Return in about 4 months (around 01/13/2023).  Scarlette Calico, MD

## 2022-09-14 ENCOUNTER — Encounter: Payer: Self-pay | Admitting: Internal Medicine

## 2022-10-15 ENCOUNTER — Encounter: Payer: Self-pay | Admitting: Hematology and Oncology

## 2022-10-17 ENCOUNTER — Ambulatory Visit (HOSPITAL_COMMUNITY)
Admission: RE | Admit: 2022-10-17 | Discharge: 2022-10-17 | Disposition: A | Discharge status: Home / Self Care | Payer: Medicare Other | Source: Ambulatory Visit | Attending: Oncology | Admitting: Oncology

## 2022-10-17 ENCOUNTER — Inpatient Hospital Stay: Payer: Medicare Other | Attending: Oncology

## 2022-10-17 DIAGNOSIS — Z87891 Personal history of nicotine dependence: Secondary | ICD-10-CM | POA: Insufficient documentation

## 2022-10-17 DIAGNOSIS — Z95828 Presence of other vascular implants and grafts: Secondary | ICD-10-CM

## 2022-10-17 DIAGNOSIS — C609 Malignant neoplasm of penis, unspecified: Secondary | ICD-10-CM | POA: Insufficient documentation

## 2022-10-17 DIAGNOSIS — Z8551 Personal history of malignant neoplasm of bladder: Secondary | ICD-10-CM | POA: Insufficient documentation

## 2022-10-17 LAB — CBC WITH DIFFERENTIAL (CANCER CENTER ONLY)
Abs Immature Granulocytes: 0.01 10*3/uL (ref 0.00–0.07)
Basophils Absolute: 0 10*3/uL (ref 0.0–0.1)
Basophils Relative: 0 %
Eosinophils Absolute: 0.3 10*3/uL (ref 0.0–0.5)
Eosinophils Relative: 5 %
HCT: 41 % (ref 39.0–52.0)
Hemoglobin: 13.4 g/dL (ref 13.0–17.0)
Immature Granulocytes: 0 %
Lymphocytes Relative: 40 %
Lymphs Abs: 2.4 10*3/uL (ref 0.7–4.0)
MCH: 28.6 pg (ref 26.0–34.0)
MCHC: 32.7 g/dL (ref 30.0–36.0)
MCV: 87.6 fL (ref 80.0–100.0)
Monocytes Absolute: 0.5 10*3/uL (ref 0.1–1.0)
Monocytes Relative: 8 %
Neutro Abs: 2.8 10*3/uL (ref 1.7–7.7)
Neutrophils Relative %: 47 %
Platelet Count: 200 10*3/uL (ref 150–400)
RBC: 4.68 MIL/uL (ref 4.22–5.81)
RDW: 13.8 % (ref 11.5–15.5)
WBC Count: 6.1 10*3/uL (ref 4.0–10.5)
nRBC: 0 % (ref 0.0–0.2)

## 2022-10-17 LAB — CMP (CANCER CENTER ONLY)
ALT: 11 U/L (ref 0–44)
AST: 15 U/L (ref 15–41)
Albumin: 4 g/dL (ref 3.5–5.0)
Alkaline Phosphatase: 93 U/L (ref 38–126)
Anion gap: 8 (ref 5–15)
BUN: 17 mg/dL (ref 8–23)
CO2: 29 mmol/L (ref 22–32)
Calcium: 9.6 mg/dL (ref 8.9–10.3)
Chloride: 104 mmol/L (ref 98–111)
Creatinine: 1.12 mg/dL (ref 0.61–1.24)
GFR, Estimated: >60 mL/min (ref >60)
Glucose, Bld: 97 mg/dL (ref 70–99)
Potassium: 3.4 mmol/L — ABNORMAL LOW (ref 3.5–5.1)
Sodium: 141 mmol/L (ref 135–145)
Total Bilirubin: 0.5 mg/dL (ref 0.3–1.2)
Total Protein: 7 g/dL (ref 6.5–8.1)

## 2022-10-17 MED ORDER — SODIUM CHLORIDE 0.9% FLUSH
10.0000 mL | Freq: Once | INTRAVENOUS | Status: AC
  Administered 2022-10-17: 10 mL

## 2022-10-17 MED ORDER — HEPARIN SOD (PORK) LOCK FLUSH 100 UNIT/ML IV SOLN
500.0000 [IU] | Freq: Once | INTRAVENOUS | Status: AC
  Administered 2022-10-17: 500 [IU]

## 2022-10-21 NOTE — Progress Notes (Signed)
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 Clelia Croft.  10/22/2022: establish care with Dr. Leonides Schanz   Interval History:  Mario Hubbard 72 y.o. 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 The patient's last visit was on 04/12/2022 with Dr. Clelia Croft. In the interim since the last visit he has had no major changes in his health.  On exam today Mr. Furuya reports he has been well overall with no major changes in his health since his last visit with Dr. Clelia Croft in January 2024.  He reports that the weather is "too hot outside".  He notes he is having some issues with his sinuses but otherwise no infectious symptoms such as runny nose, cough, or sore throat.  He reports his energy is strong and his appetite is good.  He notes he has been cutting the grass and walking about 2-1/2 to 3 miles per day.  His weight has been steady and overall he feels well.  He has no questions comments or concerns today.  A full 10 point ROS is otherwise negative.  MEDICAL HISTORY:  Past Medical History:  Diagnosis Date   Arthritis    Hypertension     SURGICAL HISTORY: No past surgical history on file.  SOCIAL HISTORY: Social History   Socioeconomic History   Marital status: Married    Spouse name: Not on file   Number of children: Not on file   Years of education: Not on file   Highest education level: Associate degree: occupational, Scientist, product/process development, or vocational program  Occupational History   Not on file  Tobacco Use   Smoking status: Former   Smokeless tobacco: Never  Substance and Sexual Activity   Alcohol use: Never   Drug use: Never   Sexual activity: Not Currently    Partners: Female   Other Topics Concern   Not on file  Social History Narrative   Not on file   Social Determinants of Health   Financial Resource Strain: Low Risk  (09/09/2022)   Overall Financial Resource Strain (CARDIA)    Difficulty of Paying Living Expenses: Not hard at all  Food Insecurity: No Food Insecurity (09/09/2022)   Hunger Vital Sign    Worried About Running Out of Food in the Last Year: Never true    Ran Out of Food in the Last Year: Never true  Transportation Needs: No Transportation Needs (09/09/2022)   PRAPARE - Administrator, Civil Service (Medical): No    Lack of Transportation (Non-Medical): No  Physical Activity: Sufficiently Active (09/09/2022)   Exercise Vital Sign    Days of Exercise per Week: 7 days    Minutes of Exercise per Session: 30 min  Stress: No Stress Concern Present (09/09/2022)   [employer] of Occupational Health - Occupational Stress Questionnaire    Feeling of Stress : Not at all  Social Connections: Socially Integrated (09/09/2022)   Social Connection and Isolation Panel [NHANES]    Frequency of Communication with Friends and Family: More than three times a week    Frequency of Social Gatherings with Friends and Family: More than three times a week    Attends Religious Services: More than 4 times per year    Active Member of  Clubs or Organizations: Yes    Attends Banker Meetings: More than 4 times per year    Marital Status: Married  Catering manager Violence: Not At Risk (12/21/2021)   Humiliation, Afraid, Rape, and Kick questionnaire    Fear of Current or Ex-Partner: No    Emotionally Abused: No    Physically Abused: No    Sexually Abused: No    FAMILY HISTORY: Family History  Problem Relation Age of Onset   Kidney disease Mother    Hypertension Mother    Healthy Sister    Diabetes Brother     ALLERGIES:  has No Known Allergies.  MEDICATIONS:  Current Outpatient Medications  Medication Sig Dispense Refill   amLODipine  (NORVASC) 10 MG tablet Take 1 tablet (10 mg total) by mouth daily. 90 tablet 0   atorvastatin (LIPITOR) 20 MG tablet Take 1 tablet (20 mg total) by mouth daily. 90 tablet 0   indapamide (LOZOL) 1.25 MG tablet TAKE 1 TABLET BY MOUTH DAILY. 90 tablet 0   levocetirizine (XYZAL) 5 MG tablet Take 1 tablet (5 mg total) by mouth every evening. 90 tablet 1   metoprolol succinate (TOPROL-XL) 50 MG 24 hr tablet TAKE 1 TABLET BY MOUTH DAILY. TAKE WITH OR IMMEDIATELY FOLLOWING A MEAL. 90 tablet 0   potassium chloride SA (KLOR-CON M20) 20 MEQ tablet TAKE 1 TABLET BY MOUTH 3 TIMES DAILY. 270 tablet 0   predniSONE (STERAPRED UNI-PAK 21 TAB) 10 MG (21) TBPK tablet Take by mouth daily. Take 6 tabs by mouth daily  for 2 days, then 5 tabs for 2 days, then 4 tabs for 2 days, then 3 tabs for 2 days, 2 tabs for 2 days, then 1 tab by mouth daily for 2 days 42 tablet 0   sildenafil (VIAGRA) 50 MG tablet Take 1 tablet (50 mg total) by mouth daily as needed for erectile dysfunction. 10 tablet 11   No current facility-administered medications for this visit.    REVIEW OF SYSTEMS:   Constitutional: ( - ) fevers, ( - )  chills , ( - ) night sweats Eyes: ( - ) blurriness of vision, ( - ) double vision, ( - ) watery eyes Ears, nose, mouth, throat, and face: ( - ) mucositis, ( - ) sore throat Respiratory: ( - ) cough, ( - ) dyspnea, ( - ) wheezes Cardiovascular: ( - ) palpitation, ( - ) chest discomfort, ( - ) lower extremity swelling Gastrointestinal:  ( - ) nausea, ( - ) heartburn, ( - ) change in bowel habits Skin: ( - ) abnormal skin rashes Lymphatics: ( - ) new lymphadenopathy, ( - ) easy bruising Neurological: ( - ) numbness, ( - ) tingling, ( - ) new weaknesses Behavioral/Psych: ( - ) mood change, ( - ) new changes  All other systems were reviewed with the patient and are negative.  PHYSICAL EXAMINATION: ECOG PERFORMANCE STATUS: 0 - Asymptomatic  Vitals:   10/22/22 0828  BP: (!) 152/73  Pulse: (!) 55  Resp:  16  Temp: 97.6 F (36.4 C)  SpO2: 100%   Filed Weights   10/22/22 0828  Weight: 203 lb 9.6 oz (92.4 kg)    GENERAL: Well-appearing elderly African-American male, alert, no distress and comfortable SKIN: skin color, texture, turgor are normal, no rashes or significant lesions EYES: conjunctiva are pink and non-injected, sclera clear LUNGS: clear to auscultation and percussion with normal breathing effort HEART: regular rate & rhythm and no murmurs and no lower  extremity edema Musculoskeletal: no cyanosis of digits and no clubbing  PSYCH: alert & oriented x 3, fluent speech NEURO: no focal motor/sensory deficits  LABORATORY DATA:  I have reviewed the data as listed    Latest Ref Rng & Units 10/17/2022    7:46 AM 07/02/2022   11:25 AM 04/05/2022   10:25 AM  CBC  WBC 4.0 - 10.5 K/uL 6.1  8.3  5.9   Hemoglobin 13.0 - 17.0 g/dL 96.0  45.4  09.8   Hematocrit 39.0 - 52.0 % 41.0  42.4  39.0   Platelets 150 - 400 K/uL 200  257  188        Latest Ref Rng & Units 10/17/2022    7:46 AM 09/13/2022    8:52 AM 07/02/2022   11:25 AM  CMP  Glucose 70 - 99 mg/dL 97  119  147   BUN 8 - 23 mg/dL 17  13  36   Creatinine 0.61 - 1.24 mg/dL 8.29  5.62  1.30   Sodium 135 - 145 mmol/L 141  141  135   Potassium 3.5 - 5.1 mmol/L 3.4  3.7  3.5   Chloride 98 - 111 mmol/L 104  103  99   CO2 22 - 32 mmol/L 29  29  25    Calcium 8.9 - 10.3 mg/dL 9.6  9.8  9.2   Total Protein 6.5 - 8.1 g/dL 7.0     Total Bilirubin 0.3 - 1.2 mg/dL 0.5     Alkaline Phos 38 - 126 U/L 93     AST 15 - 41 U/L 15     ALT 0 - 44 U/L 11       RADIOGRAPHIC STUDIES: CT Chest Wo Contrast  Result Date: 10/22/2022 CLINICAL DATA:  Bladder cancer restaging, penile cancer, assess treatment response * Tracking Code: BO * EXAM: CT CHEST, ABDOMEN AND PELVIS WITHOUT CONTRAST TECHNIQUE: Multidetector CT imaging of the chest, abdomen and pelvis was performed following the standard protocol without IV contrast. RADIATION DOSE REDUCTION: This  exam was performed according to the departmental dose-optimization program which includes automated exposure control, adjustment of the mA and/or kV according to patient size and/or use of iterative reconstruction technique. COMPARISON:  04/06/2022 FINDINGS: CT CHEST FINDINGS Cardiovascular: Left chest port catheter. Aortic atherosclerosis. Normal heart size. Three-vessel coronary artery calcifications. No pericardial effusion. Mediastinum/Nodes: No enlarged mediastinal, hilar, or axillary lymph nodes. Small hiatal hernia. Thyroid gland, trachea, and esophagus demonstrate no significant findings. Lungs/Pleura: Mild centrilobular and paraseptal emphysema. Unchanged bandlike scarring of the apices and right midlung (series 506, image 69). No pleural effusion or pneumothorax. Musculoskeletal: No chest wall abnormality. No acute osseous findings. CT ABDOMEN PELVIS FINDINGS Hepatobiliary: No solid liver abnormality is seen. No gallstones, gallbladder wall thickening, or biliary dilatation. Pancreas: Unremarkable. No pancreatic ductal dilatation or surrounding inflammatory changes. Spleen: Normal in size without significant abnormality. Adrenals/Urinary Tract: Adrenal glands are unremarkable. Simple, benign left renal cortical cyst, for which no further follow-up or characterization is required. Kidneys are otherwise normal, without renal calculi, solid lesion, or hydronephrosis. Bladder is unremarkable. Stomach/Bowel: Stomach is within normal limits. Appendix appears normal. No evidence of bowel wall thickening, distention, or inflammatory changes. Descending colonic diverticula. Vascular/Lymphatic: Aortic atherosclerosis. No enlarged abdominal or pelvic lymph nodes. Surgical clips in the left groin (series 504, image 117). Reproductive: Prostatomegaly. Other: Fat containing umbilical hernia.  No ascites. Musculoskeletal: No acute osseous findings. IMPRESSION: 1. No noncontrast evidence of metastatic disease in the  chest, abdomen, or pelvis.  2. Emphysema. 3. Prostatomegaly. 4. Coronary artery disease. Aortic Atherosclerosis (ICD10-I70.0) and Emphysema (ICD10-J43.9). Electronically Signed   By: Jearld Lesch M.D.   On: 10/22/2022 08:42   CT Abdomen Pelvis Wo Contrast  Result Date: 10/22/2022 CLINICAL DATA:  Bladder cancer restaging, penile cancer, assess treatment response * Tracking Code: BO * EXAM: CT CHEST, ABDOMEN AND PELVIS WITHOUT CONTRAST TECHNIQUE: Multidetector CT imaging of the chest, abdomen and pelvis was performed following the standard protocol without IV contrast. RADIATION DOSE REDUCTION: This exam was performed according to the departmental dose-optimization program which includes automated exposure control, adjustment of the mA and/or kV according to patient size and/or use of iterative reconstruction technique. COMPARISON:  04/06/2022 FINDINGS: CT CHEST FINDINGS Cardiovascular: Left chest port catheter. Aortic atherosclerosis. Normal heart size. Three-vessel coronary artery calcifications. No pericardial effusion. Mediastinum/Nodes: No enlarged mediastinal, hilar, or axillary lymph nodes. Small hiatal hernia. Thyroid gland, trachea, and esophagus demonstrate no significant findings. Lungs/Pleura: Mild centrilobular and paraseptal emphysema. Unchanged bandlike scarring of the apices and right midlung (series 506, image 69). No pleural effusion or pneumothorax. Musculoskeletal: No chest wall abnormality. No acute osseous findings. CT ABDOMEN PELVIS FINDINGS Hepatobiliary: No solid liver abnormality is seen. No gallstones, gallbladder wall thickening, or biliary dilatation. Pancreas: Unremarkable. No pancreatic ductal dilatation or surrounding inflammatory changes. Spleen: Normal in size without significant abnormality. Adrenals/Urinary Tract: Adrenal glands are unremarkable. Simple, benign left renal cortical cyst, for which no further follow-up or characterization is required. Kidneys are otherwise normal,  without renal calculi, solid lesion, or hydronephrosis. Bladder is unremarkable. Stomach/Bowel: Stomach is within normal limits. Appendix appears normal. No evidence of bowel wall thickening, distention, or inflammatory changes. Descending colonic diverticula. Vascular/Lymphatic: Aortic atherosclerosis. No enlarged abdominal or pelvic lymph nodes. Surgical clips in the left groin (series 504, image 117). Reproductive: Prostatomegaly. Other: Fat containing umbilical hernia.  No ascites. Musculoskeletal: No acute osseous findings. IMPRESSION: 1. No noncontrast evidence of metastatic disease in the chest, abdomen, or pelvis. 2. Emphysema. 3. Prostatomegaly. 4. Coronary artery disease. Aortic Atherosclerosis (ICD10-I70.0) and Emphysema (ICD10-J43.9). Electronically Signed   By: Jearld Lesch M.D.   On: 10/22/2022 08:42    ASSESSMENT & PLAN Mario Hubbard 72 y.o. 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 Recommend CT scans q 6 months moving forward.  --labs today show WBC 6.1, Hgb 13.4, MCV 87.6, Plt 200 -- recommend RTC in 6 months time with repeat CT scan.   # Port-A-Cath --remains in place, recommend removal.  --patient is in agreement to have port removed    Orders Placed This Encounter  Procedures   CT CHEST ABDOMEN PELVIS WO CONTRAST    Standing Status:   Future    Standing Expiration Date:   04/15/2023    Order Specific Question:   Preferred imaging location?    Answer:   [facility]    Order Specific Question:   If indicated for the ordered procedure, I authorize the administration of oral contrast media per Radiology protocol    Answer:   Yes    Order Specific Question:   Does the patient have a contrast media/X-ray  dye allergy?    Answer:   No    All questions were answered. The patient knows to call the  clinic with any problems, questions or concerns.  A total of more than 40 minutes were spent on this encounter with face-to-face time and non-face-to-face time, including preparing to see the patient, ordering tests and/or medications, counseling the patient and coordination of care as outlined above.   Ulysees Barns, MD Department of Hematology/Oncology [facility] Phone: [phone] Pager: [phone] Email: [email or web address]  10/22/2022 9:51 AM

## 2022-10-22 ENCOUNTER — Telehealth: Payer: Self-pay | Admitting: *Deleted

## 2022-10-22 ENCOUNTER — Inpatient Hospital Stay (HOSPITAL_BASED_OUTPATIENT_CLINIC_OR_DEPARTMENT_OTHER): Payer: Medicare Other | Admitting: Hematology and Oncology

## 2022-10-22 ENCOUNTER — Other Ambulatory Visit: Payer: Self-pay

## 2022-10-22 VITALS — BP 152/73 | HR 55 | Temp 97.6°F | Resp 16 | Wt 203.6 lb

## 2022-10-22 DIAGNOSIS — Z95828 Presence of other vascular implants and grafts: Secondary | ICD-10-CM

## 2022-10-22 DIAGNOSIS — C680 Malignant neoplasm of urethra: Secondary | ICD-10-CM | POA: Diagnosis not present

## 2022-10-22 DIAGNOSIS — Z8551 Personal history of malignant neoplasm of bladder: Secondary | ICD-10-CM | POA: Diagnosis not present

## 2022-10-22 NOTE — Telephone Encounter (Signed)
TCT patient regarding recent CT scan results.  Spoke with pt. Advised that his CT scan does not show any evidence of residual or recurrent urothelial carcinoma.  He remains in a complete remission.  Will plan to see him back in January 2025 with repeat scans. Pt voiced understanding. He is aware of f/u in January

## 2022-10-22 NOTE — Telephone Encounter (Signed)
-----   Message from Ulysees Barns IV sent at 10/22/2022  9:52 AM EDT ----- Please let Mr. Larke know that his CT scan does not show any evidence of residual or recurrent urothelial carcinoma.  He remains in a complete remission.  Will plan to see him back in January 2025 with repeat scans.

## 2022-12-01 ENCOUNTER — Other Ambulatory Visit: Payer: Self-pay | Admitting: Internal Medicine

## 2022-12-01 DIAGNOSIS — E785 Hyperlipidemia, unspecified: Secondary | ICD-10-CM

## 2022-12-01 DIAGNOSIS — I10 Essential (primary) hypertension: Secondary | ICD-10-CM

## 2022-12-13 ENCOUNTER — Other Ambulatory Visit: Payer: Self-pay | Admitting: Internal Medicine

## 2022-12-13 DIAGNOSIS — I10 Essential (primary) hypertension: Secondary | ICD-10-CM

## 2022-12-23 ENCOUNTER — Other Ambulatory Visit: Payer: Self-pay | Admitting: Internal Medicine

## 2022-12-23 DIAGNOSIS — J301 Allergic rhinitis due to pollen: Secondary | ICD-10-CM

## 2022-12-24 ENCOUNTER — Ambulatory Visit (INDEPENDENT_AMBULATORY_CARE_PROVIDER_SITE_OTHER): Payer: Medicare Other

## 2022-12-24 VITALS — Ht 69.0 in | Wt 205.0 lb

## 2022-12-24 DIAGNOSIS — Z0000 Encounter for general adult medical examination without abnormal findings: Secondary | ICD-10-CM | POA: Diagnosis not present

## 2022-12-24 NOTE — Progress Notes (Addendum)
Subjective:   Mario Hubbard is a 72 y.o. male who presents for Medicare Annual/Subsequent preventive examination.  Visit Complete: Virtual  I connected with  Mario Hubbard on 12/24/22 by a audio enabled telemedicine application and verified that I am speaking with the correct person using two identifiers.  Patient Location: Home  Provider Location: Office/Clinic  I discussed the limitations of evaluation and management by telemedicine. The patient expressed understanding and agreed to proceed.  Patient Medicare AWV questionnaire was completed by the patient on 12/20/2022; I have confirmed that all information answered by patient is correct and no changes since this date.  Patient provided his/her weight during this virtual phone visit.  Vital Signs: Because this visit was a virtual/telehealth visit, some criteria may be missing or patient reported. Any vitals not documented were not able to be obtained and vitals that have been documented are patient reported.    Cardiac Risk Factors include: advanced age (>62men, >64 women);dyslipidemia;hypertension;male gender;obesity (BMI >30kg/m2);family history of premature cardiovascular disease     Objective:    Today's Vitals   12/24/22 1104  Weight: 205 lb (93 kg)  Height: 5\' 9"  (1.753 m)  PainSc: 0-No pain   Body mass index is 30.27 kg/m.     12/24/2022   11:05 AM 07/02/2022   10:20 AM 12/21/2021   10:42 AM 09/20/2021    8:51 AM 08/10/2021    9:27 AM 02/01/2021    9:03 AM 12/20/2020    5:12 PM  Advanced Directives  Does Patient Have a Medical Advance Directive? No No No No No No No  Would patient like information on creating a medical advance directive? No - Patient declined  No - Patient declined No - Patient declined No - Patient declined No - Patient declined Yes (ED - Information included in AVS)    Current Medications (verified) Outpatient Encounter Medications as of 12/24/2022  Medication Sig   amLODipine (NORVASC) 10 MG  tablet TAKE 1 TABLET BY MOUTH EVERY DAY   atorvastatin (LIPITOR) 20 MG tablet TAKE 1 TABLET BY MOUTH EVERY DAY   indapamide (LOZOL) 1.25 MG tablet TAKE 1 TABLET BY MOUTH EVERY DAY   levocetirizine (XYZAL) 5 MG tablet TAKE 1 TABLET BY MOUTH EVERY DAY IN THE EVENING   metoprolol succinate (TOPROL-XL) 50 MG 24 hr tablet TAKE 1 TABLET BY MOUTH EVERY DAY WITH OR IMMEDIATELY FOLLOWING A MEAL   potassium chloride SA (KLOR-CON M20) 20 MEQ tablet TAKE 1 TABLET BY MOUTH 3 TIMES DAILY.   predniSONE (STERAPRED UNI-PAK 21 TAB) 10 MG (21) TBPK tablet Take by mouth daily. Take 6 tabs by mouth daily  for 2 days, then 5 tabs for 2 days, then 4 tabs for 2 days, then 3 tabs for 2 days, 2 tabs for 2 days, then 1 tab by mouth daily for 2 days   sildenafil (VIAGRA) 50 MG tablet Take 1 tablet (50 mg total) by mouth daily as needed for erectile dysfunction.   No facility-administered encounter medications on file as of 12/24/2022.    Allergies (verified) Patient has no known allergies.   History: Past Medical History:  Diagnosis Date   Arthritis    Hypertension    History reviewed. No pertinent surgical history. Family History  Problem Relation Age of Onset   Kidney disease Mother    Hypertension Mother    Healthy Sister    Diabetes Brother    Social History   Socioeconomic History   Marital status: Married    Spouse name: Not  on file   Number of children: Not on file   Years of education: Not on file   Highest education level: Associate degree: occupational, Scientist, product/process development, or vocational program  Occupational History   Not on file  Tobacco Use   Smoking status: Former   Smokeless tobacco: Never  Substance and Sexual Activity   Alcohol use: Never   Drug use: Never   Sexual activity: Not Currently    Partners: Female  Other Topics Concern   Not on file  Social History Narrative   Not on file   Social Determinants of Health   Financial Resource Strain: Low Risk  (12/24/2022)   Overall Financial  Resource Strain (CARDIA)    Difficulty of Paying Living Expenses: Not hard at all  Food Insecurity: No Food Insecurity (12/24/2022)   Hunger Vital Sign    Worried About Running Out of Food in the Last Year: Never true    Ran Out of Food in the Last Year: Never true  Transportation Needs: No Transportation Needs (12/24/2022)   PRAPARE - Administrator, Civil Service (Medical): No    Lack of Transportation (Non-Medical): No  Physical Activity: Sufficiently Active (12/24/2022)   Exercise Vital Sign    Days of Exercise per Week: 7 days    Minutes of Exercise per Session: 80 min  Recent Concern: Physical Activity - Insufficiently Active (12/20/2022)   Exercise Vital Sign    Days of Exercise per Week: 1 day    Minutes of Exercise per Session: 80 min  Stress: No Stress Concern Present (12/24/2022)   [employer] of Occupational Health - Occupational Stress Questionnaire    Feeling of Stress : Not at all  Social Connections: Unknown (12/24/2022)   Social Connection and Isolation Panel [NHANES]    Frequency of Communication with Friends and Family: More than three times a week    Frequency of Social Gatherings with Friends and Family: More than three times a week    Attends Religious Services: Patient unable to answer    Active Member of Clubs or Organizations: Yes    Attends Engineer, structural: More than 4 times per year    Marital Status: Married    Tobacco Counseling Counseling given: Not Answered   Clinical Intake:  Pre-visit preparation completed: Yes  Pain : No/denies pain Pain Score: 0-No pain     BMI - recorded: 30.27 Nutritional Status: BMI > 30  Obese Nutritional Risks: None Diabetes: No  How often do you need to have someone help you when you read instructions, pamphlets, or other written materials from your doctor or pharmacy?: 1 - Never What is the last grade level you completed in school?: COLLEGE GRADUATE  Interpreter Needed?:  No  Information entered by :: Mario Titsworth N. Oley Lahaie, LPN.   Activities of Daily Living    12/24/2022   11:08 AM 12/20/2022    8:44 PM  In your present state of health, do you have any difficulty performing the following activities:  Hearing? 0 0  Vision? 0 0  Difficulty concentrating or making decisions? 0 0  Walking or climbing stairs? 0 0  Dressing or bathing? 0 0  Doing errands, shopping? 0 0  Preparing Food and eating ? N N  Using the Toilet? N N  In the past six months, have you accidently leaked urine? N N  Do you have problems with loss of bowel control? N N  Managing your Medications? N N  Managing your Finances? N N  Housekeeping or managing your Housekeeping? N N    Patient Care Team: Etta Grandchild, MD as PCP - General (Internal Medicine)  Indicate any recent Medical Services you may have received from other than [facility] providers in the past year (date may be approximate).     Assessment:   This is a routine wellness examination for Mario Hubbard.  Hearing/Vision screen Hearing Screening - Comments:: Patient denied any hearing difficulty.   No hearing aids.  Vision Screening - Comments:: Patient does wear corrective lenses/contacts.  Annual eye exam done by: [facility]    Goals Addressed             This Visit's Progress    My healthcare goal for 2024-2025 is to maintain my current health status by continuing to eat healthy, stay independent, physically and socially active.        Depression Screen    12/24/2022   11:07 AM 07/03/2022    2:43 PM 12/21/2021   11:52 AM 12/21/2021   10:49 AM 09/13/2021    3:43 PM 09/13/2021    3:23 PM 12/20/2020    5:08 PM  PHQ 2/9 Scores  PHQ - 2 Score 0 0 0 0 0 0 0  PHQ- 9 Score 0     0     Fall Risk    12/24/2022   11:07 AM 12/20/2022    8:44 PM 07/03/2022    2:42 PM 12/21/2021   10:44 AM 09/13/2021    3:43 PM  Fall Risk   Falls in the past year? 0 0 0 0 0  Number falls in past yr: 0 0 0 0 0  Injury with Fall? 0  0 0 0 0  Risk for fall due to : No Fall Risks  No Fall Risks No Fall Risks   Follow up Falls prevention discussed  Falls evaluation completed Falls prevention discussed     MEDICARE RISK AT HOME: Medicare Risk at Home Any stairs in or around the home?: No If so, are there any without handrails?: No Home free of loose throw rugs in walkways, pet beds, electrical cords, etc?: Yes Adequate lighting in your home to reduce risk of falls?: Yes Life alert?: No Use of a cane, walker or w/c?: No Grab bars in the bathroom?: No Shower chair or bench in shower?: No Elevated toilet seat or a handicapped toilet?: No  TIMED UP AND GO:  Was the test performed?  No    Cognitive Function:        12/24/2022   11:08 AM 12/21/2021   10:49 AM 12/20/2020    5:10 PM  6CIT Screen  What Year? 0 points 0 points 0 points  What month? 0 points 0 points 0 points  What time? 0 points 0 points 0 points  Count back from 20 0 points 0 points 0 points  Months in reverse 0 points 0 points 0 points  Repeat phrase 0 points 0 points 2 points  Total Score 0 points 0 points 2 points    Immunizations Immunization History  Administered Date(s) Administered   Fluad Quad(high Dose 65+) 12/28/2020   Influenza, High Dose Seasonal PF 01/17/2022   Influenza-Unspecified 01/05/2020   Moderna SARS-COV2 Booster Vaccination 01/17/2022   PFIZER(Purple Top)SARS-COV-2 Vaccination 05/20/2019, 06/17/2019, 03/22/2020   Pneumococcal Conjugate-13 10/09/2016   Pneumococcal Polysaccharide-23 01/17/2018   Tdap 05/14/2017    TDAP status: Up to date  Flu Vaccine status: Due, Education has been provided regarding the importance of this vaccine. Advised may  receive this vaccine at local pharmacy or Health Dept. Aware to provide a copy of the vaccination record if obtained from local pharmacy or Health Dept. Verbalized acceptance and understanding.  Pneumococcal vaccine status: Up to date  Covid-19 vaccine status: Completed  vaccines  Qualifies for Shingles Vaccine? Yes   Zostavax completed No   Shingrix Completed?: No.    Education has been provided regarding the importance of this vaccine. Patient has been advised to call insurance company to determine out of pocket expense if they have not yet received this vaccine. Advised may also receive vaccine at local pharmacy or Health Dept. Verbalized acceptance and understanding.  Screening Tests Health Maintenance  Topic Date Due   Hepatitis C Screening  Never done   Zoster Vaccines- Shingrix (1 of 2) Never done   INFLUENZA VACCINE  11/08/2022   COVID-19 Vaccine (4 - 2023-24 season) 12/09/2022   Medicare Annual Wellness (AWV)  12/24/2023   Colonoscopy  10/19/2024   DTaP/Tdap/Td (2 - Td or Tdap) 05/15/2027   Pneumonia Vaccine 10+ Years old  Completed   HPV VACCINES  Aged Out    Health Maintenance  Health Maintenance Due  Topic Date Due   Hepatitis C Screening  Never done   Zoster Vaccines- Shingrix (1 of 2) Never done   INFLUENZA VACCINE  11/08/2022   COVID-19 Vaccine (4 - 2023-24 season) 12/09/2022    Colorectal cancer screening: Type of screening: Colonoscopy. Completed 10/20/2014. Repeat every 10 years  Lung Cancer Screening: (Low Dose CT Chest recommended if Age 41-80 years, 20 pack-year currently smoking OR have quit w/in 15years.) does not qualify.   Lung Cancer Screening Referral: no  Additional Screening:  Hepatitis C Screening: does qualify; Completed: no  Vision Screening: Recommended annual ophthalmology exams for early detection of glaucoma and other disorders of the eye. Is the patient up to date with their annual eye exam?  Yes  Who is the provider or what is the name of the office in which the patient attends annual eye exams? [facility] If pt is not established with a provider, would they like to be referred to a provider to establish care? No .   Dental Screening: Recommended annual dental exams for proper oral  hygiene  Diabetic Foot Exam: N/A  Community Resource Referral / Chronic Care Management: CRR required this visit?  No   CCM required this visit?  No     Plan:     I have personally reviewed and noted the following in the patient's chart:   Medical and social history Use of alcohol, tobacco or illicit drugs  Current medications and supplements including opioid prescriptions. Patient is not currently taking opioid prescriptions. Functional ability and status Nutritional status Physical activity Advanced directives List of other physicians Hospitalizations, surgeries, and ER visits in previous 12 months Vitals Screenings to include cognitive, depression, and falls Referrals and appointments  In addition, I have reviewed and discussed with patient certain preventive protocols, quality metrics, and best practice recommendations. A written personalized care plan for preventive services as well as general preventive health recommendations were provided to patient.     Mickeal Needy, LPN   [id number]   After Visit Summary: (Mail) Due to this being a telephonic visit, the after visit summary with patients personalized plan was offered to patient via mail   Nurse Notes: Normal cognitive status assessed by direct observation via telephone conversation by this Nurse Health Advisor. No abnormalities found.

## 2022-12-24 NOTE — Patient Instructions (Signed)
Mario Hubbard , Thank you for taking time to come for your Medicare Wellness Visit. I appreciate your ongoing commitment to your health goals. Please review the following plan we discussed and let me know if I can assist you in the future.   Referrals/Orders/Follow-Ups/Clinician Recommendations: No  This is a list of the screening recommended for you and due dates:  Health Maintenance  Topic Date Due   Hepatitis C Screening  Never done   Zoster (Shingles) Vaccine (1 of 2) Never done   Flu Shot  11/08/2022   COVID-19 Vaccine (4 - 2023-24 season) 12/09/2022   Medicare Annual Wellness Visit  12/24/2023   Colon Cancer Screening  10/19/2024   DTaP/Tdap/Td vaccine (2 - Td or Tdap) 05/15/2027   Pneumonia Vaccine  Completed   HPV Vaccine  Aged Out    Advanced directives: Patient is currently in the process of completing Advanced Directives with an attorney.  Next Medicare Annual Wellness Visit scheduled for next year: Yes

## 2022-12-27 ENCOUNTER — Other Ambulatory Visit: Payer: Self-pay | Admitting: Internal Medicine

## 2022-12-27 DIAGNOSIS — I10 Essential (primary) hypertension: Secondary | ICD-10-CM

## 2022-12-27 DIAGNOSIS — E876 Hypokalemia: Secondary | ICD-10-CM

## 2023-01-15 ENCOUNTER — Telehealth: Payer: Self-pay

## 2023-01-15 ENCOUNTER — Ambulatory Visit (INDEPENDENT_AMBULATORY_CARE_PROVIDER_SITE_OTHER): Payer: Medicare Other | Admitting: Internal Medicine

## 2023-01-15 ENCOUNTER — Encounter: Payer: Self-pay | Admitting: Internal Medicine

## 2023-01-15 VITALS — BP 136/74 | HR 55 | Temp 97.9°F | Ht 69.0 in | Wt 206.0 lb

## 2023-01-15 DIAGNOSIS — I10 Essential (primary) hypertension: Secondary | ICD-10-CM

## 2023-01-15 DIAGNOSIS — Z23 Encounter for immunization: Secondary | ICD-10-CM

## 2023-01-15 DIAGNOSIS — R9431 Abnormal electrocardiogram [ECG] [EKG]: Secondary | ICD-10-CM

## 2023-01-15 DIAGNOSIS — N1831 Chronic kidney disease, stage 3a: Secondary | ICD-10-CM | POA: Diagnosis not present

## 2023-01-15 DIAGNOSIS — R001 Bradycardia, unspecified: Secondary | ICD-10-CM | POA: Diagnosis not present

## 2023-01-15 DIAGNOSIS — E876 Hypokalemia: Secondary | ICD-10-CM | POA: Diagnosis not present

## 2023-01-15 DIAGNOSIS — Z1159 Encounter for screening for other viral diseases: Secondary | ICD-10-CM

## 2023-01-15 LAB — MAGNESIUM: Magnesium: 1.6 mg/dL (ref 1.5–2.5)

## 2023-01-15 LAB — BASIC METABOLIC PANEL
BUN: 16 mg/dL (ref 6–23)
CO2: 27 meq/L (ref 19–32)
Calcium: 10.1 mg/dL (ref 8.4–10.5)
Chloride: 102 meq/L (ref 96–112)
Creatinine, Ser: 1.1 mg/dL (ref 0.40–1.50)
GFR: 66.57 mL/min (ref >60.00)
Glucose, Bld: 129 mg/dL — ABNORMAL HIGH (ref 70–99)
Potassium: 3.1 meq/L — ABNORMAL LOW (ref 3.5–5.1)
Sodium: 138 meq/L (ref 135–145)

## 2023-01-15 MED ORDER — SHINGRIX 50 MCG/0.5ML IM SUSR
0.5000 mL | Freq: Once | INTRAMUSCULAR | 1 refills | Status: AC
Start: 2023-01-15 — End: 2023-01-15

## 2023-01-15 MED ORDER — SPIRONOLACTONE 25 MG PO TABS
25.0000 mg | ORAL_TABLET | Freq: Every day | ORAL | 0 refills | Status: DC
Start: 2023-01-15 — End: 2023-04-02

## 2023-01-15 NOTE — Patient Instructions (Signed)
Hypertension, Adult High blood pressure (hypertension) is when the force of blood pumping through the arteries is too strong. The arteries are the blood vessels that carry blood from the heart throughout the body. Hypertension forces the heart to work harder to pump blood and may cause arteries to become narrow or stiff. Untreated or uncontrolled hypertension can lead to a heart attack, heart failure, a stroke, kidney disease, and other problems. A blood pressure reading consists of a higher number over a lower number. Ideally, your blood pressure should be below 120/80. The first ("top") number is called the systolic pressure. It is a measure of the pressure in your arteries as your heart beats. The second ("bottom") number is called the diastolic pressure. It is a measure of the pressure in your arteries as the heart relaxes. What are the causes? The exact cause of this condition is not known. There are some conditions that result in high blood pressure. What increases the risk? Certain factors may make you more likely to develop high blood pressure. Some of these risk factors are under your control, including: Smoking. Not getting enough exercise or physical activity. Being overweight. Having too much fat, sugar, calories, or salt (sodium) in your diet. Drinking too much alcohol. Other risk factors include: Having a personal history of heart disease, diabetes, high cholesterol, or kidney disease. Stress. Having a family history of high blood pressure and high cholesterol. Having obstructive sleep apnea. Age. The risk increases with age. What are the signs or symptoms? High blood pressure may not cause symptoms. Very high blood pressure (hypertensive crisis) may cause: Headache. Fast or irregular heartbeats (palpitations). Shortness of breath. Nosebleed. Nausea and vomiting. Vision changes. Severe chest pain, dizziness, and seizures. How is this diagnosed? This condition is diagnosed by  measuring your blood pressure while you are seated, with your arm resting on a flat surface, your legs uncrossed, and your feet flat on the floor. The cuff of the blood pressure monitor will be placed directly against the skin of your upper arm at the level of your heart. Blood pressure should be measured at least twice using the same arm. Certain conditions can cause a difference in blood pressure between your right and left arms. If you have a high blood pressure reading during one visit or you have normal blood pressure with other risk factors, you may be asked to: Return on a different day to have your blood pressure checked again. Monitor your blood pressure at home for 1 week or longer. If you are diagnosed with hypertension, you may have other blood or imaging tests to help your health care provider understand your overall risk for other conditions. How is this treated? This condition is treated by making healthy lifestyle changes, such as eating healthy foods, exercising more, and reducing your alcohol intake. You may be referred for counseling on a healthy diet and physical activity. Your health care provider may prescribe medicine if lifestyle changes are not enough to get your blood pressure under control and if: Your systolic blood pressure is above 130. Your diastolic blood pressure is above 80. Your personal target blood pressure may vary depending on your medical conditions, your age, and other factors. Follow these instructions at home: Eating and drinking  Eat a diet that is high in fiber and potassium, and low in sodium, added sugar, and fat. An example of this eating plan is called the DASH diet. DASH stands for Dietary Approaches to Stop Hypertension. To eat this way: Eat   plenty of fresh fruits and vegetables. Try to fill one half of your plate at each meal with fruits and vegetables. Eat whole grains, such as whole-wheat pasta, brown rice, or whole-grain bread. Fill about one  fourth of your plate with whole grains. Eat or drink low-fat dairy products, such as skim milk or low-fat yogurt. Avoid fatty cuts of meat, processed or cured meats, and poultry with skin. Fill about one fourth of your plate with lean proteins, such as fish, chicken without skin, beans, eggs, or tofu. Avoid pre-made and processed foods. These tend to be higher in sodium, added sugar, and fat. Reduce your daily sodium intake. Many people with hypertension should eat less than 1,500 mg of sodium a day. Do not drink alcohol if: Your health care provider tells you not to drink. You are pregnant, may be pregnant, or are planning to become pregnant. If you drink alcohol: Limit how much you have to: 0-1 drink a day for women. 0-2 drinks a day for men. Know how much alcohol is in your drink. In the [place] one drink equals one 12 oz bottle of beer (355 mL), one 5 oz glass of wine (148 mL), or one 1 oz glass of hard liquor (44 mL). Lifestyle  Work with your health care provider to maintain a healthy body weight or to lose weight. Ask what an ideal weight is for you. Get at least 30 minutes of exercise that causes your heart to beat faster (aerobic exercise) most days of the week. Activities may include walking, swimming, or biking. Include exercise to strengthen your muscles (resistance exercise), such as Pilates or lifting weights, as part of your weekly exercise routine. Try to do these types of exercises for 30 minutes at least 3 days a week. Do not use any products that contain nicotine or tobacco. These products include cigarettes, chewing tobacco, and vaping devices, such as e-cigarettes. If you need help quitting, ask your health care provider. Monitor your blood pressure at home as told by your health care provider. Keep all follow-up visits. This is important. Medicines Take over-the-counter and prescription medicines only as told by your health care provider. Follow directions carefully. Blood  pressure medicines must be taken as prescribed. Do not skip doses of blood pressure medicine. Doing this puts you at risk for problems and can make the medicine less effective. Ask your health care provider about side effects or reactions to medicines that you should watch for. Contact a health care provider if you: Think you are having a reaction to a medicine you are taking. Have headaches that keep coming back (recurring). Feel dizzy. Have swelling in your ankles. Have trouble with your vision. Get help right away if you: Develop a severe headache or confusion. Have unusual weakness or numbness. Feel faint. Have severe pain in your chest or abdomen. Vomit repeatedly. Have trouble breathing. These symptoms may be an emergency. Get help right away. Call 911. Do not wait to see if the symptoms will go away. Do not drive yourself to the hospital. Summary Hypertension is when the force of blood pumping through your arteries is too strong. If this condition is not controlled, it may put you at risk for serious complications. Your personal target blood pressure may vary depending on your medical conditions, your age, and other factors. For most people, a normal blood pressure is less than 120/80. Hypertension is treated with lifestyle changes, medicines, or a combination of both. Lifestyle changes include losing weight, eating a healthy,   low-sodium diet, exercising more, and limiting alcohol. This information is not intended to replace advice given to you by your health care provider. Make sure you discuss any questions you have with your health care provider. Document Revised: 01/31/2021 Document Reviewed: 01/31/2021 Elsevier Patient Education  2024 [employer]  

## 2023-01-15 NOTE — Progress Notes (Unsigned)
Subjective:  Patient ID: Mario Hubbard, male    DOB: 07/16/1950  Age: 72 y.o. MRN: [id number]  CC: Hypertension   HPI Mario Hubbard presents for f/up -----   Discussed the use of AI scribe software for clinical note transcription with the patient, who gave verbal consent to proceed.  History of Present Illness   The patient, with a history of hypertension and sinus issues, presents for a routine check-up. He reports no symptoms of bradycardia despite a heart rate of 55, denying any dizziness, lightheadedness, weakness, or pre-syncope. He also denies any symptoms of hypertension such as headaches or blurred vision, and there is no reported edema in the legs or feet.       Outpatient Medications Prior to Visit  Medication Sig Dispense Refill   amLODipine (NORVASC) 10 MG tablet TAKE 1 TABLET BY MOUTH EVERY DAY 90 tablet 0   atorvastatin (LIPITOR) 20 MG tablet TAKE 1 TABLET BY MOUTH EVERY DAY 90 tablet 0   levocetirizine (XYZAL) 5 MG tablet TAKE 1 TABLET BY MOUTH EVERY DAY IN THE EVENING 90 tablet 1   metoprolol succinate (TOPROL-XL) 50 MG 24 hr tablet TAKE 1 TABLET BY MOUTH EVERY DAY WITH OR IMMEDIATELY FOLLOWING A MEAL 90 tablet 0   potassium chloride SA (KLOR-CON M20) 20 MEQ tablet TAKE 1 TABLET BY MOUTH THREE TIMES A DAY 90 tablet 0   sildenafil (VIAGRA) 50 MG tablet Take 1 tablet (50 mg total) by mouth daily as needed for erectile dysfunction. 10 tablet 11   indapamide (LOZOL) 1.25 MG tablet TAKE 1 TABLET BY MOUTH EVERY DAY 90 tablet 0   predniSONE (STERAPRED UNI-PAK 21 TAB) 10 MG (21) TBPK tablet Take by mouth daily. Take 6 tabs by mouth daily  for 2 days, then 5 tabs for 2 days, then 4 tabs for 2 days, then 3 tabs for 2 days, 2 tabs for 2 days, then 1 tab by mouth daily for 2 days 42 tablet 0   No facility-administered medications prior to visit.    ROS Review of Systems  Objective:  BP 136/74 (BP Location: Left Arm, Patient Position: Sitting, Cuff Size: Large)   Pulse (!) 55    Temp 97.9 F (36.6 C) (Oral)   Ht 5\' 9"  (1.753 m)   Wt 206 lb (93.4 kg)   SpO2 95%   BMI 30.42 kg/m   BP Readings from Last 3 Encounters:  01/15/23 136/74  10/22/22 (!) 152/73  09/13/22 138/76    Wt Readings from Last 3 Encounters:  01/15/23 206 lb (93.4 kg)  12/24/22 205 lb (93 kg)  10/22/22 203 lb 9.6 oz (92.4 kg)    Physical Exam Cardiovascular:     Rate and Rhythm: Regular rhythm. Bradycardia present. Occasional Extrasystoles are present. Chest:     Comments: EKG- SB with PAC's, 53 bpm Septal infarct pattern is more prominent No LVH or acute ST/T wave changes    Lab Results  Component Value Date   WBC 6.1 10/17/2022   HGB 13.4 10/17/2022   HCT 41.0 10/17/2022   PLT 200 10/17/2022   GLUCOSE 129 (H) 01/15/2023   CHOL 147 09/13/2022   TRIG 140.0 09/13/2022   HDL 39.90 09/13/2022   LDLCALC 79 09/13/2022   ALT 11 10/17/2022   AST 15 10/17/2022   NA 138 01/15/2023   K 3.1 (L) 01/15/2023   CL 102 01/15/2023   CREATININE 1.10 01/15/2023   BUN 16 01/15/2023   CO2 27 01/15/2023   TSH 1.327 04/05/2022  PSA 3.05 09/13/2022   HGBA1C 5.6 09/13/2022    CT Chest Wo Contrast  Result Date: 10/22/2022 CLINICAL DATA:  Bladder cancer restaging, penile cancer, assess treatment response * Tracking Code: BO * EXAM: CT CHEST, ABDOMEN AND PELVIS WITHOUT CONTRAST TECHNIQUE: Multidetector CT imaging of the chest, abdomen and pelvis was performed following the standard protocol without IV contrast. RADIATION DOSE REDUCTION: This exam was performed according to the departmental dose-optimization program which includes automated exposure control, adjustment of the mA and/or kV according to patient size and/or use of iterative reconstruction technique. COMPARISON:  04/06/2022 FINDINGS: CT CHEST FINDINGS Cardiovascular: Left chest port catheter. Aortic atherosclerosis. Normal heart size. Three-vessel coronary artery calcifications. No pericardial effusion. Mediastinum/Nodes: No enlarged  mediastinal, hilar, or axillary lymph nodes. Small hiatal hernia. Thyroid gland, trachea, and esophagus demonstrate no significant findings. Lungs/Pleura: Mild centrilobular and paraseptal emphysema. Unchanged bandlike scarring of the apices and right midlung (series 506, image 69). No pleural effusion or pneumothorax. Musculoskeletal: No chest wall abnormality. No acute osseous findings. CT ABDOMEN PELVIS FINDINGS Hepatobiliary: No solid liver abnormality is seen. No gallstones, gallbladder wall thickening, or biliary dilatation. Pancreas: Unremarkable. No pancreatic ductal dilatation or surrounding inflammatory changes. Spleen: Normal in size without significant abnormality. Adrenals/Urinary Tract: Adrenal glands are unremarkable. Simple, benign left renal cortical cyst, for which no further follow-up or characterization is required. Kidneys are otherwise normal, without renal calculi, solid lesion, or hydronephrosis. Bladder is unremarkable. Stomach/Bowel: Stomach is within normal limits. Appendix appears normal. No evidence of bowel wall thickening, distention, or inflammatory changes. Descending colonic diverticula. Vascular/Lymphatic: Aortic atherosclerosis. No enlarged abdominal or pelvic lymph nodes. Surgical clips in the left groin (series 504, image 117). Reproductive: Prostatomegaly. Other: Fat containing umbilical hernia.  No ascites. Musculoskeletal: No acute osseous findings. IMPRESSION: 1. No noncontrast evidence of metastatic disease in the chest, abdomen, or pelvis. 2. Emphysema. 3. Prostatomegaly. 4. Coronary artery disease. Aortic Atherosclerosis (ICD10-I70.0) and Emphysema (ICD10-J43.9). Electronically Signed   By: Jearld Lesch M.D.   On: 10/22/2022 08:42   CT Abdomen Pelvis Wo Contrast  Result Date: 10/22/2022 CLINICAL DATA:  Bladder cancer restaging, penile cancer, assess treatment response * Tracking Code: BO * EXAM: CT CHEST, ABDOMEN AND PELVIS WITHOUT CONTRAST TECHNIQUE: Multidetector  CT imaging of the chest, abdomen and pelvis was performed following the standard protocol without IV contrast. RADIATION DOSE REDUCTION: This exam was performed according to the departmental dose-optimization program which includes automated exposure control, adjustment of the mA and/or kV according to patient size and/or use of iterative reconstruction technique. COMPARISON:  04/06/2022 FINDINGS: CT CHEST FINDINGS Cardiovascular: Left chest port catheter. Aortic atherosclerosis. Normal heart size. Three-vessel coronary artery calcifications. No pericardial effusion. Mediastinum/Nodes: No enlarged mediastinal, hilar, or axillary lymph nodes. Small hiatal hernia. Thyroid gland, trachea, and esophagus demonstrate no significant findings. Lungs/Pleura: Mild centrilobular and paraseptal emphysema. Unchanged bandlike scarring of the apices and right midlung (series 506, image 69). No pleural effusion or pneumothorax. Musculoskeletal: No chest wall abnormality. No acute osseous findings. CT ABDOMEN PELVIS FINDINGS Hepatobiliary: No solid liver abnormality is seen. No gallstones, gallbladder wall thickening, or biliary dilatation. Pancreas: Unremarkable. No pancreatic ductal dilatation or surrounding inflammatory changes. Spleen: Normal in size without significant abnormality. Adrenals/Urinary Tract: Adrenal glands are unremarkable. Simple, benign left renal cortical cyst, for which no further follow-up or characterization is required. Kidneys are otherwise normal, without renal calculi, solid lesion, or hydronephrosis. Bladder is unremarkable. Stomach/Bowel: Stomach is within normal limits. Appendix appears normal. No evidence of bowel wall thickening, distention, or  inflammatory changes. Descending colonic diverticula. Vascular/Lymphatic: Aortic atherosclerosis. No enlarged abdominal or pelvic lymph nodes. Surgical clips in the left groin (series 504, image 117). Reproductive: Prostatomegaly. Other: Fat containing  umbilical hernia.  No ascites. Musculoskeletal: No acute osseous findings. IMPRESSION: 1. No noncontrast evidence of metastatic disease in the chest, abdomen, or pelvis. 2. Emphysema. 3. Prostatomegaly. 4. Coronary artery disease. Aortic Atherosclerosis (ICD10-I70.0) and Emphysema (ICD10-J43.9). Electronically Signed   By: Jearld Lesch M.D.   On: 10/22/2022 08:42    Assessment & Plan:  Bradycardia -     EKG 12-Lead  Need for hepatitis C screening test -     Hepatitis C antibody; Future  Chronic hypokalemia -     Magnesium; Future -     Basic metabolic panel; Future -     Spironolactone; Take 1 tablet (25 mg total) by mouth daily.  Dispense: 90 tablet; Refill: 0  Primary hypertension -     Basic metabolic panel; Future -     Spironolactone; Take 1 tablet (25 mg total) by mouth daily.  Dispense: 90 tablet; Refill: 0  Stage 3a chronic kidney disease (HCC) -     Basic metabolic panel; Future  Need for prophylactic vaccination and inoculation against varicella -     Shingrix; Inject 0.5 mLs into the muscle once for 1 dose.  Dispense: 0.5 mL; Refill: 1  Abnormal electrocardiogram (ECG) (EKG) -     ECHOCARDIOGRAM COMPLETE; Future     Follow-up: Return in about 6 months (around 07/16/2023).  Sanda Linger, MD

## 2023-01-15 NOTE — Progress Notes (Signed)
Care Guide Note  01/15/2023 Name: Mario Hubbard MRN: [id number] DOB: 02/14/1951  Referred by: Etta Grandchild, MD Reason for referral : Care Coordination (Outreach to schedule with Pharm d )   Mario Hubbard is a 72 y.o. year old male who is a primary care patient of Etta Grandchild, MD. Mario Hubbard was referred to the pharmacist for assistance related to HTN.    Successful contact was made with the patient to discuss pharmacy services including being ready for the pharmacist to call at least 5 minutes before the scheduled appointment time, to have medication bottles and any blood sugar or blood pressure readings ready for review. The patient agreed to meet with the pharmacist via with the pharmacist via telephone visit on (date/time).  01/17/2023  Penne Lash, RMA Care Guide [facility]  [place], [place] [address] Direct Dial: [phone] [email or web address]

## 2023-01-16 LAB — HEPATITIS C ANTIBODY: Hepatitis C Ab: NONREACTIVE

## 2023-01-17 ENCOUNTER — Other Ambulatory Visit: Payer: Medicare Other | Admitting: Pharmacist

## 2023-01-17 ENCOUNTER — Encounter: Payer: Self-pay | Admitting: Internal Medicine

## 2023-01-17 DIAGNOSIS — E876 Hypokalemia: Secondary | ICD-10-CM

## 2023-01-17 NOTE — Progress Notes (Signed)
01/17/2023 Name: Mario Hubbard MRN: [id number] DOB: 1950-12-21  Chief Complaint  Patient presents with   Hypertension   Medication Management    Mario Hubbard is a 72 y.o. year old male who presented for a telephone visit.   They were referred to the pharmacist by their PCP for assistance in managing hypertension.    Subjective:  Care Team: Primary Care Provider: Etta Grandchild, MD ; Next Scheduled Visit: 07/16/2023   Medication Access/Adherence  Current Pharmacy:  [id number] Otto, [place] - [address] [facility] [facility] [address] [facility] [address] [place] [address] Phone: [phone] Fax: [phone]   Patient reports affordability concerns with their medications: No  Patient reports access/transportation concerns to their pharmacy: No  Patient reports adherence concerns with their medications:  No     Hypertension:  Current medications: amlodipine 10 mg daily, metoprolol succinate 50 mg daily, spironolactone 25 mg daily, klor con 20 mEQ TID, **indapamide 1.25 mg daily (was d/c by PCP but pt still taking) Medications previously tried: indapamide (d/c due to hypokalemia)  Patient has a validated, automated, upper arm home BP cuff Current blood pressure readings readings: 136/74 on 10/8  Patient denies hypotensive s/sx including dizziness, lightheadedness.    Objective:  Lab Results  Component Value Date   HGBA1C 5.6 09/13/2022    Lab Results  Component Value Date   CREATININE 1.10 01/15/2023   BUN 16 01/15/2023   NA 138 01/15/2023   K 3.1 (L) 01/15/2023   CL 102 01/15/2023   CO2 27 01/15/2023    Lab Results  Component Value Date   CHOL 147 09/13/2022   HDL 39.90 09/13/2022   LDLCALC 79 09/13/2022   TRIG 140.0 09/13/2022   CHOLHDL 4 09/13/2022    Medications Reviewed Today     Reviewed by Bonita Quin, RPH (Pharmacist) on 01/17/23 at 1355  Med List Status: <None>   Medication Order Taking? Sig Documenting Provider  Last Dose Status Informant  amLODipine (NORVASC) 10 MG tablet [id number] Yes TAKE 1 TABLET BY MOUTH EVERY DAY Etta Grandchild, MD Taking Active   atorvastatin (LIPITOR) 20 MG tablet [id number] Yes TAKE 1 TABLET BY MOUTH EVERY DAY Etta Grandchild, MD Taking Active   levocetirizine (XYZAL) 5 MG tablet [id number]  TAKE 1 TABLET BY MOUTH EVERY DAY IN THE Henrine Screws, MD  Active   metoprolol succinate (TOPROL-XL) 50 MG 24 hr tablet [id number] Yes TAKE 1 TABLET BY MOUTH EVERY DAY WITH OR IMMEDIATELY FOLLOWING A MEAL Etta Grandchild, MD Taking Active   potassium chloride SA (KLOR-CON M20) 20 MEQ tablet [id number] Yes TAKE 1 TABLET BY MOUTH THREE TIMES A DAY Etta Grandchild, MD Taking Active   sildenafil (VIAGRA) 50 MG tablet [id number]  Take 1 tablet (50 mg total) by mouth daily as needed for erectile dysfunction. Corwin Levins, MD  Active   spironolactone (ALDACTONE) 25 MG tablet [id number] Yes Take 1 tablet (25 mg total) by mouth daily. Etta Grandchild, MD Taking Active               Assessment/Plan:   Hypertension: - Currently uncontrolled, BP goal <130/80 - Patient will stop indapamide - Reviewed appropriate blood pressure monitoring technique and reviewed goal blood pressure. Recommended to check home blood pressure and heart rate daily for 2 weeks and keep a log to review at follow up appt - Recommend to check BMET in 2 weeks to check potassium     Follow  Up Plan: 10/23 walk-in lab, 10/24 telephone appt  Arbutus Leas, PharmD, BCPS [facility] [phone]

## 2023-01-19 ENCOUNTER — Other Ambulatory Visit: Payer: Self-pay | Admitting: Internal Medicine

## 2023-01-19 DIAGNOSIS — I10 Essential (primary) hypertension: Secondary | ICD-10-CM

## 2023-01-19 DIAGNOSIS — E876 Hypokalemia: Secondary | ICD-10-CM

## 2023-01-30 ENCOUNTER — Other Ambulatory Visit (INDEPENDENT_AMBULATORY_CARE_PROVIDER_SITE_OTHER): Payer: Medicare Other

## 2023-01-30 DIAGNOSIS — E876 Hypokalemia: Secondary | ICD-10-CM | POA: Diagnosis not present

## 2023-01-30 LAB — BASIC METABOLIC PANEL
BUN: 16 mg/dL (ref 6–23)
CO2: 27 meq/L (ref 19–32)
Calcium: 10.2 mg/dL (ref 8.4–10.5)
Chloride: 105 meq/L (ref 96–112)
Creatinine, Ser: 1.27 mg/dL (ref 0.40–1.50)
GFR: 56.01 mL/min — ABNORMAL LOW (ref >60.00)
Glucose, Bld: 139 mg/dL — ABNORMAL HIGH (ref 70–99)
Potassium: 4.5 meq/L (ref 3.5–5.1)
Sodium: 138 meq/L (ref 135–145)

## 2023-01-31 ENCOUNTER — Other Ambulatory Visit (INDEPENDENT_AMBULATORY_CARE_PROVIDER_SITE_OTHER): Payer: Medicare Other | Admitting: Pharmacist

## 2023-01-31 DIAGNOSIS — I10 Essential (primary) hypertension: Secondary | ICD-10-CM

## 2023-01-31 DIAGNOSIS — E876 Hypokalemia: Secondary | ICD-10-CM

## 2023-01-31 MED ORDER — METOPROLOL SUCCINATE ER 100 MG PO TB24
100.0000 mg | ORAL_TABLET | Freq: Every day | ORAL | 0 refills | Status: DC
Start: 2023-01-31 — End: 2023-05-02

## 2023-01-31 MED ORDER — POTASSIUM CHLORIDE CRYS ER 20 MEQ PO TBCR
20.0000 meq | EXTENDED_RELEASE_TABLET | Freq: Two times a day (BID) | ORAL | 0 refills | Status: DC
Start: 2023-01-31 — End: 2023-04-18

## 2023-01-31 NOTE — Patient Instructions (Signed)
It was a pleasure speaking with you today!  Increase metoprolol to 100 mg daily and reduce potassium to twice daily. Change taking your spironolactone to in the morning.  Check blood pressure and keep a log for review with me in 1.5 weeks.   Feel free to call with any questions or concerns!  Arbutus Leas, PharmD, BCPS [facility] Clinical Pharmacist [facility] [phone]

## 2023-01-31 NOTE — Progress Notes (Signed)
01/31/2023 Name: Mario Hubbard MRN: [id number] DOB: Jun 15, 1950  No chief complaint on file.   Mario Hubbard is a 72 y.o. year old male who presented for a telephone visit.   They were referred to the pharmacist by their PCP for assistance in managing hypertension.    Subjective:  Care Team: Primary Care Provider: Etta Grandchild, MD ; Next Scheduled Visit: 07/16/2023   Medication Access/Adherence  Current Pharmacy:  [id number] [place] [facility],  - [address] [facility] [address] AT [facility] [address] [place] [address] Phone: [phone] Fax: [phone]   Patient reports affordability concerns with their medications: No  Patient reports access/transportation concerns to their pharmacy: No  Patient reports adherence concerns with their medications:  No     Hypertension:  Current medications: amlodipine 10 mg daily, metoprolol succinate 50 mg daily, spironolactone 25 mg daily, klor con 20 mEQ TID *Pt is taking amlodipine, metoprolol, and spironolactone at night Medications previously tried: indapamide (d/c due to hypokalemia)  Patient has a validated, automated, upper arm home BP cuff Current blood pressure readings:  Pharmacy machine 10/13 3:30 PM 164/78 (79) 10/15 153/93 (88) 10/21 172/84 (84) 10/23 12:15 PM 165/84 (85)   Patient denies hypotensive s/sx including dizziness, lightheadedness.    Objective:  Lab Results  Component Value Date   HGBA1C 5.6 09/13/2022    Lab Results  Component Value Date   CREATININE 1.27 01/30/2023   BUN 16 01/30/2023   NA 138 01/30/2023   K 4.5 01/30/2023   CL 105 01/30/2023   CO2 27 01/30/2023    Lab Results  Component Value Date   CHOL 147 09/13/2022   HDL 39.90 09/13/2022   LDLCALC 79 09/13/2022   TRIG 140.0 09/13/2022   CHOLHDL 4 09/13/2022    Medications Reviewed Today   Medications were not reviewed in this encounter       Assessment/Plan:   Hypertension: - Currently  uncontrolled, BP goal <130/80 - Bps have increased significantly since d/c indapamide and started spironolactone. HR has also increased. - Reviewed appropriate blood pressure monitoring technique and reviewed goal blood pressure. Recommended to check home blood pressure and heart rate daily for 2 weeks and keep a log to review at follow up appt - Recommend increasing metoprolol succinate to 100 mg daily - Recommend reducing potassium to twice daily - Change spironolactone to taking the in the AM - Will call in 1.5 weeks to review BP readings   Follow Up Plan: 11/4 telephone f/u  Arbutus Leas, PharmD, BCPS [facility] [phone]

## 2023-02-06 ENCOUNTER — Ambulatory Visit (HOSPITAL_COMMUNITY): Payer: Medicare Other | Attending: Internal Medicine

## 2023-02-06 DIAGNOSIS — R9431 Abnormal electrocardiogram [ECG] [EKG]: Secondary | ICD-10-CM | POA: Insufficient documentation

## 2023-02-06 LAB — ECHOCARDIOGRAM COMPLETE
Area-P 1/2: 3.37 cm2
S' Lateral: 2.9 cm

## 2023-02-09 ENCOUNTER — Encounter: Payer: Self-pay | Admitting: Internal Medicine

## 2023-02-11 ENCOUNTER — Other Ambulatory Visit (INDEPENDENT_AMBULATORY_CARE_PROVIDER_SITE_OTHER): Payer: Medicare Other | Admitting: Pharmacist

## 2023-02-11 DIAGNOSIS — I10 Essential (primary) hypertension: Secondary | ICD-10-CM

## 2023-02-11 NOTE — Patient Instructions (Signed)
It was a pleasure speaking with you today!  Continue your current blood pressure medications and keep checking blood pressure at home.  I will check back in with you in 1 month. If you see blood pressure frequently of 150/90 or higher, call me before then.  Feel free to call with any questions or concerns!  Arbutus Leas, PharmD, BCPS [facility] Clinical Pharmacist [facility] [phone]

## 2023-02-11 NOTE — Progress Notes (Signed)
02/11/2023 Name: Mario Hubbard MRN: [id number] DOB: [birth year]  Chief Complaint  Patient presents with   Hypertension   Medication Management    Mario Hubbard is a 72 y.o. year old male who presented for a telephone visit.   They were referred to the pharmacist by their PCP for assistance in managing hypertension.    Subjective:  Care Team: Primary Care Provider: Etta Grandchild, MD ; Next Scheduled Visit: 07/16/2023  Medication Access/Adherence  Current Pharmacy:  [id number] Otto, [place] - [address] AT [facility] [address] [place] [address] Phone: [phone] Fax: [phone]   Patient reports affordability concerns with their medications: No  Patient reports access/transportation concerns to their pharmacy: No  Patient reports adherence concerns with their medications:  No     Hypertension:  Current medications: amlodipine 10 mg daily, metoprolol succinate 100 mg daily, spironolactone 25 mg daily, klor con 20 mEQ BID *Pt changed to taking all BP meds in the morning Medications previously tried: indapamide (d/c due to hypokalemia)  Patient has a validated, automated, upper arm home BP cuff Current blood pressure readings:  Pharmacy machine 10/13 3:30 PM 164/78 (79) 10/15 153/93 (88) 10/21 172/84 (84) 10/23 12:15 PM 165/84 (85)  10/28 141/78 (55)  Yesterday 144/84 (6 PM)  Patient denies hypotensive s/sx including dizziness, lightheadedness.  Patient denies hypertensive s/sx chest pain, headache  Limits sodium intake. Eats out and at home. Limits canned foods.  Walks 4-5 days per week  Objective:  Lab Results  Component Value Date   HGBA1C 5.6 09/13/2022    Lab Results  Component Value Date   CREATININE 1.27 01/30/2023   BUN 16 01/30/2023   NA 138 01/30/2023   K 4.5 01/30/2023   CL 105 01/30/2023   CO2 27 01/30/2023    Lab Results  Component Value Date   CHOL 147 09/13/2022   HDL 39.90 09/13/2022    LDLCALC 79 09/13/2022   TRIG 140.0 09/13/2022   CHOLHDL 4 09/13/2022    Medications Reviewed Today     Reviewed by Mario Hubbard, RPH (Pharmacist) on 02/11/23 at 1016  Med List Status: <None>   Medication Order Taking? Sig Documenting Provider Last Dose Status Informant  amLODipine (NORVASC) 10 MG tablet [id number] Yes TAKE 1 TABLET BY MOUTH EVERY DAY Mario Grandchild, MD Taking Active   atorvastatin (LIPITOR) 20 MG tablet [id number]  TAKE 1 TABLET BY MOUTH EVERY DAY Mario Grandchild, MD  Active   levocetirizine (XYZAL) 5 MG tablet [id number]  TAKE 1 TABLET BY MOUTH EVERY DAY IN THE Mario Screws, MD  Active   metoprolol succinate (TOPROL-XL) 100 MG 24 hr tablet [id number] Yes Take 1 tablet (100 mg total) by mouth daily. Take with or immediately following a meal. Mario Grandchild, MD Taking Active   potassium chloride SA (KLOR-CON M20) 20 MEQ tablet [id number] Yes Take 1 tablet (20 mEq total) by mouth 2 (two) times daily. Mario Grandchild, MD Taking Active   sildenafil (VIAGRA) 50 MG tablet [id number]  Take 1 tablet (50 mg total) by mouth daily as needed for erectile dysfunction. Mario Levins, MD  Active   spironolactone (ALDACTONE) 25 MG tablet [id number] Yes Take 1 tablet (25 mg total) by mouth daily. Mario Grandchild, MD Taking Active               Assessment/Plan:   Hypertension: - Currently uncontrolled, BP goal <130/80 - BPs have increased since d/c  indapamide and started spironolactone. - Reviewed appropriate blood pressure monitoring technique and reviewed goal blood pressure. Recommended to check home blood pressure and heart rate daily keep a log to review at follow up appt - Continue current regimen, Bps have improved from 150-170s systolic to low 140s - Will wait about 4 weeks to see how BP do over time.  - Pt to call before 4 weeks if seeing Bps go up to 150s+ again - Reviewed limiting sodium intake   Follow Up Plan: 12/4 telephone f/u  Mario Hubbard,  PharmD, BCPS [facility] [phone]

## 2023-02-28 ENCOUNTER — Other Ambulatory Visit: Payer: Self-pay | Admitting: Internal Medicine

## 2023-02-28 DIAGNOSIS — E785 Hyperlipidemia, unspecified: Secondary | ICD-10-CM

## 2023-02-28 DIAGNOSIS — I10 Essential (primary) hypertension: Secondary | ICD-10-CM

## 2023-03-11 ENCOUNTER — Other Ambulatory Visit: Payer: Self-pay | Admitting: Internal Medicine

## 2023-03-11 DIAGNOSIS — I10 Essential (primary) hypertension: Secondary | ICD-10-CM

## 2023-03-13 ENCOUNTER — Other Ambulatory Visit (INDEPENDENT_AMBULATORY_CARE_PROVIDER_SITE_OTHER): Payer: Medicare Other | Admitting: Pharmacist

## 2023-03-13 DIAGNOSIS — I10 Essential (primary) hypertension: Secondary | ICD-10-CM

## 2023-03-13 NOTE — Progress Notes (Unsigned)
03/14/2023 Name: Mario Hubbard MRN: [id number] DOB: 1950/10/06  Chief Complaint  Patient presents with   Medication Management   Hypertension    Mouhamed Mertens is a 72 y.o. year old male who presented for a telephone visit.   They were referred to the pharmacist by their PCP for assistance in managing hypertension.    Subjective:  Care Team: Primary Care Provider: Etta Grandchild, MD ; Next Scheduled Visit: 07/16/2023  Medication Access/Adherence  Current Pharmacy:  [id number], [place] - [address] [facility] [address] [facility] [address] [place] [address] Phone: [phone] Fax: [phone]   Patient reports affordability concerns with their medications: No  Patient reports access/transportation concerns to their pharmacy: No  Patient reports adherence concerns with their medications:  No     Hypertension:  Current medications: amlodipine 10 mg daily, metoprolol succinate 100 mg daily, spironolactone 25 mg daily, klor con 20 mEQ BID Medications previously tried: indapamide (d/c due to hypokalemia)  Patient has a validated, automated, upper arm home BP cuff Current blood pressure readings:  Pharmacy machine 11/11 127/73 11/18 147/87, HR 57 11/28 140/71 12/3 142/81 HR 50  *BP readings were previously 150-170s systolic  Patient denies hypotensive s/sx including dizziness, lightheadedness.  Patient denies hypertensive s/sx chest pain, headache  Limits sodium intake. Eats out and at home. Limits canned foods.  Walks 4-5 days per week  Objective:  Lab Results  Component Value Date   HGBA1C 5.6 09/13/2022    Lab Results  Component Value Date   CREATININE 1.27 01/30/2023   BUN 16 01/30/2023   NA 138 01/30/2023   K 4.5 01/30/2023   CL 105 01/30/2023   CO2 27 01/30/2023    Lab Results  Component Value Date   CHOL 147 09/13/2022   HDL 39.90 09/13/2022   LDLCALC 79 09/13/2022   TRIG 140.0 09/13/2022   CHOLHDL 4  09/13/2022    Medications Reviewed Today     Reviewed by Bonita Quin, RPH (Pharmacist) on 03/14/23 at 0907  Med List Status: <None>   Medication Order Taking? Sig Documenting Provider Last Dose Status Informant  amLODipine (NORVASC) 10 MG tablet [id number] Yes TAKE 1 TABLET BY MOUTH EVERY DAY Etta Grandchild, MD Taking Active   atorvastatin (LIPITOR) 20 MG tablet [id number] Yes TAKE 1 TABLET BY MOUTH EVERY DAY Etta Grandchild, MD Taking Active   levocetirizine (XYZAL) 5 MG tablet [id number]  TAKE 1 TABLET BY MOUTH EVERY DAY IN THE Henrine Screws, MD  Active   metoprolol succinate (TOPROL-XL) 100 MG 24 hr tablet [id number] Yes Take 1 tablet (100 mg total) by mouth daily. Take with or immediately following a meal. Etta Grandchild, MD Taking Active   potassium chloride SA (KLOR-CON M20) 20 MEQ tablet [id number] Yes Take 1 tablet (20 mEq total) by mouth 2 (two) times daily. Etta Grandchild, MD Taking Active   sildenafil (VIAGRA) 50 MG tablet [id number]  Take 1 tablet (50 mg total) by mouth daily as needed for erectile dysfunction. Corwin Levins, MD  Active   spironolactone (ALDACTONE) 25 MG tablet [id number] Yes Take 1 tablet (25 mg total) by mouth daily. Etta Grandchild, MD Taking Active               Assessment/Plan:   Hypertension: - Currently uncontrolled, BP goal <130/80 - BPs have increased since d/c indapamide and started spironolactone. - Reviewed appropriate blood pressure monitoring technique and reviewed goal blood pressure.  Recommended to check home blood pressure and heart rate daily keep a log to review at follow up appt - Continue current regimen, BPs have improved from 150-170s systolic to low 140s and one down to 120s/70s - Pt to call before 4 weeks if seeing Bps go up to 150s+ again   Follow Up Plan: 1/22 telephone f/u  Arbutus Leas, PharmD, BCPS [facility] [phone]

## 2023-03-16 IMAGING — CT CT ABD-PELV W/O CM
2 of 4 series · 13 of 36 positions shown, 16 images · non-contrast
Comparison: CT chest abdomen pelvis, 03/21/2020

CLINICAL DATA: Evaluate urethral cancer

EXAM:
CT CHEST, ABDOMEN AND PELVIS WITHOUT CONTRAST
TECHNIQUE: Multidetector CT imaging of the chest, abdomen and pelvis was
performed following the standard protocol without IV contrast. Oral
enteric contrast was administered.

[Series 2: cap w/o · axial · non-contrast · 0.77mm/px · z∈[-602,-62]mm · 10 of 132 slices shown, 13 images]
[im 12/132  mediastinal]
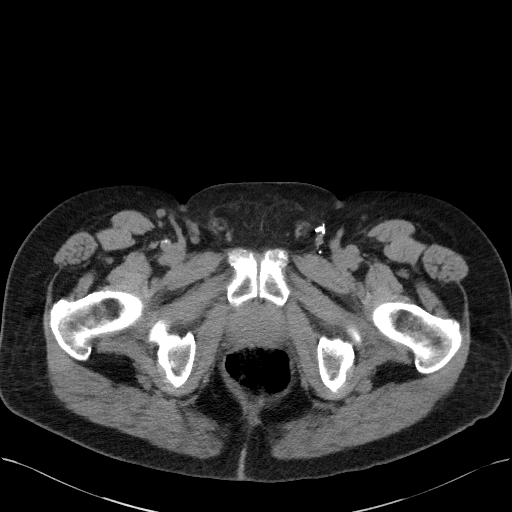
[im 12/132  lung]
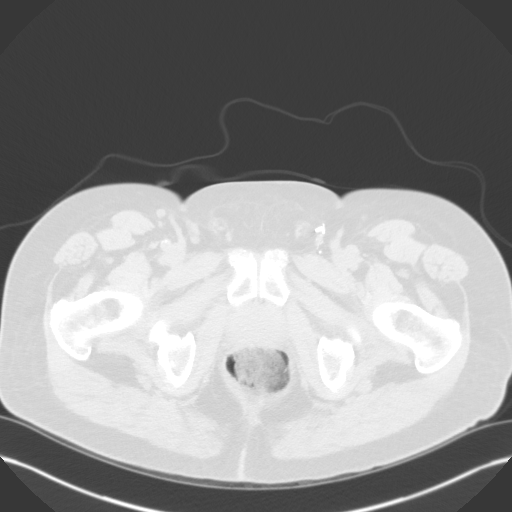
[im 24/132  lung]
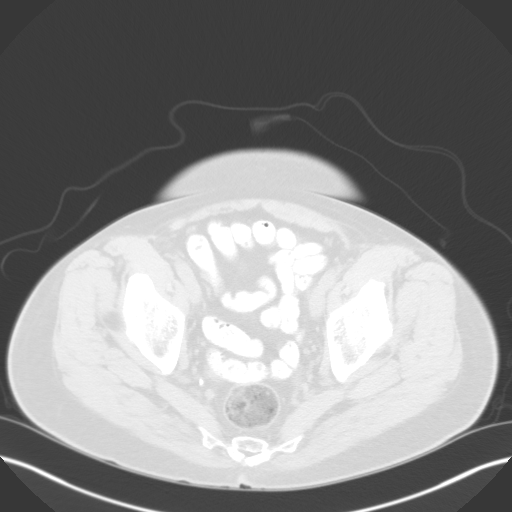
[im 36/132  lung]
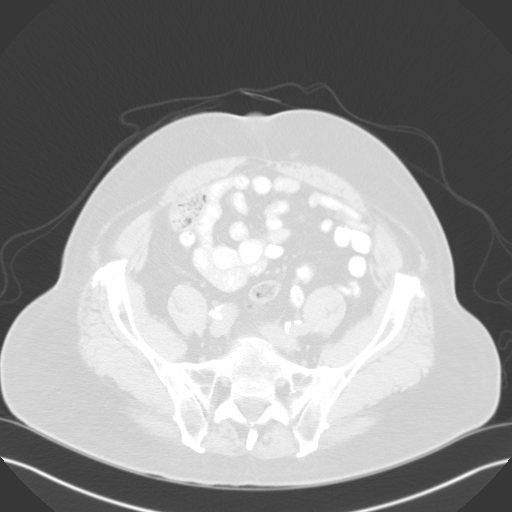
[im 48/132  lung]
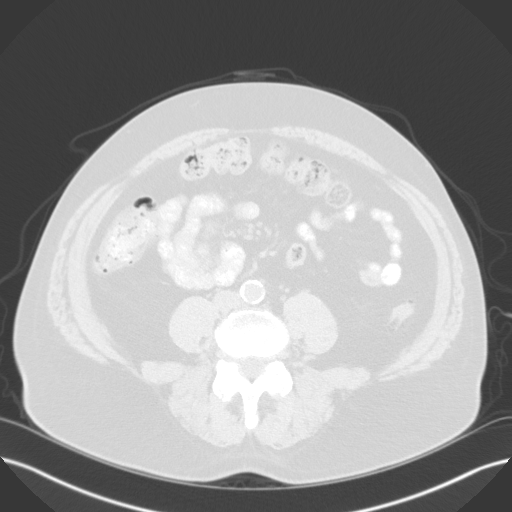
[im 60/132  mediastinal]
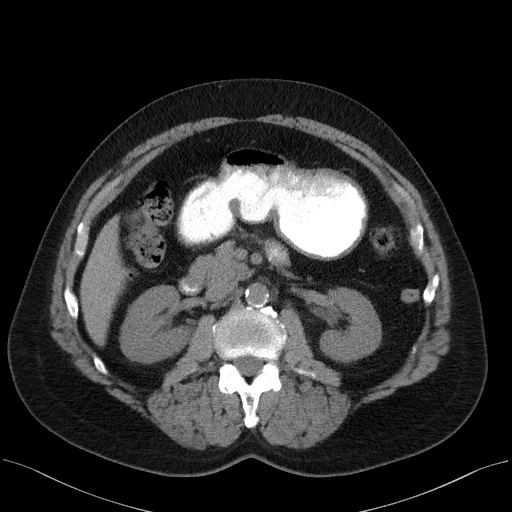
[im 60/132  lung]
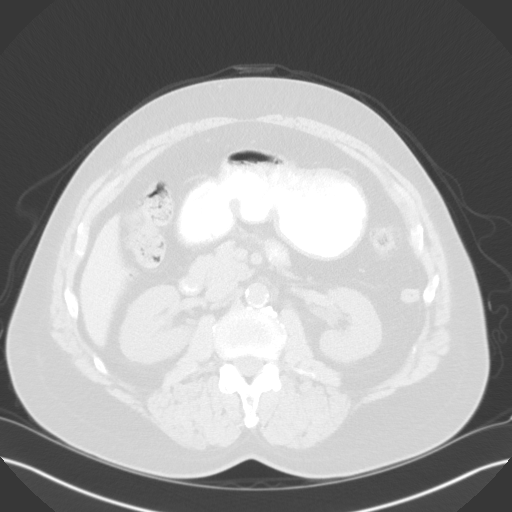
[im 72/132  lung]
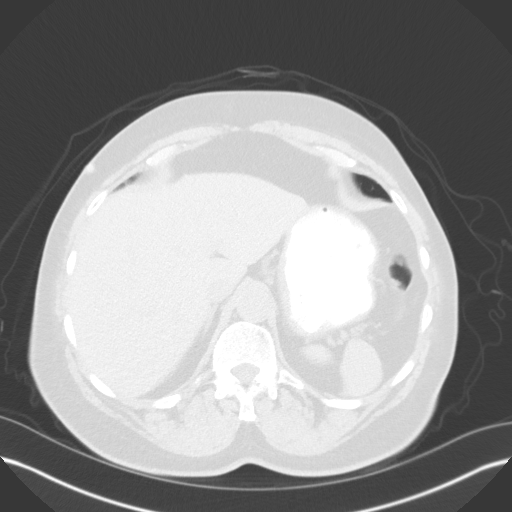
[im 84/132  lung]
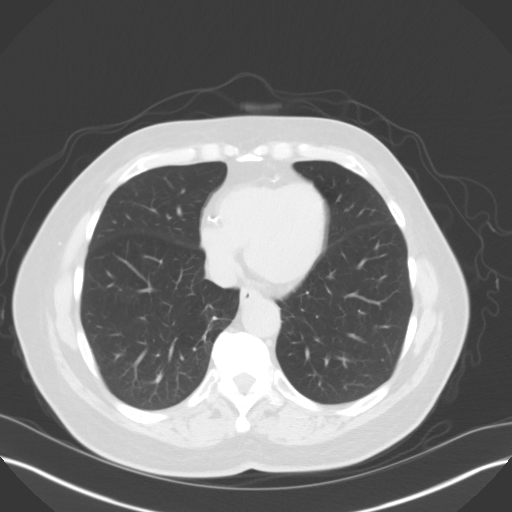
[im 96/132  lung]
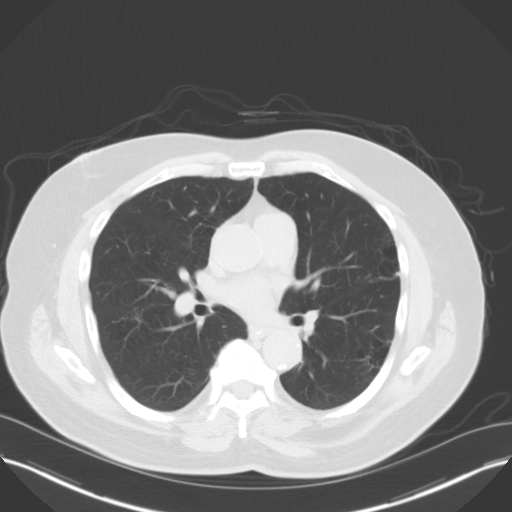
[im 108/132  mediastinal]
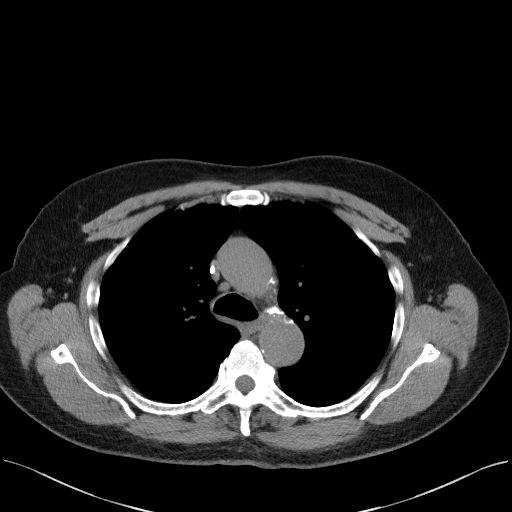
[im 108/132  lung]
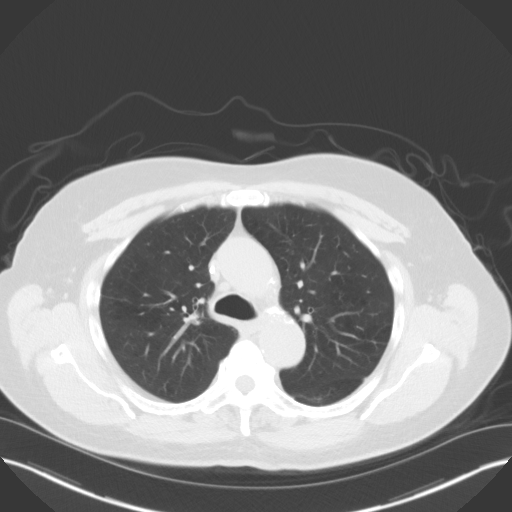
[im 120/132  lung]
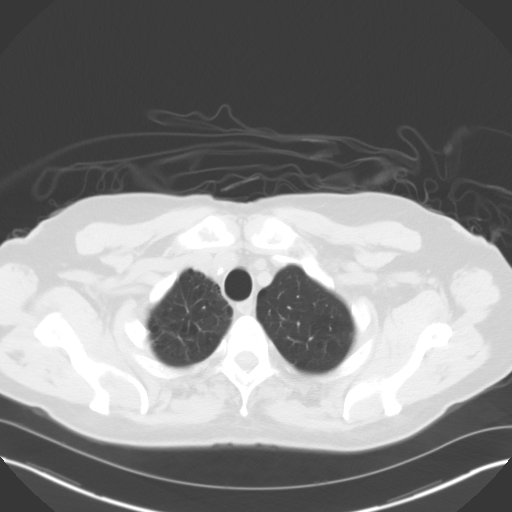

[Series 4: coronals · coronal · 0.80mm/px · 3 of 147 slices shown]
[im 30/147  lung]
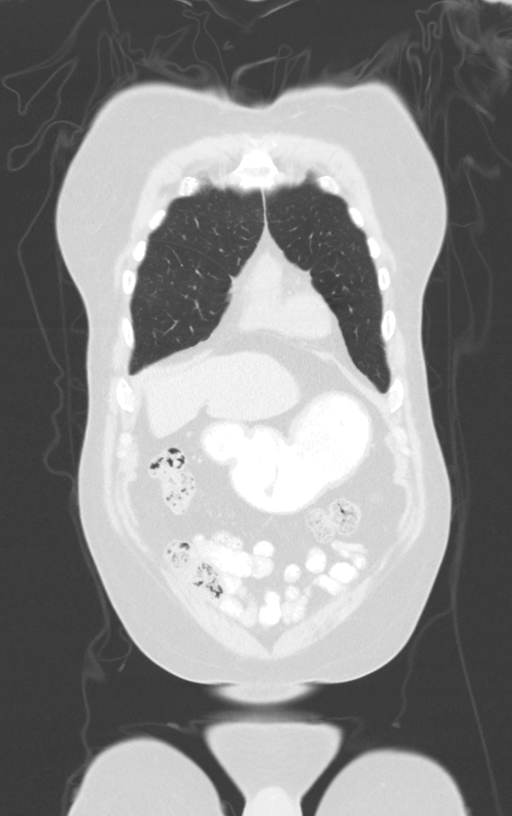
[im 59/147  lung]
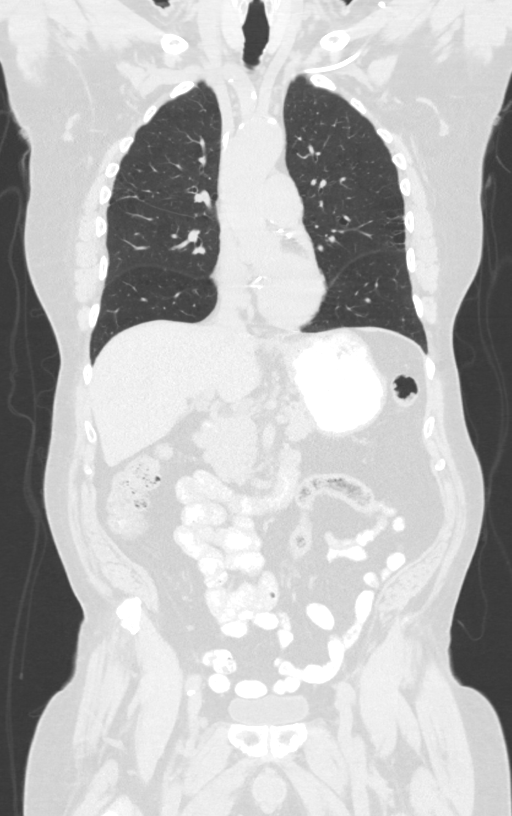
[im 88/147  lung]
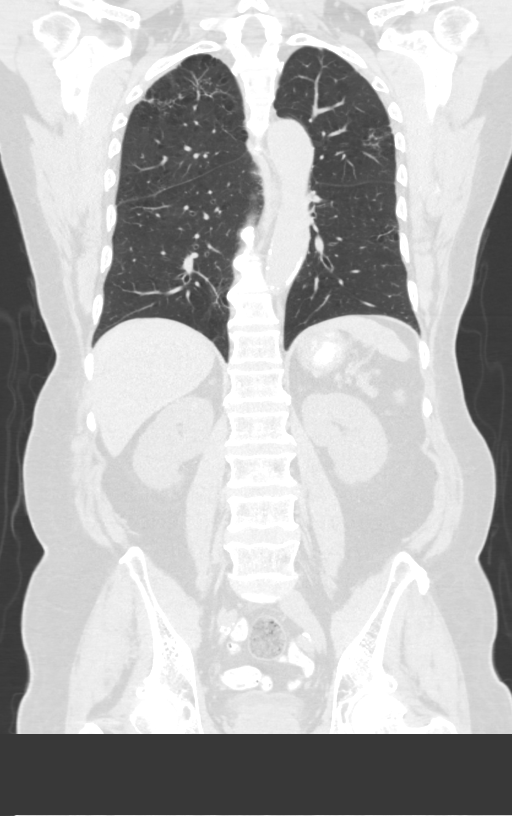

[13 of 36 positions shown; findings below may reference images not displayed]

FINDINGS: CT CHEST FINDINGS

Cardiovascular: Left chest port catheter. Aortic atherosclerosis.
Normal heart size. Three-vessel coronary artery calcifications. No
pericardial effusion.

Mediastinum/Nodes: No enlarged mediastinal, hilar, or axillary lymph
nodes. Thyroid gland, trachea, and esophagus demonstrate no
significant findings.

Lungs/Pleura: Mild centrilobular and paraseptal emphysema. Scattered
areas of bandlike scarring, for example in the right apex (series 6,
image 43). No pleural effusion or pneumothorax.

Musculoskeletal: No chest wall mass or suspicious bone lesions
identified.

CT ABDOMEN PELVIS FINDINGS

Hepatobiliary: No solid liver abnormality is seen. No gallstones,
gallbladder wall thickening, or biliary dilatation.

Pancreas: Unremarkable. No pancreatic ductal dilatation or
surrounding inflammatory changes.

Spleen: Normal in size without significant abnormality.

Adrenals/Urinary Tract: Adrenal glands are unremarkable. Kidneys are
normal, without renal calculi, solid lesion, or hydronephrosis.
Bladder is unremarkable.

Stomach/Bowel: Stomach is within normal limits. Appendix appears
normal. No evidence of bowel wall thickening, distention, or
inflammatory changes. Descending colonic diverticulosis.

Vascular/Lymphatic: Aortic atherosclerosis. No enlarged abdominal or
pelvic lymph nodes.

Reproductive: Mild prostatomegaly.

Other: Small, fat containing umbilical hernia. No abdominopelvic
ascites. Surgical clips in the left groin.

Musculoskeletal: No acute or significant osseous findings.
IMPRESSION: 1. No noncontrast evidence of mass, lymphadenopathy, or metastatic
disease in the chest, abdomen, or pelvis.
2. Prostatomegaly.
3. Diverticulosis without evidence of acute diverticulitis.
4. Emphysema.
5. Coronary artery disease.

Aortic Atherosclerosis (JVX2Y-D7I.I) and Emphysema (JVX2Y-LE7.Z).

## 2023-03-16 IMAGING — CT CT CHEST W/O CM
2 of 4 series · 13 of 36 positions shown, 16 images · non-contrast
Comparison: CT chest abdomen pelvis, 03/21/2020

CLINICAL DATA: Evaluate urethral cancer

EXAM:
CT CHEST, ABDOMEN AND PELVIS WITHOUT CONTRAST
TECHNIQUE: Multidetector CT imaging of the chest, abdomen and pelvis was
performed following the standard protocol without IV contrast. Oral
enteric contrast was administered.

[Series 2: cap w/o · axial · non-contrast · 0.77mm/px · z∈[-602,-62]mm · 10 of 132 slices shown, 13 images]
[im 12/132  mediastinal]
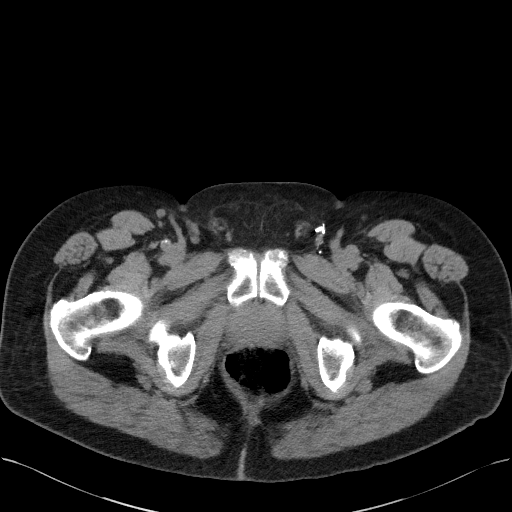
[im 12/132  lung]
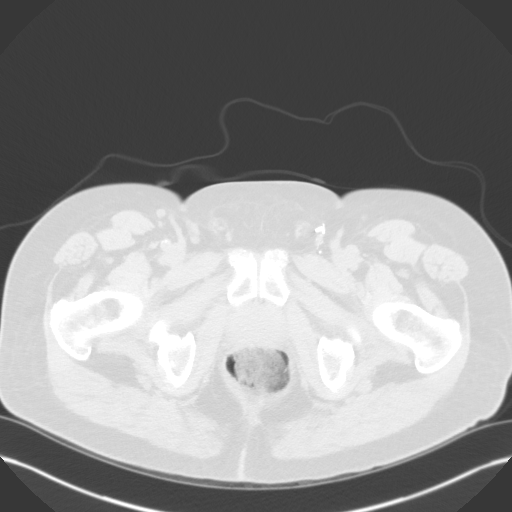
[im 24/132  lung]
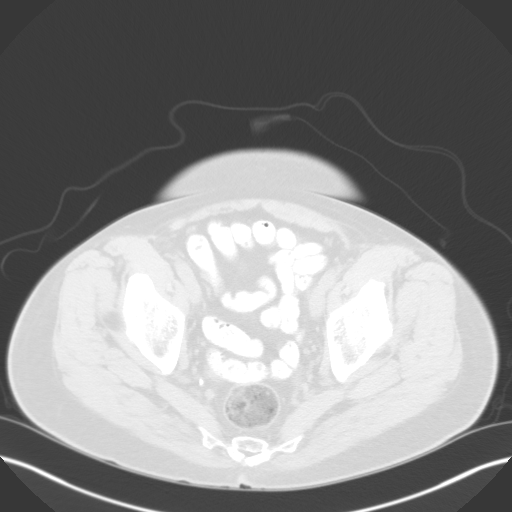
[im 36/132  lung]
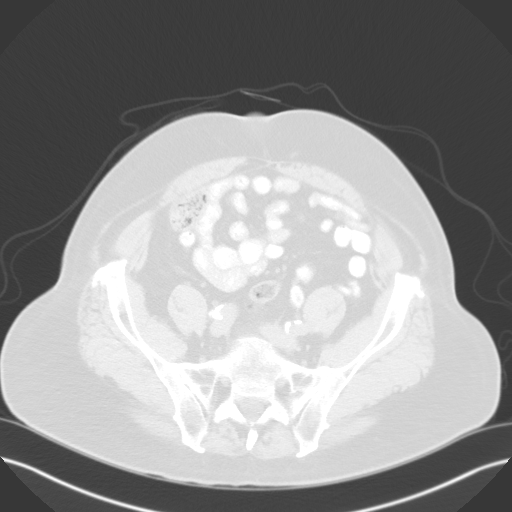
[im 48/132  lung]
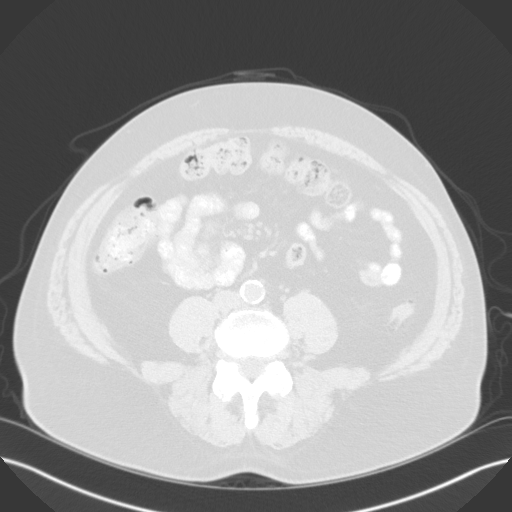
[im 60/132  mediastinal]
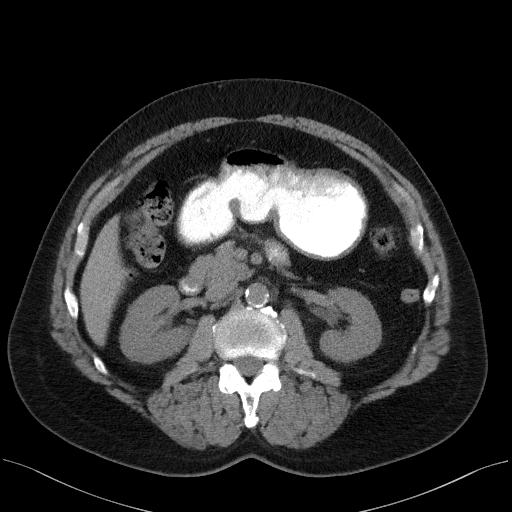
[im 60/132  lung]
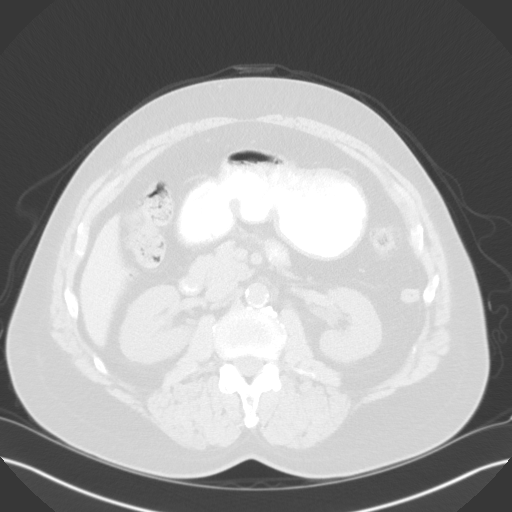
[im 72/132  lung]
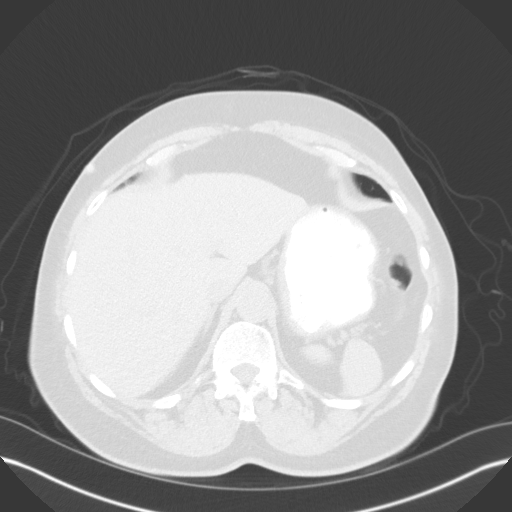
[im 84/132  lung]
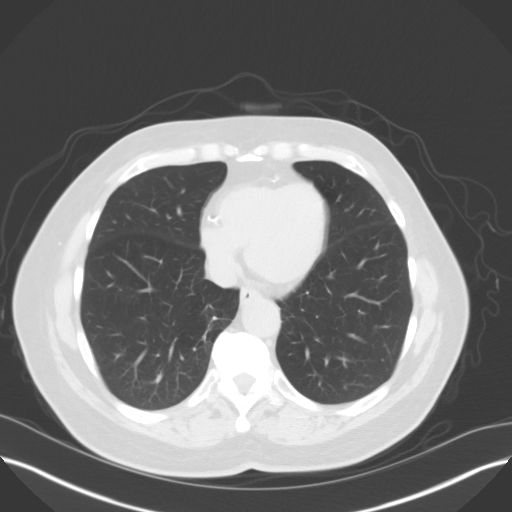
[im 96/132  lung]
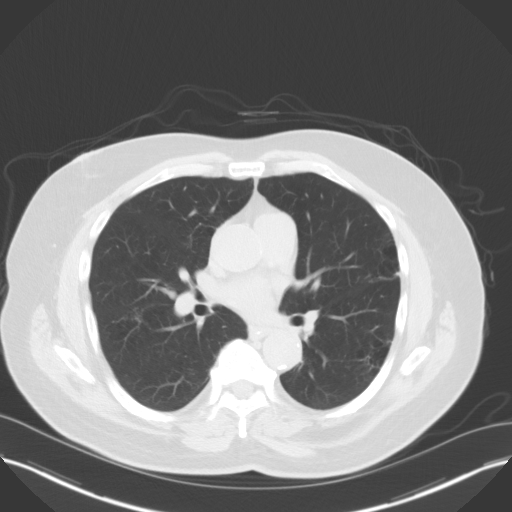
[im 108/132  mediastinal]
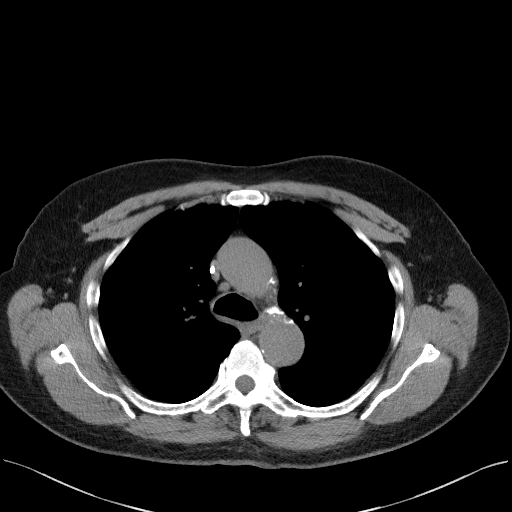
[im 108/132  lung]
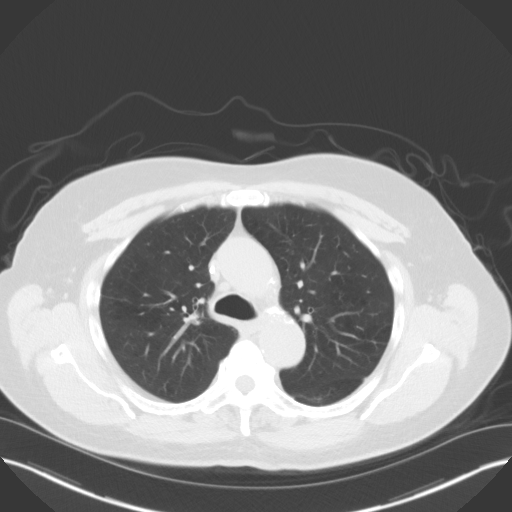
[im 120/132  lung]
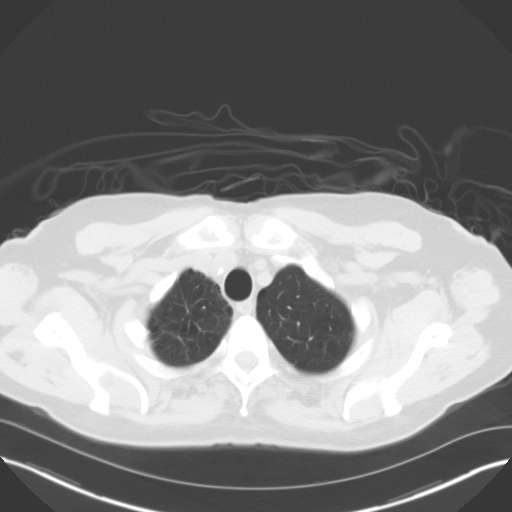

[Series 4: coronals · coronal · 0.80mm/px · 3 of 147 slices shown]
[im 30/147  lung]
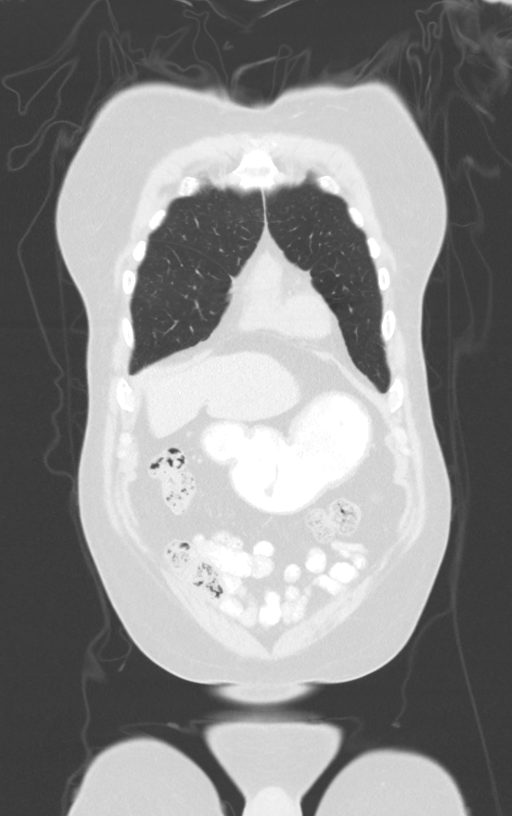
[im 59/147  lung]
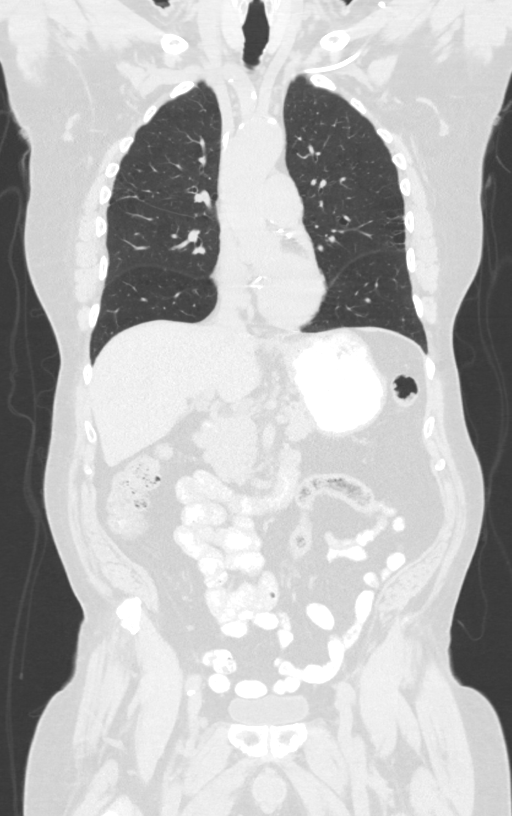
[im 88/147  lung]
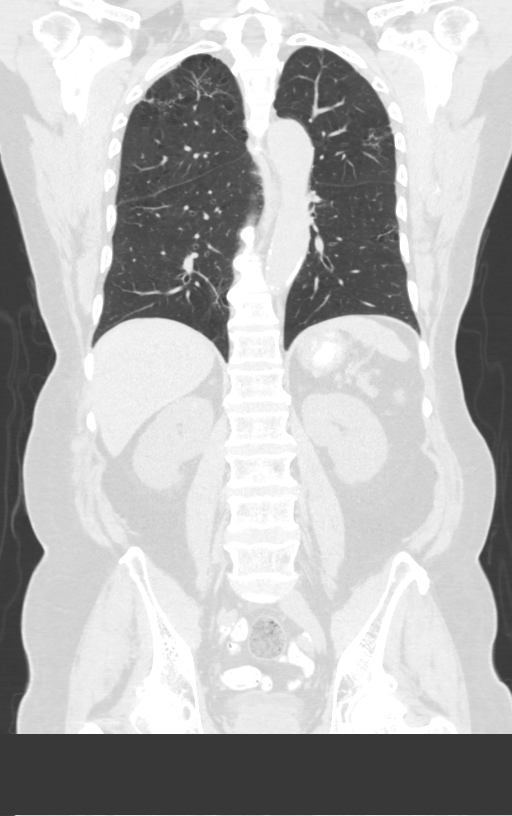

[13 of 36 positions shown; findings below may reference images not displayed]

FINDINGS: CT CHEST FINDINGS

Cardiovascular: Left chest port catheter. Aortic atherosclerosis.
Normal heart size. Three-vessel coronary artery calcifications. No
pericardial effusion.

Mediastinum/Nodes: No enlarged mediastinal, hilar, or axillary lymph
nodes. Thyroid gland, trachea, and esophagus demonstrate no
significant findings.

Lungs/Pleura: Mild centrilobular and paraseptal emphysema. Scattered
areas of bandlike scarring, for example in the right apex (series 6,
image 43). No pleural effusion or pneumothorax.

Musculoskeletal: No chest wall mass or suspicious bone lesions
identified.

CT ABDOMEN PELVIS FINDINGS

Hepatobiliary: No solid liver abnormality is seen. No gallstones,
gallbladder wall thickening, or biliary dilatation.

Pancreas: Unremarkable. No pancreatic ductal dilatation or
surrounding inflammatory changes.

Spleen: Normal in size without significant abnormality.

Adrenals/Urinary Tract: Adrenal glands are unremarkable. Kidneys are
normal, without renal calculi, solid lesion, or hydronephrosis.
Bladder is unremarkable.

Stomach/Bowel: Stomach is within normal limits. Appendix appears
normal. No evidence of bowel wall thickening, distention, or
inflammatory changes. Descending colonic diverticulosis.

Vascular/Lymphatic: Aortic atherosclerosis. No enlarged abdominal or
pelvic lymph nodes.

Reproductive: Mild prostatomegaly.

Other: Small, fat containing umbilical hernia. No abdominopelvic
ascites. Surgical clips in the left groin.

Musculoskeletal: No acute or significant osseous findings.
IMPRESSION: 1. No noncontrast evidence of mass, lymphadenopathy, or metastatic
disease in the chest, abdomen, or pelvis.
2. Prostatomegaly.
3. Diverticulosis without evidence of acute diverticulitis.
4. Emphysema.
5. Coronary artery disease.

Aortic Atherosclerosis (JVX2Y-D7I.I) and Emphysema (JVX2Y-LE7.Z).

## 2023-03-29 ENCOUNTER — Other Ambulatory Visit: Payer: Self-pay | Admitting: Internal Medicine

## 2023-03-29 DIAGNOSIS — I10 Essential (primary) hypertension: Secondary | ICD-10-CM

## 2023-03-29 DIAGNOSIS — E876 Hypokalemia: Secondary | ICD-10-CM

## 2023-04-18 ENCOUNTER — Other Ambulatory Visit: Payer: Self-pay | Admitting: Internal Medicine

## 2023-04-18 DIAGNOSIS — E876 Hypokalemia: Secondary | ICD-10-CM

## 2023-04-18 DIAGNOSIS — I10 Essential (primary) hypertension: Secondary | ICD-10-CM

## 2023-04-24 ENCOUNTER — Other Ambulatory Visit: Payer: Self-pay | Admitting: Nurse Practitioner

## 2023-04-24 DIAGNOSIS — C680 Malignant neoplasm of urethra: Secondary | ICD-10-CM

## 2023-04-25 ENCOUNTER — Telehealth: Payer: Self-pay

## 2023-04-25 ENCOUNTER — Inpatient Hospital Stay: Payer: Medicare Other | Attending: Nurse Practitioner

## 2023-04-25 ENCOUNTER — Encounter: Payer: Self-pay | Admitting: Nurse Practitioner

## 2023-04-25 ENCOUNTER — Inpatient Hospital Stay: Payer: Medicare Other | Admitting: Nurse Practitioner

## 2023-04-25 VITALS — BP 156/61 | HR 46 | Temp 97.6°F | Resp 18 | Ht 69.0 in | Wt 202.3 lb

## 2023-04-25 DIAGNOSIS — Z923 Personal history of irradiation: Secondary | ICD-10-CM | POA: Diagnosis not present

## 2023-04-25 DIAGNOSIS — Z8551 Personal history of malignant neoplasm of bladder: Secondary | ICD-10-CM | POA: Insufficient documentation

## 2023-04-25 DIAGNOSIS — C680 Malignant neoplasm of urethra: Secondary | ICD-10-CM

## 2023-04-25 LAB — CBC WITH DIFFERENTIAL (CANCER CENTER ONLY)
Abs Immature Granulocytes: 0.02 10*3/uL (ref 0.00–0.07)
Basophils Absolute: 0 10*3/uL (ref 0.0–0.1)
Basophils Relative: 1 %
Eosinophils Absolute: 0.3 10*3/uL (ref 0.0–0.5)
Eosinophils Relative: 5 %
HCT: 47.6 % (ref 39.0–52.0)
Hemoglobin: 15.5 g/dL (ref 13.0–17.0)
Immature Granulocytes: 0 %
Lymphocytes Relative: 31 %
Lymphs Abs: 1.8 10*3/uL (ref 0.7–4.0)
MCH: 28.3 pg (ref 26.0–34.0)
MCHC: 32.6 g/dL (ref 30.0–36.0)
MCV: 86.9 fL (ref 80.0–100.0)
Monocytes Absolute: 0.5 10*3/uL (ref 0.1–1.0)
Monocytes Relative: 9 %
Neutro Abs: 3.1 10*3/uL (ref 1.7–7.7)
Neutrophils Relative %: 54 %
Platelet Count: 155 10*3/uL (ref 150–400)
RBC: 5.48 MIL/uL (ref 4.22–5.81)
RDW: 13.8 % (ref 11.5–15.5)
WBC Count: 5.8 10*3/uL (ref 4.0–10.5)
nRBC: 0 % (ref 0.0–0.2)

## 2023-04-25 LAB — CMP (CANCER CENTER ONLY)
ALT: 16 U/L (ref 0–44)
AST: 18 U/L (ref 15–41)
Albumin: 4.3 g/dL (ref 3.5–5.0)
Alkaline Phosphatase: 102 U/L (ref 38–126)
Anion gap: 6 (ref 5–15)
BUN: 17 mg/dL (ref 8–23)
CO2: 24 mmol/L (ref 22–32)
Calcium: 10.2 mg/dL (ref 8.9–10.3)
Chloride: 107 mmol/L (ref 98–111)
Creatinine: 1.37 mg/dL — ABNORMAL HIGH (ref 0.61–1.24)
GFR, Estimated: 54 mL/min — ABNORMAL LOW (ref >60)
Glucose, Bld: 137 mg/dL — ABNORMAL HIGH (ref 70–99)
Potassium: 4.6 mmol/L (ref 3.5–5.1)
Sodium: 137 mmol/L (ref 135–145)
Total Bilirubin: 0.6 mg/dL (ref 0.0–1.2)
Total Protein: 7.3 g/dL (ref 6.5–8.1)

## 2023-04-25 NOTE — Progress Notes (Signed)
Patient Care Team: Etta Grandchild, MD as PCP - General (Internal Medicine) Bonita Quin, [facility] (Pharmacist)   CHIEF COMPLAINT: Follow up h/o urothelial cancer   CURRENT THERAPY: Surveillance   INTERVAL HISTORY Mr. Mulla returns for follow up as scheduled. Last seen by Dr. Leonides Schanz 10/22/22.  Doing very well overall with no significant changes in his health.  Denies pain, hematuria, unintentional weight loss, fatigue, or any other new or specific complaints.  Denies any residual side effects from chemo.  Sees urologist Dr. Burnett Sheng 2/18.  ROS  All other systems reviewed and negative  Past Medical History:  Diagnosis Date   Arthritis    Hypertension      No past surgical history on file.   Outpatient Encounter Medications as of 04/25/2023  Medication Sig   amLODipine (NORVASC) 10 MG tablet TAKE 1 TABLET BY MOUTH EVERY DAY   atorvastatin (LIPITOR) 20 MG tablet TAKE 1 TABLET BY MOUTH EVERY DAY   levocetirizine (XYZAL) 5 MG tablet TAKE 1 TABLET BY MOUTH EVERY DAY IN THE EVENING   metoprolol succinate (TOPROL-XL) 100 MG 24 hr tablet Take 1 tablet (100 mg total) by mouth daily. Take with or immediately following a meal.   potassium chloride SA (KLOR-CON M20) 20 MEQ tablet TAKE 1 TABLET BY MOUTH THREE TIMES A DAY   sildenafil (VIAGRA) 50 MG tablet Take 1 tablet (50 mg total) by mouth daily as needed for erectile dysfunction.   spironolactone (ALDACTONE) 25 MG tablet TAKE 1 TABLET (25 MG TOTAL) BY MOUTH DAILY.   No facility-administered encounter medications on file as of 04/25/2023.     Today's Vitals   04/25/23 0850 04/25/23 0900  BP:  (!) 156/61  Pulse:  (!) 46  Resp:  18  Temp:  97.6 F (36.4 C)  TempSrc:  Temporal  SpO2:  97%  Weight:  202 lb 4.8 oz (91.8 kg)  Height:  5\' 9"  (1.753 m)  PainSc: 0-No pain    Body mass index is 29.87 kg/m.   PHYSICAL EXAM GENERAL:alert, no distress and comfortable SKIN: no rash  EYES: sclera clear NECK: without mass LYMPH:  no  palpable cervical or supraclavicular lymphadenopathy  LUNGS: clear with normal breathing effort HEART: regular rate & rhythm, no lower extremity edema ABDOMEN: abdomen soft, non-tender and normal bowel sounds NEURO: alert & oriented x 3 with fluent speech, no focal motor/sensory deficits    CBC    Component Value Date/Time   WBC 5.8 04/25/2023 0815   WBC 8.3 07/02/2022 1125   RBC 5.48 04/25/2023 0815   HGB 15.5 04/25/2023 0815   HCT 47.6 04/25/2023 0815   PLT 155 04/25/2023 0815   MCV 86.9 04/25/2023 0815   MCH 28.3 04/25/2023 0815   MCHC 32.6 04/25/2023 0815   RDW 13.8 04/25/2023 0815   LYMPHSABS 1.8 04/25/2023 0815   MONOABS 0.5 04/25/2023 0815   EOSABS 0.3 04/25/2023 0815   BASOSABS 0.0 04/25/2023 0815     CMP     Component Value Date/Time   NA 137 04/25/2023 0815   K 4.6 04/25/2023 0815   CL 107 04/25/2023 0815   CO2 24 04/25/2023 0815   GLUCOSE 137 (H) 04/25/2023 0815   BUN 17 04/25/2023 0815   CREATININE 1.37 (H) 04/25/2023 0815   CALCIUM 10.2 04/25/2023 0815   PROT 7.3 04/25/2023 0815   ALBUMIN 4.3 04/25/2023 0815   AST 18 04/25/2023 0815   ALT 16 04/25/2023 0815   ALKPHOS 102 04/25/2023 0815   BILITOT  0.6 04/25/2023 0815   GFRNONAA 54 (L) 04/25/2023 0815     ASSESSMENT & PLAN: 73 year old male  Urothelial carcinoma, high-grade, stage IV with pelvic adenopathy -Initially diagnosed 2015 s/p surgical resection and adjuvant radiation -Metastatic recurrence in the right pelvic lymph node in 2017, s/p carbo/Taxol with excellent response, followed by Taxol then 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 Shrieves is clinically doing well.  Exam is benign, labs are unremarkable.  Overall no clinical concern for recurrence. -Proceed with surveillance imaging, I will call him with results. -Follow-up in  6 months, or sooner if needed   PLAN: -Labs reviewed -Continue surveillance -CT in 2 weeks, will call with results -Lab, f/up in 6 months, or sooner if needed -Continue healthy active lifestyle and age appropriate health maintenance   Orders Placed This Encounter  Procedures   CT CHEST ABDOMEN PELVIS WO CONTRAST    Standing Status:   Future    Expected Date:   05/09/2023    Expiration Date:   04/24/2024    Preferred imaging location?:   [facility]    If indicated for the ordered procedure, I authorize the administration of oral contrast media per Radiology protocol:   Yes    Does the patient have a contrast media/X-ray dye allergy?:   No      All questions were answered. The patient knows to call the clinic with any problems, questions or concerns. No barriers to learning were detected.   Santiago Glad, NP-C 04/25/2023

## 2023-04-25 NOTE — Telephone Encounter (Signed)
-----   Message from Pollyann Samples sent at 04/25/2023 12:35 PM EST ----- Mario Hubbard, Please let pt know K is normal but Scr has increased, please hydrate. He has PCP f/up next week.   Please help approve/schedule surveillance CT CAP in the next couple weeks. Orders entered.  Thanks [employer]

## 2023-04-25 NOTE — Telephone Encounter (Signed)
Pt advised of lab results and recommendations.  He will re address with PCP.    CT scan scheduled for 05/07/23 at [facility] Check in at 10:00

## 2023-05-01 ENCOUNTER — Other Ambulatory Visit (INDEPENDENT_AMBULATORY_CARE_PROVIDER_SITE_OTHER): Payer: Medicare Other | Admitting: Pharmacist

## 2023-05-01 DIAGNOSIS — E876 Hypokalemia: Secondary | ICD-10-CM

## 2023-05-01 DIAGNOSIS — I10 Essential (primary) hypertension: Secondary | ICD-10-CM

## 2023-05-01 MED ORDER — POTASSIUM CHLORIDE CRYS ER 20 MEQ PO TBCR: 20.0000 meq | EXTENDED_RELEASE_TABLET | Freq: Two times a day (BID) | ORAL | Status: DC

## 2023-05-01 NOTE — Progress Notes (Signed)
05/01/2023 Name: Mario Hubbard MRN: [id number] DOB: 1951-03-19  Chief Complaint  Patient presents with   Hypertension   Medication Management    Mario Hubbard is a 73 y.o. year old male who presented for a telephone visit.   They were referred to the pharmacist by their PCP for assistance in managing hypertension.    Subjective:  Care Team: Primary Care Provider: Etta Grandchild, MD ; Next Scheduled Visit: 07/16/2023  Medication Access/Adherence  Current Pharmacy:  [id number] [place], [place] - [address] AT [facility] [address] [place] [address] Phone: [phone] Fax: [phone]   Patient reports affordability concerns with their medications: No  Patient reports access/transportation concerns to their pharmacy: No  Patient reports adherence concerns with their medications:  No     Hypertension:  Current medications: amlodipine 10 mg daily, metoprolol succinate 100 mg daily, spironolactone 25 mg daily, klor con 20 mEQ BID Medications previously tried: indapamide (d/c due to hypokalemia)  Patient has a validated, automated, upper arm home BP cuff Current blood pressure readings:  Pharmacy machine - 03/28/23 153/81, 12/22 137/77, 12/26 145/71, 1/12 139/81, 1/19 134/75, 1/20 133/71 *Pt notes he increased water intake after 1/16 appt lab results and noticed his BP readings were lower  *BP readings were previously 150-170s systolic  Patient denies hypotensive s/sx including dizziness, lightheadedness.  Patient denies hypertensive s/sx chest pain, headache  Limits sodium intake. Eats out and at home. Limits canned foods.  Walks 4-5 days per week Drinking 2-3 16 oz water bottles per day  Objective:  Lab Results  Component Value Date   HGBA1C 5.6 09/13/2022    Lab Results  Component Value Date   CREATININE 1.37 (H) 04/25/2023   BUN 17 04/25/2023   NA 137 04/25/2023   K 4.6 04/25/2023   CL 107 04/25/2023   CO2 24  04/25/2023    Lab Results  Component Value Date   CHOL 147 09/13/2022   HDL 39.90 09/13/2022   LDLCALC 79 09/13/2022   TRIG 140.0 09/13/2022   CHOLHDL 4 09/13/2022    Medications Reviewed Today     Reviewed by Mario Hubbard, RPH (Pharmacist) on 05/01/23 at 1021  Med List Status: <None>   Medication Order Taking? Sig Documenting Provider Last Dose Status Informant  amLODipine (NORVASC) 10 MG tablet [id number] Yes TAKE 1 TABLET BY MOUTH EVERY DAY Mario Grandchild, MD Taking Active   atorvastatin (LIPITOR) 20 MG tablet [id number] Yes TAKE 1 TABLET BY MOUTH EVERY DAY Mario Grandchild, MD Taking Active   levocetirizine (XYZAL) 5 MG tablet [id number] Yes TAKE 1 TABLET BY MOUTH EVERY DAY IN THE Henrine Screws, MD Taking Active   metoprolol succinate (TOPROL-XL) 100 MG 24 hr tablet [id number] Yes Take 1 tablet (100 mg total) by mouth daily. Take with or immediately following a meal. Mario Grandchild, MD Taking Active   potassium chloride SA (KLOR-CON M20) 20 MEQ tablet [id number] Yes TAKE 1 TABLET BY MOUTH THREE TIMES A DAY  Patient taking differently: Take 20 mEq by mouth 2 (two) times daily.   Mario Grandchild, MD Taking Active   sildenafil (VIAGRA) 50 MG tablet [id number] Yes Take 1 tablet (50 mg total) by mouth daily as needed for erectile dysfunction. Corwin Levins, MD Taking Active   spironolactone (ALDACTONE) 25 MG tablet [id number] Yes TAKE 1 TABLET (25 MG TOTAL) BY MOUTH DAILY. Mario Grandchild, MD Taking Active  Assessment/Plan:   Hypertension: - Currently borderline controlled, BP goal <130/80 - BPs have decreased recently. Encouraged goal of 4 16 oz water bottles per day - Reviewed appropriate blood pressure monitoring technique and reviewed goal blood pressure. Recommended to check home blood pressure and heart rate frequently - Recent BMP shows slight increase in Scr and potassium WNL - Continue current regimen, recommend rechecking BMP next PCP f/u in  April   Follow Up Plan: f/u after PCP appt  Mario Hubbard, PharmD, BCPS, CPP Clinical Pharmacist Practitioner [facility] [phone]

## 2023-05-01 NOTE — Patient Instructions (Signed)
 It was a pleasure speaking with you today!    Feel free to call with any questions or concerns!  Arbutus Leas, PharmD, BCPS, CPP Clinical Pharmacist Practitioner [facility] [phone]

## 2023-05-02 ENCOUNTER — Other Ambulatory Visit: Payer: Self-pay | Admitting: Internal Medicine

## 2023-05-02 DIAGNOSIS — I10 Essential (primary) hypertension: Secondary | ICD-10-CM

## 2023-05-07 ENCOUNTER — Ambulatory Visit (HOSPITAL_COMMUNITY)
Admission: RE | Admit: 2023-05-07 | Discharge: 2023-05-07 | Disposition: A | Discharge status: Home / Self Care | Payer: Medicare Other | Source: Ambulatory Visit | Attending: Nurse Practitioner | Admitting: Nurse Practitioner

## 2023-05-07 ENCOUNTER — Telehealth: Payer: Self-pay | Admitting: *Deleted

## 2023-05-07 DIAGNOSIS — C680 Malignant neoplasm of urethra: Secondary | ICD-10-CM

## 2023-05-07 NOTE — Telephone Encounter (Signed)
-----   Message from Pollyann Samples sent at 05/07/2023  1:53 PM EST ----- Please let pt know CT shows a stable thyroid nodule and no evidence of cancer recurrence. If he agrees, please order thyroid US. I realize I did not schedule port flushes, he will need flush q8 weeks otherwise refer to IR to have it removed.   Thanks Lacie NP

## 2023-05-07 NOTE — Telephone Encounter (Signed)
Per Lillard Anes, NP, called pt with message below. Pt declined thyroid US for now and scheduling has been notified to schedule flush appts, q8weeks. Pt verbalized understanding

## 2023-05-26 ENCOUNTER — Other Ambulatory Visit: Payer: Self-pay | Admitting: Internal Medicine

## 2023-05-26 DIAGNOSIS — I10 Essential (primary) hypertension: Secondary | ICD-10-CM

## 2023-05-26 DIAGNOSIS — E785 Hyperlipidemia, unspecified: Secondary | ICD-10-CM

## 2023-06-15 ENCOUNTER — Other Ambulatory Visit: Payer: Self-pay | Admitting: Internal Medicine

## 2023-06-15 DIAGNOSIS — J301 Allergic rhinitis due to pollen: Secondary | ICD-10-CM

## 2023-07-02 ENCOUNTER — Other Ambulatory Visit: Payer: Self-pay | Admitting: Internal Medicine

## 2023-07-02 DIAGNOSIS — E876 Hypokalemia: Secondary | ICD-10-CM

## 2023-07-02 DIAGNOSIS — I10 Essential (primary) hypertension: Secondary | ICD-10-CM

## 2023-07-13 ENCOUNTER — Other Ambulatory Visit: Payer: Self-pay | Admitting: Internal Medicine

## 2023-07-13 DIAGNOSIS — I10 Essential (primary) hypertension: Secondary | ICD-10-CM

## 2023-07-13 DIAGNOSIS — E876 Hypokalemia: Secondary | ICD-10-CM

## 2023-07-16 ENCOUNTER — Ambulatory Visit (INDEPENDENT_AMBULATORY_CARE_PROVIDER_SITE_OTHER): Payer: Medicare Other | Admitting: Internal Medicine

## 2023-07-16 ENCOUNTER — Encounter: Payer: Self-pay | Admitting: Internal Medicine

## 2023-07-16 VITALS — BP 136/64 | HR 48 | Temp 98.0°F | Resp 16 | Ht 69.0 in | Wt 203.8 lb

## 2023-07-16 DIAGNOSIS — R739 Hyperglycemia, unspecified: Secondary | ICD-10-CM

## 2023-07-16 DIAGNOSIS — J301 Allergic rhinitis due to pollen: Secondary | ICD-10-CM | POA: Diagnosis not present

## 2023-07-16 DIAGNOSIS — R001 Bradycardia, unspecified: Secondary | ICD-10-CM | POA: Diagnosis not present

## 2023-07-16 DIAGNOSIS — I10 Essential (primary) hypertension: Secondary | ICD-10-CM | POA: Diagnosis not present

## 2023-07-16 LAB — BASIC METABOLIC PANEL WITH GFR
BUN: 17 mg/dL (ref 6–23)
CO2: 22 meq/L (ref 19–32)
Calcium: 10.2 mg/dL (ref 8.4–10.5)
Chloride: 108 meq/L (ref 96–112)
Creatinine, Ser: 1.29 mg/dL (ref 0.40–1.50)
GFR: 54.8 mL/min — ABNORMAL LOW (ref >60.00)
Glucose, Bld: 123 mg/dL — ABNORMAL HIGH (ref 70–99)
Potassium: 4.3 meq/L (ref 3.5–5.1)
Sodium: 140 meq/L (ref 135–145)

## 2023-07-16 LAB — HEMOGLOBIN A1C: Hgb A1c MFr Bld: 5.8 % (ref 4.6–6.5)

## 2023-07-16 LAB — TSH: TSH: 1.7 u[IU]/mL (ref 0.35–5.50)

## 2023-07-16 MED ORDER — METOPROLOL SUCCINATE ER 50 MG PO TB24: 50.0000 mg | ORAL_TABLET | Freq: Every day | ORAL | 1 refills | Status: DC

## 2023-07-16 MED ORDER — FLUTICASONE PROPIONATE 50 MCG/ACT NA SUSP: 2.0000 | Freq: Every day | NASAL | 1 refills | Status: DC

## 2023-07-16 NOTE — Progress Notes (Signed)
 Subjective:  Patient ID: Mario Hubbard, male    DOB: 1950-11-24  Age: 73 y.o. MRN: [id number]  CC: Hypertension   HPI Mario Hubbard presents for f/up ----  Discussed the use of AI scribe software for clinical note transcription with the patient, who gave verbal consent to proceed.  History of Present Illness   Mario Hubbard is a 73 year old male who presents with allergies and low heart rate.  He has allergies similar to last year when pollen levels increased, though the symptoms are less severe this time. He is currently taking Xyzal, which was prescribed during a prior visit. No epistaxis or phlegm production is noted.  He mentions a low heart rate but has no associated symptoms such as dizziness or lightheadedness. He feels well overall and is preparing to start his annual yard work for exercise.  No gastrointestinal issues such as nausea, vomiting, constipation, or diarrhea. No swelling in his legs or feet. He engages in regular physical activity through yard work and received a flu shot last year.       Outpatient Medications Prior to Visit  Medication Sig Dispense Refill   amLODipine (NORVASC) 10 MG tablet TAKE 1 TABLET BY MOUTH EVERY DAY 90 tablet 1   atorvastatin (LIPITOR) 20 MG tablet TAKE 1 TABLET BY MOUTH EVERY DAY 90 tablet 1   levocetirizine (XYZAL) 5 MG tablet TAKE 1 TABLET BY MOUTH EVERY DAY IN THE EVENING 90 tablet 0   potassium chloride SA (KLOR-CON M) 20 MEQ tablet TAKE 1 TABLET BY MOUTH TWICE A DAY 180 tablet 0   sildenafil (VIAGRA) 50 MG tablet Take 1 tablet (50 mg total) by mouth daily as needed for erectile dysfunction. 10 tablet 11   spironolactone (ALDACTONE) 25 MG tablet TAKE 1 TABLET (25 MG TOTAL) BY MOUTH DAILY. 90 tablet 0   metoprolol succinate (TOPROL-XL) 100 MG 24 hr tablet TAKE 1 TABLET BY MOUTH DAILY. TAKE WITH OR IMMEDIATELY FOLLOWING A MEAL. 90 tablet 0   No facility-administered medications prior to visit.    ROS Review of Systems  Constitutional:  Negative.  Negative for appetite change, chills, diaphoresis, fatigue and fever.  HENT:  Positive for congestion, postnasal drip and rhinorrhea. Negative for nosebleeds, sinus pressure, sneezing and sore throat.   Respiratory: Negative.  Negative for cough, chest tightness, shortness of breath and wheezing.   Cardiovascular:  Negative for chest pain, palpitations and leg swelling.  Gastrointestinal: Negative.  Negative for abdominal pain, constipation, diarrhea and nausea.  Genitourinary: Negative.  Negative for difficulty urinating.  Musculoskeletal: Negative.  Negative for arthralgias and myalgias.  Skin: Negative.   Neurological:  Negative for dizziness, syncope, weakness and light-headedness.  Hematological:  Negative for adenopathy. Does not bruise/bleed easily.  Psychiatric/Behavioral: Negative.      Objective:  BP 136/64 (BP Location: Left Arm, Patient Position: Sitting, Cuff Size: Normal)   Pulse (!) 48   Temp 98 F (36.7 C) (Oral)   Resp 16   Ht 5\' 9"  (1.753 m)   Wt 203 lb 12.8 oz (92.4 kg)   SpO2 96%   BMI 30.10 kg/m   BP Readings from Last 3 Encounters:  07/16/23 136/64  05/01/23 133/71  04/25/23 (!) 156/61    Wt Readings from Last 3 Encounters:  07/16/23 203 lb 12.8 oz (92.4 kg)  04/25/23 202 lb 4.8 oz (91.8 kg)  01/15/23 206 lb (93.4 kg)    Physical Exam Vitals reviewed.  Constitutional:      Appearance: Normal appearance.  HENT:  Nose: Mucosal edema, congestion and rhinorrhea present.     Right Turbinates: Pale.     Left Turbinates: Pale.     Right Sinus: No maxillary sinus tenderness or frontal sinus tenderness.     Left Sinus: No maxillary sinus tenderness or frontal sinus tenderness.  Eyes:     General: No scleral icterus.    Conjunctiva/sclera: Conjunctivae normal.  Cardiovascular:     Rate and Rhythm: Regular rhythm. Bradycardia present.     Pulses: Normal pulses.     Heart sounds: No murmur heard.    No friction rub. No gallop.      Comments: EKG- SB, 48 bpm NS ST/T wave changes No LVH or Q waves Unchanged  Pulmonary:     Effort: Pulmonary effort is normal.     Breath sounds: No stridor. No wheezing, rhonchi or rales.  Abdominal:     General: Abdomen is flat.     Palpations: There is no mass.     Tenderness: There is no abdominal tenderness. There is no guarding.     Hernia: No hernia is present.  Musculoskeletal:        General: Normal range of motion.     Right lower leg: No edema.     Left lower leg: No edema.  Skin:    General: Skin is warm and dry.  Neurological:     General: No focal deficit present.     Mental Status: He is alert.     Lab Results  Component Value Date   WBC 5.8 04/25/2023   HGB 15.5 04/25/2023   HCT 47.6 04/25/2023   PLT 155 04/25/2023   GLUCOSE 123 (H) 07/16/2023   CHOL 147 09/13/2022   TRIG 140.0 09/13/2022   HDL 39.90 09/13/2022   LDLCALC 79 09/13/2022   ALT 16 04/25/2023   AST 18 04/25/2023   NA 140 07/16/2023   K 4.3 07/16/2023   CL 108 07/16/2023   CREATININE 1.29 07/16/2023   BUN 17 07/16/2023   CO2 22 07/16/2023   TSH 1.70 07/16/2023   PSA 3.05 09/13/2022   HGBA1C 5.8 07/16/2023    CT CHEST ABDOMEN PELVIS WO CONTRAST Result Date: 05/07/2023 CLINICAL DATA:  History of high-grade urothelial cancer, surveillance. * Tracking Code: BO * EXAM: CT CHEST, ABDOMEN AND PELVIS WITHOUT CONTRAST TECHNIQUE: Multidetector CT imaging of the chest, abdomen and pelvis was performed following the standard protocol without IV contrast. RADIATION DOSE REDUCTION: This exam was performed according to the departmental dose-optimization program which includes automated exposure control, adjustment of the mA and/or kV according to patient size and/or use of iterative reconstruction technique. COMPARISON:  Multiple priors including CT October 17, 2022 FINDINGS: CT CHEST FINDINGS Cardiovascular: Left chest Port-A-Cath with tip in the SVC. Aortic atherosclerosis. Coronary artery calcifications.  Normal size heart. No significant pericardial effusion/thickening. Mediastinum/Nodes: Hypodense 2.3 cm nodule in the right lobe of the thyroid on image 2/2 similar dating back to March 21, 2020, consider further evaluation with thyroid ultrasound if not previously performed. No pathologically enlarged mediastinal, hilar or axillary lymph nodes. The esophagus is grossly unremarkable. Lungs/Pleura: No suspicious pulmonary nodules or masses. Scattered atelectasis/scarring. Mild emphysema. No pleural effusion. No pneumothorax. Musculoskeletal: No aggressive lytic or blastic lesion of bone. CT ABDOMEN PELVIS FINDINGS Hepatobiliary: No suspicious hepatic lesion on noncontrast enhanced examination. Gallbladder is unremarkable. No biliary ductal dilation. Pancreas: No pancreatic ductal dilation or evidence of acute inflammation. Spleen: No splenomegaly. Adrenals/Urinary Tract: Bilateral adrenal glands appear normal. No hydronephrosis. Left upper pole  renal cysts. Urinary bladder is unremarkable for degree of distension without focal wall thickening or suspicious intraluminal filling defect identified. Stomach/Bowel: Stomach is nondistended limiting evaluation. No pathologic dilation of small or large bowel. Colonic diverticulosis without findings of acute diverticulitis. No evidence of acute bowel inflammation. Vascular/Lymphatic: Aortic atherosclerosis. Smooth IVC contours. No pathologically enlarged abdominal or pelvic lymph nodes. Reproductive: Prostate is unremarkable. Other: Surgical clips in the left inguinal canal. Fat containing umbilical hernia. No significant abdominopelvic free fluid. Musculoskeletal: No aggressive lytic or blastic lesion of bone. Multilevel degenerative changes spine. Degenerative change of the bilateral hips. Degenerative change of the SI joints with bony ankylosis. IMPRESSION: 1. No noncontrast enhanced CT evidence of metastatic disease in the chest, abdomen or pelvis. 2. Hypodense 2.3 cm  nodule in the right lobe of the thyroid is similar dating back to March 21, 2020. Consider further evaluation with thyroid ultrasound if not previously performed. 3. Colonic diverticulosis without findings of acute diverticulitis. 4. Aortic Atherosclerosis (ICD10-I70.0) and Emphysema (ICD10-J43.9). Electronically Signed   By: Tama Fails M.D.   On: 05/07/2023 13:39    Assessment & Plan:   Bradycardia- HR is down to 48. Will lower the BB dose. He is asx. -     TSH; Future -     EKG 12-Lead -     AMB Referral [facility] Care Management  Primary hypertension- His BP is well controlled. -     Basic metabolic panel with GFR; Future -     Metoprolol Succinate ER; Take 1 tablet (50 mg total) by mouth daily. Take with or immediately following a meal.  Dispense: 90 tablet; Refill: 1 -     AMB Referral [facility] Care Management  Chronic hyperglycemia -     Hemoglobin A1c; Future -     Basic metabolic panel with GFR; Future  Seasonal allergic rhinitis due to pollen -     Fluticasone Propionate; Place 2 sprays into both nostrils daily.  Dispense: 48 mL; Refill: 1  Other orders -     EKG     Follow-up: Return in about 6 months (around 01/15/2024).  Sandra Crouch, MD

## 2023-07-16 NOTE — Patient Instructions (Signed)
 Bradycardia, Adult Bradycardia is a slower-than-normal heartbeat. A normal resting heart rate for an adult ranges from 60 to 100 beats per minute. With bradycardia, the resting heart rate is less than 60 beats per minute. Bradycardia can prevent enough oxygen from reaching certain areas of your body when you are active. It can be serious if it keeps enough oxygen from reaching your brain and other parts of your body. Bradycardia is not a problem for everyone. For some healthy adults, a slow resting heart rate is normal. What are the causes? This condition may be caused by: A problem with the heart, including: A problem with the heart's electrical system, such as a heart block. With a heart block, electrical signals between the chambers of the heart are partially or completely blocked, so they are not able to work as they should. A problem with the heart's natural pacemaker (sinus node). Heart disease. A heart attack. Heart damage. Lyme disease. A heart infection. A heart condition that is present at birth (congenital heart defect). Certain medicines that treat heart conditions. Certain conditions, such as hypothyroidism and obstructive sleep apnea. Problems with the balance of chemicals and other substances, like potassium, in the blood. Trauma. Radiation therapy. What increases the risk? You are more likely to develop this condition if you: Are age 74 or older. Have high blood pressure (hypertension), high cholesterol (hyperlipidemia), or diabetes. Drink heavily, use tobacco or nicotine products, or use drugs. What are the signs or symptoms? Symptoms of this condition include: Light-headedness. Feeling faint or fainting. Fatigue and weakness. Trouble with activity or exercise. Shortness of breath. Chest pain (angina). Drowsiness. Confusion. Dizziness. How is this diagnosed? This condition may be diagnosed based on: Your symptoms. Your medical history. A physical exam. During  the exam, your health care provider will listen to your heartbeat and check your pulse. To confirm the diagnosis, your health care provider may order tests, such as: Blood tests. An electrocardiogram (ECG). This test records the heart's electrical activity. The test can show how fast your heart is beating and whether the heartbeat is steady. A test in which you wear a portable device (event recorder or Holter monitor) to record your heart's electrical activity while you go about your day. An exercise test. How is this treated? Treatment for this condition depends on the cause of the condition and how severe your symptoms are. Treatment may involve: Treatment of the underlying condition. Changing your medicines or how much medicine you take. Having a small, battery-operated device called a pacemaker implanted under the skin. When bradycardia occurs, this device can be used to increase your heart rate and help your heart beat in a regular rhythm. Follow these instructions at home: Lifestyle Manage any health conditions that contribute to bradycardia as told by your health care provider. Follow a heart-healthy diet. A nutrition specialist (dietitian) can help educate you about healthy food options and changes. Follow an exercise program that is approved by your health care provider. Maintain a healthy weight. Try to reduce or manage your stress, such as with yoga or meditation. If you need help reducing stress, ask your health care provider. Do not use any products that contain nicotine or tobacco. These products include cigarettes, chewing tobacco, and vaping devices, such as e-cigarettes. If you need help quitting, ask your health care provider. Do not use illegal drugs. Alcohol use If you drink alcohol: Limit how much you have to: 0-1 drink a day for women who are not pregnant. 0-2 drinks a day  for men. Know how much alcohol is in a drink. In the [place] one drink equals one 12 oz bottle of  beer (355 mL), one 5 oz glass of wine (148 mL), or one 1 oz glass of hard liquor (44 mL). General instructions Take over-the-counter and prescription medicines only as told by your health care provider. Keep all follow-up visits. This is important. How is this prevented? In some cases, bradycardia may be prevented by: Treating underlying medical problems. Stopping behaviors or medicines that can trigger the condition. Contact a health care provider if: You feel light-headed or dizzy. You almost faint. You feel weak or are easily fatigued during physical activity. You experience confusion or have memory problems. Get help right away if: You faint. You have chest pains or an irregular heartbeat (palpitations). You have trouble breathing. These symptoms may represent a serious problem that is an emergency. Do not wait to see if the symptoms will go away. Get medical help right away. Call your local emergency services (911 in the [place] Do not drive yourself to the hospital. Summary Bradycardia is a slower-than-normal heartbeat. With bradycardia, the resting heart rate is less than 60 beats per minute. Treatment for this condition depends on the cause. Manage any health conditions that contribute to bradycardia as told by your health care provider. Do not use any products that contain nicotine or tobacco. These products include cigarettes, chewing tobacco, and vaping devices, such as e-cigarettes. Keep all follow-up visits. This is important. This information is not intended to replace advice given to you by your health care provider. Make sure you discuss any questions you have with your health care provider. Document Revised: 07/17/2020 Document Reviewed: 07/17/2020 Elsevier Patient Education  2024 [employer].

## 2023-07-18 ENCOUNTER — Telehealth: Payer: Self-pay | Admitting: *Deleted

## 2023-07-18 NOTE — Progress Notes (Signed)
 Care Guide Pharmacy Note  07/18/2023 Name: Mario Hubbard MRN: [id number] DOB: [birth year]  Referred By: Etta Grandchild, MD Reason for referral: Complex Care Management (Outreach to schedule referral with pharmacist )   Mario Hubbard is a 73 y.o. year old male who is a primary care patient of Etta Grandchild, MD.  Mario Hubbard was referred to the pharmacist for assistance related to: HTN  Successful contact was made with the patient to discuss pharmacy services including being ready for the pharmacist to call at least 5 minutes before the scheduled appointment time and to have medication bottles and any blood pressure readings ready for review. The patient agreed to meet with the pharmacist via telephone visit on 08/08/2023  Mario Hubbard, CMA [place]  [facility] Guide Direct Dial: [phone]  Fax: [phone] Website: [email or web address]

## 2023-07-19 ENCOUNTER — Encounter: Payer: Self-pay | Admitting: Internal Medicine

## 2023-07-28 ENCOUNTER — Other Ambulatory Visit: Payer: Self-pay | Admitting: Internal Medicine

## 2023-07-28 DIAGNOSIS — I10 Essential (primary) hypertension: Secondary | ICD-10-CM

## 2023-08-08 ENCOUNTER — Other Ambulatory Visit (INDEPENDENT_AMBULATORY_CARE_PROVIDER_SITE_OTHER): Admitting: Pharmacist

## 2023-08-08 DIAGNOSIS — I10 Essential (primary) hypertension: Secondary | ICD-10-CM

## 2023-08-08 NOTE — Progress Notes (Signed)
 08/09/2023 Name: Mario Hubbard MRN: [id number] DOB: 26-Apr-1950  Chief Complaint  Patient presents with   Hypertension   Medication Management    Jeannie Yoke is a 73 y.o. year old male who presented for a telephone visit.   They were referred to the pharmacist by their PCP for assistance in managing hypertension.   Subjective:  Care Team: Primary Care Provider: Arcadio Knuckles, MD ; Next Scheduled Visit: 07/16/2023  Medication Access/Adherence  Current Pharmacy:  [id number] [name], [place] - [address] [facility] [address] [place] [address] Phone: [phone] Fax: [phone]   Patient reports affordability concerns with their medications: No  Patient reports access/transportation concerns to their pharmacy: No  Patient reports adherence concerns with their medications:  No     Hypertension:  Current medications: amlodipine  10 mg daily, metoprolol  succinate 100 mg daily plus 50 mg daily, spironolactone  25 mg daily, klor con 20 mEQ BID Medications previously tried: indapamide  (d/c due to hypokalemia)  **PCP decrease metoprolol  to 50 mg daily at least visit due to low HR however, pt thought it was to be increased so he has been taking 150 mg daily of metoprolol   Patient has a validated, automated, upper arm home BP cuff Current blood pressure readings: 136/68 4/21 142 4/24 137/68 4/18 151/72 4/29 HR 90 4/24 HR 70 4/21 HR 62   Patient denies hypotensive s/sx including dizziness, lightheadedness.  Patient denies hypertensive s/sx chest pain, headache  Limits sodium intake. Eats out and at home. Limits canned foods.  Walks 4-5 days per week Drinking 2-3 16 oz water bottles per day  Objective:  Lab Results  Component Value Date   HGBA1C 5.8 07/16/2023    Lab Results  Component Value Date   CREATININE 1.29 07/16/2023   BUN 17 07/16/2023   NA 140 07/16/2023   K 4.3 07/16/2023   CL 108 07/16/2023   CO2 22  07/16/2023    Lab Results  Component Value Date   CHOL 147 09/13/2022   HDL 39.90 09/13/2022   LDLCALC 79 09/13/2022   TRIG 140.0 09/13/2022   CHOLHDL 4 09/13/2022    Medications Reviewed Today     Reviewed by Dion Frankel, RPH (Pharmacist) on 08/09/23 at 1703  Med List Status: <None>   Medication Order Taking? Sig Documenting Provider Last Dose Status Informant  amLODipine  (NORVASC ) 10 MG tablet [id number] Yes TAKE 1 TABLET BY MOUTH EVERY DAY Arcadio Knuckles, MD Taking Active   atorvastatin  (LIPITOR) 20 MG tablet [id number]  TAKE 1 TABLET BY MOUTH EVERY DAY Arcadio Knuckles, MD  Active   fluticasone  (FLONASE ) 50 MCG/ACT nasal spray [id number]  Place 2 sprays into both nostrils daily. Arcadio Knuckles, MD  Active   levocetirizine (XYZAL ) 5 MG tablet [id number]  TAKE 1 TABLET BY MOUTH EVERY DAY IN THE [name], MD  Active   metoprolol  succinate (TOPROL  XL) 50 MG 24 hr tablet [id number] Yes Take 1 tablet (50 mg total) by mouth daily. Take with or immediately following a meal. Arcadio Knuckles, MD Taking Active            Med Note Lolly Riser, Marigrace Mccole R   Fri Aug 09, 2023  5:03 PM) Taking WITH metoprolol  100 mg previously prescribed  potassium chloride  SA (KLOR-CON  M) 20 MEQ tablet [id number] Yes TAKE 1 TABLET BY MOUTH TWICE A DAY Arcadio Knuckles, MD Taking Active   sildenafil  (VIAGRA ) 50 MG tablet [id number]  Take 1 tablet (50 mg total) by mouth daily as needed for erectile dysfunction. Roslyn Coombe, MD  Active   spironolactone  (ALDACTONE ) 25 MG tablet [id number] Yes TAKE 1 TABLET (25 MG TOTAL) BY MOUTH DAILY. Arcadio Knuckles, MD Taking Active               Assessment/Plan:   Hypertension: - Currently borderline controlled, BP goal <130/80. Home HR readings are WNL, however office readings are low - Reviewed appropriate blood pressure monitoring technique and reviewed goal blood pressure. Recommended to check home blood pressure and heart rate frequently - Recent BMP  shows slight iSCr and K WNL - He will reduce metoprolol  to 50 mg. Will f/u in 2 weeks for BP readings/HR on metoprolol  50 mg   Follow Up Plan: 5/15  Rainelle Bur, PharmD, BCPS, CPP Clinical Pharmacist Practitioner [place] [facility] [phone]

## 2023-08-09 NOTE — Patient Instructions (Signed)
 It was a pleasure speaking with you today!  Reduce metoprolol  to 50 mg daily and continue monitoring blood pressure and heart rate.  Feel free to call with any questions or concerns!  Rainelle Bur, PharmD, BCPS, CPP Clinical Pharmacist Practitioner [place] [facility] [phone]

## 2023-08-22 ENCOUNTER — Other Ambulatory Visit (INDEPENDENT_AMBULATORY_CARE_PROVIDER_SITE_OTHER): Admitting: Pharmacist

## 2023-08-22 DIAGNOSIS — I10 Essential (primary) hypertension: Secondary | ICD-10-CM

## 2023-08-22 MED ORDER — LOSARTAN POTASSIUM 25 MG PO TABS: 25.0000 mg | ORAL_TABLET | Freq: Every day | ORAL | 0 refills | Status: DC

## 2023-08-22 NOTE — Progress Notes (Signed)
   08/22/2023 Name: Mario Hubbard MRN: [id number] DOB: 02/08/1951  Chief Complaint  Patient presents with   Hypertension   Medication Management    Mario Hubbard is a 73 y.o. year old male who presented for a telephone visit.   They were referred to the pharmacist by their PCP for assistance in managing hypertension.   Subjective:  Care Team: Primary Care Provider: Arcadio Knuckles, MD ; Next Scheduled Visit: 01/15/2024  Medication Access/Adherence  Current Pharmacy:  [name], [place] - [address] [facility] [address] [place] [address] Phone: [phone] Fax: [phone]   Patient reports affordability concerns with their medications: No  Patient reports access/transportation concerns to their pharmacy: No  Patient reports adherence concerns with their medications:  No     Hypertension:  Current medications: amlodipine  10 mg daily, metoprolol  succinate 50 mg daily, spironolactone  25 mg daily, klor con 20 mEQ BID Medications previously tried: indapamide  (d/c due to hypokalemia) Metoprolol  was decreased from 100 mg due to bradycardia   Patient has a validated, automated, upper arm home BP cuff Current blood pressure readings:  Since decreasing metoprolol : 5/5 151/91 (84) 5/11 145/95 (76) 5/12 155/89 (84) 5/13 146/76  5/14 137/83 (83)  Patient denies hypotensive s/sx including dizziness, lightheadedness.  Patient denies hypertensive s/sx chest pain, headache  Limits sodium intake. Eats out and at home. Limits canned foods.  Walks 4-5 days per week Drinking 2-3 16 oz water bottles per day  Objective:  Lab Results  Component Value Date   HGBA1C 5.8 07/16/2023    Lab Results  Component Value Date   CREATININE 1.29 07/16/2023   BUN 17 07/16/2023   NA 140 07/16/2023   K 4.3 07/16/2023   CL 108 07/16/2023   CO2 22 07/16/2023    Lab Results  Component Value Date   CHOL 147 09/13/2022   HDL 39.90  09/13/2022   LDLCALC 79 09/13/2022   TRIG 140.0 09/13/2022   CHOLHDL 4 09/13/2022    Medications Reviewed Today   Medications were not reviewed in this encounter       Assessment/Plan:   Hypertension: - Currently uncontrolled, BP goal <130/80.  - Reviewed appropriate blood pressure monitoring technique and reviewed goal blood pressure. Recommended to check home blood pressure and heart rate frequently - Recommend to start losartan  25 mg daily - F/u in 2 weeks for home BP readings and to set up walk in lab for BMP   Follow Up Plan: 5/30  Mario Hubbard, PharmD, BCPS, CPP Clinical Pharmacist Practitioner [facility] [phone]

## 2023-08-28 NOTE — Patient Instructions (Signed)
 It was a pleasure speaking with you today!  Continue metoprolol  at 50 mg daily. Start losartan  25 mg daily. Continue monitoring blood pressure at home.  Feel free to call with any questions or concerns!  Rainelle Bur, PharmD, BCPS, CPP Clinical Pharmacist Practitioner [place] [facility] at [facility] [phone]

## 2023-09-06 ENCOUNTER — Other Ambulatory Visit (INDEPENDENT_AMBULATORY_CARE_PROVIDER_SITE_OTHER): Admitting: Pharmacist

## 2023-09-06 DIAGNOSIS — I10 Essential (primary) hypertension: Secondary | ICD-10-CM

## 2023-09-06 NOTE — Patient Instructions (Signed)
 It was a pleasure speaking with you today!  Continue your current regimen. Come 6/3 for walk-in labs. I will call with the results.  Feel free to call with any questions or concerns!  Rainelle Bur, PharmD, BCPS, CPP Clinical Pharmacist Practitioner [place] [facility] at [facility] [phone]

## 2023-09-06 NOTE — Progress Notes (Signed)
 08/22/2023 Name: Mario Hubbard MRN: [id number] DOB: Jun 12, 1950  Chief Complaint  Patient presents with   Hypertension   Medication Management    Mario Hubbard is a 73 y.o. year old male who presented for a telephone visit.   They were referred to the pharmacist by their PCP for assistance in managing hypertension.   Subjective:  Care Team: Primary Care Provider: Arcadio Knuckles, MD ; Next Scheduled Visit: 01/15/2024  Medication Access/Adherence  Current Pharmacy:  [id number] Mario Hubbard, [place] - [address] [facility] [address] [place] [address] Phone: [phone] Fax: [phone]   Patient reports affordability concerns with their medications: No  Patient reports access/transportation concerns to their pharmacy: No  Patient reports adherence concerns with their medications:  No     Hypertension:  Current medications: amlodipine  10 mg daily, metoprolol  succinate 50 mg daily, spironolactone  25 mg daily, klor con 20 mEQ BID, losartan  25 mg daily (started 08/23/23) Medications previously tried: indapamide  (d/c due to hypokalemia) Metoprolol  was decreased from 100 mg due to bradycardia   Patient has a validated, automated, upper arm home BP cuff Current blood pressure readings:  Since starting losartan : 5/17  131/76 (77) 5/18 1 PM 134/76 (71) 5/21 141/87 (76) 5/22 9 AM 139/77 (72) 5/25 141/77 (60) 5/28 130/90 (98) 5/29 133/77 (95)  Previous readings: 5/5 151/91 (84) 5/11 145/95 (76) 5/12 155/89 (84) 5/13 146/76  5/14 137/83 (83)  Patient denies hypotensive s/sx including dizziness, lightheadedness.  Patient denies hypertensive s/sx chest pain, headache  Limits sodium intake. Eats out and at home. Limits canned foods.  Walks 4-5 days per week Drinking 2-3 16 oz water bottles per day  Objective:  Lab Results  Component Value Date   HGBA1C 5.8 07/16/2023    Lab Results  Component Value Date   CREATININE 1.29  07/16/2023   BUN 17 07/16/2023   NA 140 07/16/2023   K 4.3 07/16/2023   CL 108 07/16/2023   CO2 22 07/16/2023    Lab Results  Component Value Date   CHOL 147 09/13/2022   HDL 39.90 09/13/2022   LDLCALC 79 09/13/2022   TRIG 140.0 09/13/2022   CHOLHDL 4 09/13/2022    Medications Reviewed Today     Reviewed by Mario Hubbard (Pharmacist) on 09/06/23 at 1039  Med List Status: <None>   Medication Order Taking? Sig Documenting Provider Last Dose Status Informant  amLODipine  (NORVASC ) 10 MG tablet [id number] Yes TAKE 1 TABLET BY MOUTH EVERY DAY Mario Knuckles, MD Taking Active   atorvastatin  (LIPITOR) 20 MG tablet [id number]  TAKE 1 TABLET BY MOUTH EVERY DAY Mario Knuckles, MD  Active   fluticasone  (FLONASE ) 50 MCG/ACT nasal spray [id number]  Place 2 sprays into both nostrils daily. Mario Knuckles, MD  Active   levocetirizine (XYZAL ) 5 MG tablet [id number]  TAKE 1 TABLET BY MOUTH EVERY DAY IN THE Mario Kief, MD  Active   losartan  (COZAAR ) 25 MG tablet [id number] Yes Take 1 tablet (25 mg total) by mouth daily. Mario Knuckles, MD Taking Active   metoprolol  succinate (TOPROL  XL) 50 MG 24 hr tablet [id number] Yes Take 1 tablet (50 mg total) by mouth daily. Take with or immediately following a meal. Mario Knuckles, MD Taking Active            Med Note Mario Hubbard, Mario Hubbard   Fri Sep 06, 2023 10:39 AM)    potassium chloride  SA (KLOR-CON  M)  20 MEQ tablet [id number]  TAKE 1 TABLET BY MOUTH TWICE A DAY Mario Knuckles, MD  Active   sildenafil  (VIAGRA ) 50 MG tablet [id number]  Take 1 tablet (50 mg total) by mouth daily as needed for erectile dysfunction. Mario Coombe, MD  Active   spironolactone  (ALDACTONE ) 25 MG tablet [id number] Yes TAKE 1 TABLET (25 MG TOTAL) BY MOUTH DAILY. Mario Knuckles, MD Taking Active               Assessment/Plan:   Hypertension: - Currently borderline controlled, BP goal <130/80. BP are looking much better after adding losartan . - Reviewed  appropriate blood pressure monitoring technique and reviewed goal blood pressure. Recommended to check home blood pressure and heart rate frequently - Recommend to continue current regimen. Pt to come next week for walk-in BMP to check Scr and K since starting ARB   Follow Up Plan: 6/3 lab  Mario Hubbard, PharmD, BCPS, CPP Clinical Pharmacist Practitioner [facility] [phone]

## 2023-09-11 ENCOUNTER — Other Ambulatory Visit (INDEPENDENT_AMBULATORY_CARE_PROVIDER_SITE_OTHER)

## 2023-09-11 ENCOUNTER — Ambulatory Visit: Payer: Self-pay | Admitting: Pharmacist

## 2023-09-11 DIAGNOSIS — I10 Essential (primary) hypertension: Secondary | ICD-10-CM

## 2023-09-11 LAB — BASIC METABOLIC PANEL WITH GFR
BUN: 17 mg/dL (ref 6–23)
CO2: 22 meq/L (ref 19–32)
Calcium: 9.6 mg/dL (ref 8.4–10.5)
Chloride: 106 meq/L (ref 96–112)
Creatinine, Ser: 1.43 mg/dL (ref 0.40–1.50)
GFR: 48.37 mL/min — ABNORMAL LOW (ref >60.00)
Glucose, Bld: 139 mg/dL — ABNORMAL HIGH (ref 70–99)
Potassium: 4.3 meq/L (ref 3.5–5.1)
Sodium: 136 meq/L (ref 135–145)

## 2023-09-11 NOTE — Telephone Encounter (Signed)
 Called patient to discuss lab results. Continue current regimen. Pt instructed to continue monitoring BP at home and to call if he is noticing increased readings.  Rainelle Bur, PharmD, BCPS, CPP Clinical Pharmacist Practitioner [facility] [phone]

## 2023-09-12 ENCOUNTER — Other Ambulatory Visit: Payer: Self-pay | Admitting: Internal Medicine

## 2023-09-12 DIAGNOSIS — J301 Allergic rhinitis due to pollen: Secondary | ICD-10-CM

## 2023-09-30 ENCOUNTER — Other Ambulatory Visit: Payer: Self-pay | Admitting: Internal Medicine

## 2023-09-30 DIAGNOSIS — E876 Hypokalemia: Secondary | ICD-10-CM

## 2023-09-30 DIAGNOSIS — I10 Essential (primary) hypertension: Secondary | ICD-10-CM

## 2023-10-24 ENCOUNTER — Other Ambulatory Visit: Payer: Self-pay | Admitting: Hematology and Oncology

## 2023-10-24 ENCOUNTER — Inpatient Hospital Stay (HOSPITAL_BASED_OUTPATIENT_CLINIC_OR_DEPARTMENT_OTHER): Payer: Medicare Other | Admitting: Hematology and Oncology

## 2023-10-24 ENCOUNTER — Inpatient Hospital Stay: Payer: Medicare Other | Attending: Hematology and Oncology

## 2023-10-24 VITALS — BP 154/74 | HR 62 | Temp 98.4°F | Resp 13 | Wt 195.0 lb

## 2023-10-24 DIAGNOSIS — C680 Malignant neoplasm of urethra: Secondary | ICD-10-CM

## 2023-10-24 DIAGNOSIS — Z8551 Personal history of malignant neoplasm of bladder: Secondary | ICD-10-CM | POA: Insufficient documentation

## 2023-10-24 LAB — CBC WITH DIFFERENTIAL (CANCER CENTER ONLY)
Abs Immature Granulocytes: 0.02 K/uL (ref 0.00–0.07)
Basophils Absolute: 0 K/uL (ref 0.0–0.1)
Basophils Relative: 1 %
Eosinophils Absolute: 0.3 K/uL (ref 0.0–0.5)
Eosinophils Relative: 4 %
HCT: 40.7 % (ref 39.0–52.0)
Hemoglobin: 13.4 g/dL (ref 13.0–17.0)
Immature Granulocytes: 0 %
Lymphocytes Relative: 30 %
Lymphs Abs: 2.1 K/uL (ref 0.7–4.0)
MCH: 29.1 pg (ref 26.0–34.0)
MCHC: 32.9 g/dL (ref 30.0–36.0)
MCV: 88.3 fL (ref 80.0–100.0)
Monocytes Absolute: 0.7 K/uL (ref 0.1–1.0)
Monocytes Relative: 10 %
Neutro Abs: 4 K/uL (ref 1.7–7.7)
Neutrophils Relative %: 55 %
Platelet Count: 213 K/uL (ref 150–400)
RBC: 4.61 MIL/uL (ref 4.22–5.81)
RDW: 14.3 % (ref 11.5–15.5)
WBC Count: 7.3 K/uL (ref 4.0–10.5)
nRBC: 0 % (ref 0.0–0.2)

## 2023-10-24 LAB — CMP (CANCER CENTER ONLY)
ALT: 16 U/L (ref 0–44)
AST: 16 U/L (ref 15–41)
Albumin: 4.3 g/dL (ref 3.5–5.0)
Alkaline Phosphatase: 89 U/L (ref 38–126)
Anion gap: 6 (ref 5–15)
BUN: 18 mg/dL (ref 8–23)
CO2: 24 mmol/L (ref 22–32)
Calcium: 10.1 mg/dL (ref 8.9–10.3)
Chloride: 109 mmol/L (ref 98–111)
Creatinine: 1.16 mg/dL (ref 0.61–1.24)
GFR, Estimated: >60 mL/min (ref >60)
Glucose, Bld: 92 mg/dL (ref 70–99)
Potassium: 4.5 mmol/L (ref 3.5–5.1)
Sodium: 139 mmol/L (ref 135–145)
Total Bilirubin: 0.5 mg/dL (ref 0.0–1.2)
Total Protein: 7.2 g/dL (ref 6.5–8.1)

## 2023-10-24 NOTE — Progress Notes (Signed)
 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 Amadeo. Noted to be in complete remission after 5 years of immunotherapy. Moved to surveillance.  10/22/2022: establish care with Dr. Federico   Interval History:  Mario Hubbard 73 y.o. 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 The patient's last visit was on 05/07/2023. In the interim since the last visit he has had no major changes in his health.  On exam today Mr. Vonderhaar reports he has been doing well overall in the interim since her last visit in January 2025.  He reports his weight is down to 185 pounds, he was as high as 204 pounds at his heaviest.  He reports he is doing his best to try to stay active such as walking, cutting the grass, and modifying his diet.  He reports he is try to eat more fruit.  He reports he is not currently having any pain though he can sometimes be sore after doing yard work.  He notes he is not having any trouble with fevers, chills, sweats.  He denies any bleeding or bruising.  He is also had no recent hospitalizations, ER visits, or new medications.  He looks forward to a fishing trip to [place] in the near future.  He notes he will be trying to catch [place].  Otherwise he denies anything out of the ordinary.  A full 10 point ROS is otherwise negative.  MEDICAL HISTORY:  Past Medical History:  Diagnosis Date   Arthritis    Hypertension     SURGICAL HISTORY: No past surgical history on file.  SOCIAL HISTORY: Social History   Socioeconomic History   Marital status: Married    Spouse name: Not on file   Number of children: Not on file   Years of  education: Not on file   Highest education level: Associate degree: academic program  Occupational History   Not on file  Tobacco Use   Smoking status: Former   Smokeless tobacco: Never  Substance and Sexual Activity   Alcohol use: Never   Drug use: Never   Sexual activity: Not Currently    Partners: Female  Other Topics Concern   Not on file  Social History Narrative   Not on file   Social Drivers of Health   Financial Resource Strain: Low Risk  (07/14/2023)   Overall Financial Resource Strain (CARDIA)    Difficulty of Paying Living Expenses: Not hard at all  Food Insecurity: No Food Insecurity (07/14/2023)   Hunger Vital Sign    Worried About Running Out of Food in the Last Year: Never true    Ran Out of Food in the Last Year: Never true  Transportation Needs: No Transportation Needs (07/14/2023)   PRAPARE - Administrator, Civil Service (Medical): No    Lack of Transportation (Non-Medical): No  Physical Activity: Sufficiently Active (07/14/2023)   Exercise Vital Sign    Days of Exercise per Week: 6 days    Minutes of Exercise per Session: 150+ min  Stress: No Stress Concern Present (07/14/2023)   [employer] of Occupational Health - Occupational Stress Questionnaire    Feeling of Stress : Not at all  Social  Connections: Socially Integrated (07/14/2023)   Social Connection and Isolation Panel    Frequency of Communication with Friends and Family: More than three times a week    Frequency of Social Gatherings with Friends and Family: More than three times a week    Attends Religious Services: More than 4 times per year    Active Member of [employer] or Organizations: Yes    Attends Engineer, structural: More than 4 times per year    Marital Status: Married  Catering manager Violence: Not At Risk (12/24/2022)   Humiliation, Afraid, Rape, and Kick questionnaire    Fear of Current or Ex-Partner: No    Emotionally Abused: No    Physically Abused: No    Sexually  Abused: No    FAMILY HISTORY: Family History  Problem Relation Age of Onset   Kidney disease Mother    Hypertension Mother    Healthy Sister    Diabetes Brother     ALLERGIES:  has no known allergies.  MEDICATIONS:  Current Outpatient Medications  Medication Sig Dispense Refill   amLODipine  (NORVASC ) 10 MG tablet TAKE 1 TABLET BY MOUTH EVERY DAY 90 tablet 1   atorvastatin  (LIPITOR) 20 MG tablet TAKE 1 TABLET BY MOUTH EVERY DAY 90 tablet 1   fluticasone  (FLONASE ) 50 MCG/ACT nasal spray Place 2 sprays into both nostrils daily. 48 mL 1   levocetirizine (XYZAL ) 5 MG tablet TAKE 1 TABLET BY MOUTH EVERY DAY IN THE EVENING 90 tablet 0   losartan  (COZAAR ) 25 MG tablet Take 1 tablet (25 mg total) by mouth daily. 90 tablet 0   metoprolol  succinate (TOPROL  XL) 50 MG 24 hr tablet Take 1 tablet (50 mg total) by mouth daily. Take with or immediately following a meal. 90 tablet 1   potassium chloride  SA (KLOR-CON  M) 20 MEQ tablet TAKE 1 TABLET BY MOUTH TWICE A DAY 180 tablet 0   sildenafil  (VIAGRA ) 50 MG tablet Take 1 tablet (50 mg total) by mouth daily as needed for erectile dysfunction. 10 tablet 11   spironolactone  (ALDACTONE ) 25 MG tablet TAKE 1 TABLET (25 MG TOTAL) BY MOUTH DAILY. 90 tablet 0   No current facility-administered medications for this visit.    REVIEW OF SYSTEMS:   Constitutional: ( - ) fevers, ( - )  chills , ( - ) night sweats Eyes: ( - ) blurriness of vision, ( - ) double vision, ( - ) watery eyes Ears, nose, mouth, throat, and face: ( - ) mucositis, ( - ) sore throat Respiratory: ( - ) cough, ( - ) dyspnea, ( - ) wheezes Cardiovascular: ( - ) palpitation, ( - ) chest discomfort, ( - ) lower extremity swelling Gastrointestinal:  ( - ) nausea, ( - ) heartburn, ( - ) change in bowel habits Skin: ( - ) abnormal skin rashes Lymphatics: ( - ) new lymphadenopathy, ( - ) easy bruising Neurological: ( - ) numbness, ( - ) tingling, ( - ) new weaknesses Behavioral/Psych: ( - )  mood change, ( - ) new changes  All other systems were reviewed with the patient and are negative.  PHYSICAL EXAMINATION: ECOG PERFORMANCE STATUS: 0 - Asymptomatic  Vitals:   10/24/23 0853  BP: (!) 154/74  Pulse: 62  Resp: 13  Temp: 98.4 F (36.9 C)  SpO2: 95%    Filed Weights   10/24/23 0853  Weight: 195 lb (88.5 kg)     GENERAL: Well-appearing elderly African-American male, alert, no distress and comfortable SKIN:  skin color, texture, turgor are normal, no rashes or significant lesions EYES: conjunctiva are pink and non-injected, sclera clear LUNGS: clear to auscultation and percussion with normal breathing effort HEART: regular rate & rhythm and no murmurs and no lower extremity edema Musculoskeletal: no cyanosis of digits and no clubbing  PSYCH: alert & oriented x 3, fluent speech NEURO: no focal motor/sensory deficits  LABORATORY DATA:  I have reviewed the data as listed    Latest Ref Rng & Units 10/24/2023    8:07 AM 04/25/2023    8:15 AM 10/17/2022    7:46 AM  CBC  WBC 4.0 - 10.5 K/uL 7.3  5.8  6.1   Hemoglobin 13.0 - 17.0 g/dL 86.5  84.4  86.5   Hematocrit 39.0 - 52.0 % 40.7  47.6  41.0   Platelets 150 - 400 K/uL 213  155  200        Latest Ref Rng & Units 10/24/2023    8:07 AM 09/11/2023    9:20 AM 07/16/2023    8:37 AM  CMP  Glucose 70 - 99 mg/dL 92  860  876   BUN 8 - 23 mg/dL 18  17  17    Creatinine 0.61 - 1.24 mg/dL 8.83  8.56  8.70   Sodium 135 - 145 mmol/L 139  136  140   Potassium 3.5 - 5.1 mmol/L 4.5  4.3  4.3   Chloride 98 - 111 mmol/L 109  106  108   CO2 22 - 32 mmol/L 24  22  22    Calcium  8.9 - 10.3 mg/dL 89.8  9.6  89.7   Total Protein 6.5 - 8.1 g/dL 7.2     Total Bilirubin 0.0 - 1.2 mg/dL 0.5     Alkaline Phos 38 - 126 U/L 89     AST 15 - 41 U/L 16     ALT 0 - 44 U/L 16       RADIOGRAPHIC STUDIES: No results found.  ASSESSMENT & PLAN Mario Hubbard 73 y.o. male with medical history significant for Stage IV urothelial carcinoma in  complete remission who presents for a follow up visit.   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 Will repeat scan this month. If scan looks good will recommend CT scans q 12 months moving forward.  --labs today show WBC 7.3, Hgb 13.4, MCV 88.3, Plt 213. Cr 1.16 -- recommend RTC in 12 months time with repeat CT scan.   # Port-A-Cath --port removed    Orders Placed This Encounter  Procedures   CT CHEST ABDOMEN PELVIS WO CONTRAST    Standing Status:   Future    Expected Date:   10/31/2023    Expiration Date:   10/23/2024    Preferred imaging location?:   [facility]    If indicated for the ordered procedure, I authorize the administration of oral contrast media per Radiology protocol:   Yes    Does the patient have a contrast media/X-ray dye allergy?:   No    All questions were answered. The patient knows to call the clinic with any problems, questions or concerns.  A total of more than 25 minutes were spent on this encounter with face-to-face time and non-face-to-face time, including preparing to see the patient, ordering tests and/or medications, counseling the patient and coordination of care as outlined above.  Norleen IVAR Kidney, MD Department of Hematology/Oncology [facility] Phone: [phone] Pager: [phone] Email: [email or web address]  10/24/2023 10:15 AM

## 2023-11-04 ENCOUNTER — Ambulatory Visit (HOSPITAL_COMMUNITY)
Admission: RE | Admit: 2023-11-04 | Discharge: 2023-11-04 | Disposition: A | Discharge status: Home / Self Care | Source: Ambulatory Visit | Attending: Hematology and Oncology | Admitting: Hematology and Oncology

## 2023-11-04 DIAGNOSIS — C680 Malignant neoplasm of urethra: Secondary | ICD-10-CM | POA: Insufficient documentation

## 2023-11-18 ENCOUNTER — Other Ambulatory Visit: Payer: Self-pay | Admitting: Internal Medicine

## 2023-11-18 DIAGNOSIS — I10 Essential (primary) hypertension: Secondary | ICD-10-CM

## 2023-11-20 ENCOUNTER — Other Ambulatory Visit: Payer: Self-pay | Admitting: Internal Medicine

## 2023-11-20 DIAGNOSIS — E785 Hyperlipidemia, unspecified: Secondary | ICD-10-CM

## 2023-11-20 DIAGNOSIS — I10 Essential (primary) hypertension: Secondary | ICD-10-CM

## 2023-11-22 ENCOUNTER — Other Ambulatory Visit: Payer: Self-pay | Admitting: Internal Medicine

## 2023-11-22 DIAGNOSIS — I10 Essential (primary) hypertension: Secondary | ICD-10-CM

## 2023-11-28 ENCOUNTER — Other Ambulatory Visit: Payer: Self-pay | Admitting: Internal Medicine

## 2023-11-28 DIAGNOSIS — E876 Hypokalemia: Secondary | ICD-10-CM

## 2023-11-28 DIAGNOSIS — I10 Essential (primary) hypertension: Secondary | ICD-10-CM

## 2023-12-12 ENCOUNTER — Other Ambulatory Visit: Payer: Self-pay | Admitting: Internal Medicine

## 2023-12-12 DIAGNOSIS — J301 Allergic rhinitis due to pollen: Secondary | ICD-10-CM

## 2023-12-25 ENCOUNTER — Ambulatory Visit (INDEPENDENT_AMBULATORY_CARE_PROVIDER_SITE_OTHER): Payer: Medicare Other

## 2023-12-25 VITALS — Ht 69.0 in | Wt 195.0 lb

## 2023-12-25 DIAGNOSIS — Z0000 Encounter for general adult medical examination without abnormal findings: Secondary | ICD-10-CM

## 2023-12-25 NOTE — Progress Notes (Signed)
 Subjective:   Mario Hubbard is a 73 y.o. who presents for a Medicare Wellness preventive visit.  As a reminder, Annual Wellness Visits don't include a physical exam, and some assessments may be limited, especially if this visit is performed virtually. We may recommend an in-person follow-up visit with your provider if needed.  Visit Complete: Virtual I connected with  Lamar Radar on 12/25/23 by a audio enabled telemedicine application and verified that I am speaking with the correct person using two identifiers.  Patient Location: Home  Provider Location: Office/Clinic  I discussed the limitations of evaluation and management by telemedicine. The patient expressed understanding and agreed to proceed.  Vital Signs: Because this visit was a virtual/telehealth visit, some criteria may be missing or patient reported. Any vitals not documented were not able to be obtained and vitals that have been documented are patient reported.  VideoDeclined- This patient declined Librarian, academic. Therefore the visit was completed with audio only.  Persons Participating in Visit: Patient.  AWV Questionnaire: No: Patient Medicare AWV questionnaire was not completed prior to this visit.  Cardiac Risk Factors include: advanced age (>86men, >59 women);dyslipidemia;hypertension;male gender     Objective:    Today's Vitals   12/25/23 1013  Weight: 195 lb (88.5 kg)  Height: 5' 9 (1.753 m)   Body mass index is 28.8 kg/m.     12/25/2023   10:14 AM 12/24/2022   11:05 AM 07/02/2022   10:20 AM 12/21/2021   10:42 AM 09/20/2021    8:51 AM 08/10/2021    9:27 AM 02/01/2021    9:03 AM  Advanced Directives  Does Patient Have a Medical Advance Directive? No No No No No No No  Would patient like information on creating a medical advance directive? Yes (MAU/Ambulatory/Procedural Areas - Information given) No - Patient declined  No - Patient declined No - Patient declined No - Patient  declined No - Patient declined    Current Medications (verified) Outpatient Encounter Medications as of 12/25/2023  Medication Sig   amLODipine  (NORVASC ) 10 MG tablet TAKE 1 TABLET BY MOUTH EVERY DAY   atorvastatin  (LIPITOR) 20 MG tablet TAKE 1 TABLET BY MOUTH EVERY DAY   fluticasone  (FLONASE ) 50 MCG/ACT nasal spray Place 2 sprays into both nostrils daily.   levocetirizine (XYZAL ) 5 MG tablet TAKE 1 TABLET BY MOUTH EVERY DAY IN THE EVENING   losartan  (COZAAR ) 25 MG tablet TAKE 1 TABLET (25 MG TOTAL) BY MOUTH DAILY.   metoprolol  succinate (TOPROL -XL) 50 MG 24 hr tablet TAKE 1 TABLET BY MOUTH DAILY. TAKE WITH OR IMMEDIATELY FOLLOWING A MEAL.   potassium chloride  SA (KLOR-CON  M) 20 MEQ tablet TAKE 1 TABLET BY MOUTH TWICE A DAY   sildenafil  (VIAGRA ) 50 MG tablet Take 1 tablet (50 mg total) by mouth daily as needed for erectile dysfunction.   spironolactone  (ALDACTONE ) 25 MG tablet TAKE 1 TABLET (25 MG TOTAL) BY MOUTH DAILY.   No facility-administered encounter medications on file as of 12/25/2023.    Allergies (verified) Patient has no known allergies.   History: Past Medical History:  Diagnosis Date   Arthritis    Hypertension    History reviewed. No pertinent surgical history. Family History  Problem Relation Age of Onset   Kidney disease Mother    Hypertension Mother    Healthy Sister    Diabetes Brother    Social History   Socioeconomic History   Marital status: Married    Spouse name: Not on file  Number of children: Not on file   Years of education: Not on file   Highest education level: Associate degree: academic program  Occupational History   Not on file  Tobacco Use   Smoking status: Former   Smokeless tobacco: Never  Substance and Sexual Activity   Alcohol use: Never   Drug use: Never   Sexual activity: Yes    Partners: Female  Other Topics Concern   Not on file  Social History Narrative   Married   Social Drivers of Health   Financial Resource  Strain: Low Risk  (12/25/2023)   Overall Financial Resource Strain (CARDIA)    Difficulty of Paying Living Expenses: Not hard at all  Food Insecurity: No Food Insecurity (12/25/2023)   Hunger Vital Sign    Worried About Running Out of Food in the Last Year: Never true    Ran Out of Food in the Last Year: Never true  Transportation Needs: No Transportation Needs (12/25/2023)   PRAPARE - Administrator, Civil Service (Medical): No    Lack of Transportation (Non-Medical): No  Physical Activity: Sufficiently Active (12/25/2023)   Exercise Vital Sign    Days of Exercise per Week: 7 days    Minutes of Exercise per Session: 60 min  Stress: No Stress Concern Present (12/25/2023)   [employer] of Occupational Health - Occupational Stress Questionnaire    Feeling of Stress: Not at all  Social Connections: Socially Integrated (12/25/2023)   Social Connection and Isolation Panel    Frequency of Communication with Friends and Family: More than three times a week    Frequency of Social Gatherings with Friends and Family: More than three times a week    Attends Religious Services: More than 4 times per year    Active Member of [employer] or Organizations: Yes    Attends Engineer, structural: More than 4 times per year    Marital Status: Married    Tobacco Counseling Counseling given: No    Clinical Intake:  Pre-visit preparation completed: Yes  Pain : No/denies pain     BMI - recorded: 28.8 Nutritional Status: BMI 25 -29 Overweight Nutritional Risks: None Diabetes: No  Lab Results  Component Value Date   HGBA1C 5.8 07/16/2023   HGBA1C 5.6 09/13/2022   HGBA1C 5.9 09/13/2021     How often do you need to have someone help you when you read instructions, pamphlets, or other written materials from your doctor or pharmacy?: 1 - Never  Interpreter Needed?: No  Information entered by :: Verdie Saba, CMA   Activities of Daily Living     12/25/2023   10:16 AM   In your present state of health, do you have any difficulty performing the following activities:  Hearing? 0  Vision? 0  Difficulty concentrating or making decisions? 0  Walking or climbing stairs? 0  Dressing or bathing? 0  Doing errands, shopping? 0  Preparing Food and eating ? N  Using the Toilet? N  In the past six months, have you accidently leaked urine? N  Do you have problems with loss of bowel control? N  Managing your Medications? N  Managing your Finances? N  Housekeeping or managing your Housekeeping? N    Patient Care Team: Joshua Debby CROME, MD as PCP - General (Internal Medicine) Merceda Lela SAUNDERS, [facility] (Pharmacist) Cyrus Carwin, MD as Consulting Physician (Ophthalmology)  I have updated your Care Teams any recent Medical Services you may have received from other providers  in the past year.     Assessment:   This is a routine wellness examination for Mario Hubbard.  Hearing/Vision screen Hearing Screening - Comments:: Denies hearing difficulties   Vision Screening - Comments:: Wears rx glasses - up to date with routine eye exams with [facility]   Goals Addressed               This Visit's Progress     Patient Stated (pt-stated)        Patient stated he plans to continue walking and watching diet        Depression Screen     12/25/2023   10:18 AM 07/16/2023    8:02 AM 12/24/2022   11:07 AM 07/03/2022    2:43 PM 12/21/2021   11:52 AM 12/21/2021   10:49 AM 09/13/2021    3:43 PM  PHQ 2/9 Scores  PHQ - 2 Score 0 0 0 0 0 0 0  PHQ- 9 Score 0  0        Fall Risk     12/25/2023   10:17 AM 07/16/2023    8:02 AM 12/24/2022   11:07 AM 12/20/2022    8:44 PM 07/03/2022    2:42 PM  Fall Risk   Falls in the past year? 0 0 0 0 0  Number falls in past yr: 0 0 0 0 0  Injury with Fall? 0 0 0 0 0  Risk for fall due to : No Fall Risks No Fall Risks No Fall Risks  No Fall Risks  Follow up Falls evaluation completed;Falls prevention discussed Falls evaluation completed  Falls prevention discussed  Falls evaluation completed    MEDICARE RISK AT HOME:  Medicare Risk at Home Any stairs in or around the home?: No If so, are there any without handrails?: No Home free of loose throw rugs in walkways, pet beds, electrical cords, etc?: Yes Adequate lighting in your home to reduce risk of falls?: Yes Life alert?: No Use of a cane, walker or w/c?: No Grab bars in the bathroom?: No Shower chair or bench in shower?: No Elevated toilet seat or a handicapped toilet?: No  TIMED UP AND GO:  Was the test performed?  No  Cognitive Function: 6CIT completed        12/25/2023   10:20 AM 12/24/2022   11:08 AM 12/21/2021   10:49 AM 12/20/2020    5:10 PM  6CIT Screen  What Year? 0 points 0 points 0 points 0 points  What month? 0 points 0 points 0 points 0 points  What time? 0 points 0 points 0 points 0 points  Count back from 20 0 points 0 points 0 points 0 points  Months in reverse 0 points 0 points 0 points 0 points  Repeat phrase 0 points 0 points 0 points 2 points  Total Score 0 points 0 points 0 points 2 points    Immunizations Immunization History  Administered Date(s) Administered   Fluad Quad(high Dose 65+) 12/28/2020   INFLUENZA, HIGH DOSE SEASONAL PF 01/17/2022   Influenza-Unspecified 01/05/2020   Moderna SARS-COV2 Booster Vaccination 01/17/2022   PFIZER(Purple Top)SARS-COV-2 Vaccination 05/20/2019, 06/17/2019, 03/22/2020   Pneumococcal Conjugate-13 10/09/2016   Pneumococcal Polysaccharide-23 01/17/2018   Tdap 05/14/2017    Screening Tests Health Maintenance  Topic Date Due   Zoster Vaccines- Shingrix  (1 of 2) Never done   Influenza Vaccine  11/08/2023   COVID-19 Vaccine (4 - 2025-26 season) 12/09/2023   Colonoscopy  10/19/2024   Medicare Annual Wellness (  AWV)  12/24/2024   DTaP/Tdap/Td (2 - Td or Tdap) 05/15/2027   Pneumococcal Vaccine: 50+ Years  Completed   Hepatitis C Screening  Completed   HPV VACCINES  Aged Out   Meningococcal B  Vaccine  Aged Out    Health Maintenance Items Addressed: 12/25/2023  Additional Screening:  Vision Screening: Recommended annual ophthalmology exams for early detection of glaucoma and other disorders of the eye. Is the patient up to date with their annual eye exam?  Yes  Who is the provider or what is the name of the office in which the patient attends annual eye exams? [facility]   Dental Screening: Recommended annual dental exams for proper oral hygiene  Community Resource Referral / Chronic Care Management: CRR required this visit?  No   CCM required this visit?  No   Plan:    I have personally reviewed and noted the following in the patient's chart:   Medical and social history Use of alcohol, tobacco or illicit drugs  Current medications and supplements including opioid prescriptions. Patient is not currently taking opioid prescriptions. Functional ability and status Nutritional status Physical activity Advanced directives List of other physicians Hospitalizations, surgeries, and ER visits in previous 12 months Vitals Screenings to include cognitive, depression, and falls Referrals and appointments  In addition, I have reviewed and discussed with patient certain preventive protocols, quality metrics, and best practice recommendations. A written personalized care plan for preventive services as well as general preventive health recommendations were provided to patient.   Verdie CHRISTELLA Saba, CMA   12/25/2023   After Visit Summary: (Declined) Due to this being a telephonic visit, with patients personalized plan was offered to patient but patient Declined AVS at this time   Notes: Nothing significant to report at this time.

## 2023-12-25 NOTE — Patient Instructions (Addendum)
 Mario Hubbard,  Thank you for taking the time for your Medicare Wellness Visit. I appreciate your continued commitment to your health goals. Please review the care plan we discussed, and feel free to reach out if I can assist you further.  Medicare recommends these wellness visits once per year to help you and your care team stay ahead of potential health issues. These visits are designed to focus on prevention, allowing your provider to concentrate on managing your acute and chronic conditions during your regular appointments.  Please note that Annual Wellness Visits do not include a physical exam. Some assessments may be limited, especially if the visit was conducted virtually. If needed, we may recommend a separate in-person follow-up with your provider.  Ongoing Care Seeing your primary care provider every 3 to 6 months helps us  monitor your health and provide consistent, personalized care.   Referrals If a referral was made during today's visit and you haven't received any updates within two weeks, please contact the referred provider directly to check on the status.  Recommended Screenings:  Health Maintenance  Topic Date Due   Zoster (Shingles) Vaccine (1 of 2) Never done   Flu Shot  11/08/2023   COVID-19 Vaccine (4 - 2025-26 season) 12/09/2023   Colon Cancer Screening  10/19/2024   Medicare Annual Wellness Visit  12/24/2024   DTaP/Tdap/Td vaccine (2 - Td or Tdap) 05/15/2027   Pneumococcal Vaccine for age over 59  Completed   Hepatitis C Screening  Completed   HPV Vaccine  Aged Out   Meningitis B Vaccine  Aged Out       12/25/2023   10:14 AM  Advanced Directives  Does Patient Have a Medical Advance Directive? No  Would patient like information on creating a medical advance directive? Yes (MAU/Ambulatory/Procedural Areas - Information given)   Advance Care Planning is important because it: Ensures you receive medical care that aligns with your values, goals, and  preferences. Provides guidance to your family and loved ones, reducing the emotional burden of decision-making during critical moments.  Vision: Annual vision screenings are recommended for early detection of glaucoma, cataracts, and diabetic retinopathy. These exams can also reveal signs of chronic conditions such as diabetes and high blood pressure.  Dental: Annual dental screenings help detect early signs of oral cancer, gum disease, and other conditions linked to overall health, including heart disease and diabetes.

## 2023-12-29 ENCOUNTER — Other Ambulatory Visit: Payer: Self-pay | Admitting: Internal Medicine

## 2023-12-29 DIAGNOSIS — E876 Hypokalemia: Secondary | ICD-10-CM

## 2023-12-29 DIAGNOSIS — I10 Essential (primary) hypertension: Secondary | ICD-10-CM

## 2024-01-07 ENCOUNTER — Other Ambulatory Visit: Payer: Self-pay | Admitting: Internal Medicine

## 2024-01-07 DIAGNOSIS — J301 Allergic rhinitis due to pollen: Secondary | ICD-10-CM

## 2024-01-15 ENCOUNTER — Ambulatory Visit: Payer: Self-pay | Admitting: Internal Medicine

## 2024-01-15 ENCOUNTER — Ambulatory Visit: Admitting: Internal Medicine

## 2024-01-15 ENCOUNTER — Encounter: Payer: Self-pay | Admitting: Internal Medicine

## 2024-01-15 VITALS — BP 136/78 | HR 58 | Temp 98.4°F | Resp 16 | Ht 69.0 in | Wt 195.0 lb

## 2024-01-15 DIAGNOSIS — E785 Hyperlipidemia, unspecified: Secondary | ICD-10-CM | POA: Diagnosis not present

## 2024-01-15 DIAGNOSIS — N401 Enlarged prostate with lower urinary tract symptoms: Secondary | ICD-10-CM

## 2024-01-15 DIAGNOSIS — I10 Essential (primary) hypertension: Secondary | ICD-10-CM | POA: Diagnosis not present

## 2024-01-15 DIAGNOSIS — R3912 Poor urinary stream: Secondary | ICD-10-CM | POA: Diagnosis not present

## 2024-01-15 DIAGNOSIS — Z23 Encounter for immunization: Secondary | ICD-10-CM | POA: Diagnosis not present

## 2024-01-15 DIAGNOSIS — J301 Allergic rhinitis due to pollen: Secondary | ICD-10-CM

## 2024-01-15 DIAGNOSIS — E876 Hypokalemia: Secondary | ICD-10-CM

## 2024-01-15 DIAGNOSIS — N1831 Chronic kidney disease, stage 3a: Secondary | ICD-10-CM

## 2024-01-15 LAB — LIPID PANEL
Cholesterol: 141 mg/dL (ref 0–200)
HDL: 43 mg/dL (ref >39.00)
LDL Cholesterol: 65 mg/dL (ref 0–99)
NonHDL: 97.59
Total CHOL/HDL Ratio: 3
Triglycerides: 163 mg/dL — ABNORMAL HIGH (ref 0.0–149.0)
VLDL: 32.6 mg/dL (ref 0.0–40.0)

## 2024-01-15 LAB — URINALYSIS, ROUTINE W REFLEX MICROSCOPIC
Bilirubin Urine: NEGATIVE
Hgb urine dipstick: NEGATIVE
Ketones, ur: NEGATIVE
Leukocytes,Ua: NEGATIVE
Nitrite: NEGATIVE
RBC / HPF: NONE SEEN (ref 0–?)
Specific Gravity, Urine: 1.01 (ref 1.000–1.030)
Total Protein, Urine: NEGATIVE
Urine Glucose: NEGATIVE
Urobilinogen, UA: 0.2 (ref 0.0–1.0)
pH: 6 (ref 5.0–8.0)

## 2024-01-15 LAB — BASIC METABOLIC PANEL WITH GFR
BUN: 22 mg/dL (ref 6–23)
CO2: 24 meq/L (ref 19–32)
Calcium: 9.8 mg/dL (ref 8.4–10.5)
Chloride: 104 meq/L (ref 96–112)
Creatinine, Ser: 1.34 mg/dL (ref 0.40–1.50)
GFR: 52.17 mL/min — ABNORMAL LOW (ref >60.00)
Glucose, Bld: 112 mg/dL — ABNORMAL HIGH (ref 70–99)
Potassium: 4.5 meq/L (ref 3.5–5.1)
Sodium: 137 meq/L (ref 135–145)

## 2024-01-15 LAB — PSA: PSA: 3.08 ng/mL (ref 0.10–4.00)

## 2024-01-15 MED ORDER — LOSARTAN POTASSIUM 25 MG PO TABS: 25.0000 mg | ORAL_TABLET | Freq: Every day | ORAL | 0 refills | Status: DC

## 2024-01-15 MED ORDER — COVID-19 MRNA VAC-TRIS(PFIZER) 30 MCG/0.3ML IM SUSY: 0.3000 mL | PREFILLED_SYRINGE | Freq: Once | INTRAMUSCULAR | 0 refills | Status: AC

## 2024-01-15 MED ORDER — LEVOCETIRIZINE DIHYDROCHLORIDE 5 MG PO TABS: 5.0000 mg | ORAL_TABLET | Freq: Every evening | ORAL | 0 refills | Status: AC

## 2024-01-15 MED ORDER — FLUTICASONE PROPIONATE 50 MCG/ACT NA SUSP
2.0000 | Freq: Every day | NASAL | 1 refills | Status: AC
Start: 2024-01-15 — End: ?

## 2024-01-15 MED ORDER — SILDENAFIL CITRATE 50 MG PO TABS: 50.0000 mg | ORAL_TABLET | Freq: Every day | ORAL | 11 refills | Status: AC | PRN

## 2024-01-15 MED ORDER — METOPROLOL SUCCINATE ER 50 MG PO TB24: 50.0000 mg | ORAL_TABLET | Freq: Every day | ORAL | 1 refills | Status: AC

## 2024-01-15 MED ORDER — ATORVASTATIN CALCIUM 20 MG PO TABS: 20.0000 mg | ORAL_TABLET | Freq: Every day | ORAL | 1 refills | Status: AC

## 2024-01-15 MED ORDER — SPIRONOLACTONE 25 MG PO TABS: 25.0000 mg | ORAL_TABLET | Freq: Every day | ORAL | 0 refills | Status: AC

## 2024-01-15 MED ORDER — POTASSIUM CHLORIDE CRYS ER 20 MEQ PO TBCR: 20.0000 meq | EXTENDED_RELEASE_TABLET | Freq: Two times a day (BID) | ORAL | 0 refills | Status: AC

## 2024-01-15 MED ORDER — AMLODIPINE BESYLATE 10 MG PO TABS: 10.0000 mg | ORAL_TABLET | Freq: Every day | ORAL | 1 refills | Status: AC

## 2024-01-15 NOTE — Progress Notes (Signed)
 Subjective:  Patient ID: Mario Hubbard, male    DOB: [birth year]  Age: 73 y.o. MRN: [id number]  CC: Hypertension and Hyperlipidemia   HPI Mario Hubbard presents for f/up   --  Discussed the use of AI scribe software for clinical note transcription with the patient, who gave verbal consent to proceed.  History of Present Illness Mario Hubbard is a 73 year old male who presents for routine follow-up.  He has a history of urethral cancer, for which he has not received treatment in about two years and his follow-up scans have shown clear results. He underwent scans and blood tests as part of his follow-up, which have shown clear results. He has not received any treatment for the cancer in the past two years and reports no symptoms such as hematuria, dysuria, or pelvic pain.   He remains active, engaging in activities such as cutting grass and fishing. He takes breaks during physical activities to avoid overexertion, which can lead to fatigue. No chest pain, shortness of breath, dizziness, or lightheadedness.  He recently received a flu shot and is planning to get a COVID vaccine at a local [facility] pharmacy.   Outpatient Medications Prior to Visit  Medication Sig Dispense Refill   amLODipine  (NORVASC ) 10 MG tablet TAKE 1 TABLET BY MOUTH EVERY DAY 90 tablet 1   atorvastatin  (LIPITOR) 20 MG tablet TAKE 1 TABLET BY MOUTH EVERY DAY 90 tablet 1   fluticasone  (FLONASE ) 50 MCG/ACT nasal spray SPRAY 2 SPRAYS INTO EACH NOSTRIL EVERY DAY 48 mL 1   levocetirizine (XYZAL ) 5 MG tablet TAKE 1 TABLET BY MOUTH EVERY DAY IN THE EVENING 90 tablet 0   losartan  (COZAAR ) 25 MG tablet TAKE 1 TABLET (25 MG TOTAL) BY MOUTH DAILY. 90 tablet 0   metoprolol  succinate (TOPROL -XL) 50 MG 24 hr tablet TAKE 1 TABLET BY MOUTH DAILY. TAKE WITH OR IMMEDIATELY FOLLOWING A MEAL. 90 tablet 1   potassium chloride  SA (KLOR-CON  M) 20 MEQ tablet TAKE 1 TABLET BY MOUTH TWICE A DAY 180 tablet 0   sildenafil  (VIAGRA ) 50 MG tablet Take 1 tablet  (50 mg total) by mouth daily as needed for erectile dysfunction. 10 tablet 11   spironolactone  (ALDACTONE ) 25 MG tablet TAKE 1 TABLET (25 MG TOTAL) BY MOUTH DAILY. 90 tablet 0   No facility-administered medications prior to visit.    ROS Review of Systems  Constitutional:  Positive for fatigue. Negative for appetite change, chills, diaphoresis and fever.  Respiratory: Negative.  Negative for cough, chest tightness, wheezing and stridor.   Cardiovascular:  Negative for chest pain, palpitations and leg swelling.  Gastrointestinal: Negative.  Negative for abdominal pain, constipation, diarrhea, nausea and vomiting.  Genitourinary: Negative.  Negative for difficulty urinating.  Musculoskeletal: Negative.  Negative for arthralgias and myalgias.  Neurological:  Negative for dizziness, weakness and light-headedness.  Hematological:  Negative for adenopathy. Does not bruise/bleed easily.  Psychiatric/Behavioral: Negative.      Objective:  BP 136/78 (BP Location: Left Arm, Patient Position: Sitting, Cuff Size: Normal)   Pulse (!) 58   Temp 98.4 F (36.9 C) (Temporal)   Resp 16   Ht 5' 9 (1.753 m)   Wt 195 lb (88.5 kg)   SpO2 96%   BMI 28.80 kg/m   BP Readings from Last 3 Encounters:  01/15/24 136/78  10/24/23 (!) 154/74  07/16/23 136/64    Wt Readings from Last 3 Encounters:  01/15/24 195 lb (88.5 kg)  12/25/23 195 lb (88.5 kg)  10/24/23 195  lb (88.5 kg)    Physical Exam Vitals reviewed.  Constitutional:      Appearance: Normal appearance.  HENT:     Nose: Nose normal.     Mouth/Throat:     Mouth: Mucous membranes are moist.  Eyes:     General: No scleral icterus.    Conjunctiva/sclera: Conjunctivae normal.  Cardiovascular:     Rate and Rhythm: Regular rhythm. Bradycardia present.     Heart sounds: No murmur heard.    No friction rub. No gallop.  Pulmonary:     Effort: Pulmonary effort is normal.     Breath sounds: No stridor. No wheezing, rhonchi or rales.   Abdominal:     General: Abdomen is flat.     Palpations: There is no mass.     Tenderness: There is no abdominal tenderness. There is no guarding.     Hernia: No hernia is present.  Musculoskeletal:        General: Normal range of motion.     Cervical back: Neck supple.     Right lower leg: No edema.     Left lower leg: No edema.  Skin:    General: Skin is warm and dry.     Findings: No rash.  Neurological:     General: No focal deficit present.     Mental Status: He is alert.  Psychiatric:        Mood and Affect: Mood normal.        Behavior: Behavior normal.     Lab Results  Component Value Date   WBC 7.3 10/24/2023   HGB 13.4 10/24/2023   HCT 40.7 10/24/2023   PLT 213 10/24/2023   GLUCOSE 112 (H) 01/15/2024   CHOL 141 01/15/2024   TRIG 163.0 (H) 01/15/2024   HDL 43.00 01/15/2024   LDLCALC 65 01/15/2024   ALT 16 10/24/2023   AST 16 10/24/2023   NA 137 01/15/2024   K 4.5 01/15/2024   CL 104 01/15/2024   CREATININE 1.34 01/15/2024   BUN 22 01/15/2024   CO2 24 01/15/2024   TSH 1.70 07/16/2023   PSA 3.08 01/15/2024   HGBA1C 5.8 07/16/2023    CT CHEST ABDOMEN PELVIS WO CONTRAST Result Date: 11/09/2023 CLINICAL DATA:  History bladder/urethral cancer. Assess treatment response. Cancer diagnosis 2015 EXAM: CT CHEST, ABDOMEN AND PELVIS WITHOUT CONTRAST TECHNIQUE: Multidetector CT imaging of the chest, abdomen and pelvis was performed following the standard protocol without IV contrast. RADIATION DOSE REDUCTION: This exam was performed according to the departmental dose-optimization program which includes automated exposure control, adjustment of the mA and/or kV according to patient size and/or use of iterative reconstruction technique. COMPARISON:  04/29/2023, 03/21/2020 FINDINGS: CT CHEST FINDINGS Cardiovascular: Heart is normal size. Calcified plaque over the left main and 3 vessel coronary arteries. Thoracic aorta is normal in caliber. There is calcified plaque over the  thoracic aorta. Remaining vascular structures are unremarkable. Mediastinum/Nodes: No mediastinal or hilar adenopathy. Remaining mediastinal structures are unremarkable. Approximate 2.5 cm right thyroid  nodule unchanged from 2021. Lungs/Pleura: Lungs are adequately inflated without acute airspace process or effusion. Minimal stable scarring over the right mid to upper lung. Airways are normal. Musculoskeletal: No focal abnormality. Progressive bilateral gynecomastia. CT ABDOMEN PELVIS FINDINGS Hepatobiliary: Liver, gallbladder and biliary tree are normal. Pancreas: Normal. Spleen: Normal. Adrenals/Urinary Tract: Adrenal glands are normal. Kidneys are normal in size. There is no hydronephrosis or nephrolithiasis. Stable cyst over the upper pole left kidney. Ureters and bladder are normal. Stomach/Bowel: Stomach and small  bowel are normal. Appendix is normal. Diverticulosis of the colon. Vascular/Lymphatic: Mild-to-moderate calcified plaque over the abdominal aorta which is normal in caliber. No adenopathy. Reproductive: Minimal prominence of the prostate which is unchanged. Penis and region of the urethra unchanged. Other: No free fluid or focal inflammatory change. Small umbilical hernia containing only peritoneal fat. Surgical clips over the left inguinal region. Musculoskeletal: No focal abnormality. IMPRESSION: 1. No acute findings in the chest, abdomen or pelvis. No evidence of metastatic disease. 2. Colonic diverticulosis. 3. Stable 2.5 cm right thyroid  nodule unchanged from 2021. Recommend follow-up ultrasound if not done previously. 4. Aortic atherosclerosis. Atherosclerotic coronary artery disease. 5. Small umbilical hernia containing only peritoneal fat. Aortic Atherosclerosis (ICD10-I70.0). Electronically Signed   By: Toribio Agreste M.D.   On: 11/09/2023 16:48    Assessment & Plan:   Primary hypertension- His BP is well controlled. -     amLODIPine  Besylate; Take 1 tablet (10 mg total) by mouth daily.   Dispense: 90 tablet; Refill: 1 -     Losartan  Potassium; Take 1 tablet (25 mg total) by mouth daily.  Dispense: 90 tablet; Refill: 0 -     Metoprolol  Succinate ER; Take 1 tablet (50 mg total) by mouth daily. TAKE WITH OR IMMEDIATELY FOLLOWING A MEAL.  Dispense: 90 tablet; Refill: 1 -     Potassium Chloride  Crys ER; Take 1 tablet (20 mEq total) by mouth 2 (two) times daily.  Dispense: 180 tablet; Refill: 0 -     Spironolactone ; Take 1 tablet (25 mg total) by mouth daily.  Dispense: 90 tablet; Refill: 0 -     Urinalysis, Routine w reflex microscopic; Future -     Basic metabolic panel with GFR; Future  Hyperlipidemia LDL goal <100- LDL goal achieved. Doing well on the statin  -     Atorvastatin  Calcium ; Take 1 tablet (20 mg total) by mouth daily.  Dispense: 90 tablet; Refill: 1 -     Lipid panel; Future  Seasonal allergic rhinitis due to pollen -     Fluticasone  Propionate; Place 2 sprays into both nostrils daily.  Dispense: 48 mL; Refill: 1 -     Levocetirizine Dihydrochloride ; Take 1 tablet (5 mg total) by mouth every evening.  Dispense: 90 tablet; Refill: 0  Chronic hypokalemia -     Potassium Chloride  Crys ER; Take 1 tablet (20 mEq total) by mouth 2 (two) times daily.  Dispense: 180 tablet; Refill: 0 -     Spironolactone ; Take 1 tablet (25 mg total) by mouth daily.  Dispense: 90 tablet; Refill: 0 -     Basic metabolic panel with GFR; Future  Immunization due -     Flu vaccine HIGH DOSE PF(Fluzone Trivalent) -     COVID-19 mRNA Vac-TriS(Pfizer); Inject 0.3 mLs into the muscle once for 1 dose.  Dispense: 0.3 mL; Refill: 0  Benign prostatic hyperplasia with weak urinary stream -     PSA; Future -     Urinalysis, Routine w reflex microscopic; Future  Stage 3a chronic kidney disease (HCC)- Will avoid nephrotoxic agents   Other orders -     Sildenafil  Citrate; Take 1 tablet (50 mg total) by mouth daily as needed for erectile dysfunction.  Dispense: 10 tablet; Refill:  11     Follow-up: Return in about 6 months (around 07/15/2024).  Debby Molt, MD

## 2024-01-15 NOTE — Patient Instructions (Signed)
 Hypertension, Adult High blood pressure (hypertension) is when the force of blood pumping through the arteries is too strong. The arteries are the blood vessels that carry blood from the heart throughout the body. Hypertension forces the heart to work harder to pump blood and may cause arteries to become narrow or stiff. Untreated or uncontrolled hypertension can lead to a heart attack, heart failure, a stroke, kidney disease, and other problems. A blood pressure reading consists of a higher number over a lower number. Ideally, your blood pressure should be below 120/80. The first ("top") number is called the systolic pressure. It is a measure of the pressure in your arteries as your heart beats. The second ("bottom") number is called the diastolic pressure. It is a measure of the pressure in your arteries as the heart relaxes. What are the causes? The exact cause of this condition is not known. There are some conditions that result in high blood pressure. What increases the risk? Certain factors may make you more likely to develop high blood pressure. Some of these risk factors are under your control, including: Smoking. Not getting enough exercise or physical activity. Being overweight. Having too much fat, sugar, calories, or salt (sodium) in your diet. Drinking too much alcohol. Other risk factors include: Having a personal history of heart disease, diabetes, high cholesterol, or kidney disease. Stress. Having a family history of high blood pressure and high cholesterol. Having obstructive sleep apnea. Age. The risk increases with age. What are the signs or symptoms? High blood pressure may not cause symptoms. Very high blood pressure (hypertensive crisis) may cause: Headache. Fast or irregular heartbeats (palpitations). Shortness of breath. Nosebleed. Nausea and vomiting. Vision changes. Severe chest pain, dizziness, and seizures. How is this diagnosed? This condition is diagnosed by  measuring your blood pressure while you are seated, with your arm resting on a flat surface, your legs uncrossed, and your feet flat on the floor. The cuff of the blood pressure monitor will be placed directly against the skin of your upper arm at the level of your heart. Blood pressure should be measured at least twice using the same arm. Certain conditions can cause a difference in blood pressure between your right and left arms. If you have a high blood pressure reading during one visit or you have normal blood pressure with other risk factors, you may be asked to: Return on a different day to have your blood pressure checked again. Monitor your blood pressure at home for 1 week or longer. If you are diagnosed with hypertension, you may have other blood or imaging tests to help your health care provider understand your overall risk for other conditions. How is this treated? This condition is treated by making healthy lifestyle changes, such as eating healthy foods, exercising more, and reducing your alcohol intake. You may be referred for counseling on a healthy diet and physical activity. Your health care provider may prescribe medicine if lifestyle changes are not enough to get your blood pressure under control and if: Your systolic blood pressure is above 130. Your diastolic blood pressure is above 80. Your personal target blood pressure may vary depending on your medical conditions, your age, and other factors. Follow these instructions at home: Eating and drinking  Eat a diet that is high in fiber and potassium, and low in sodium, added sugar, and fat. An example of this eating plan is called the DASH diet. DASH stands for Dietary Approaches to Stop Hypertension. To eat this way: Eat  plenty of fresh fruits and vegetables. Try to fill one half of your plate at each meal with fruits and vegetables. Eat whole grains, such as whole-wheat pasta, brown rice, or whole-grain bread. Fill about one  fourth of your plate with whole grains. Eat or drink low-fat dairy products, such as skim milk or low-fat yogurt. Avoid fatty cuts of meat, processed or cured meats, and poultry with skin. Fill about one fourth of your plate with lean proteins, such as fish, chicken without skin, beans, eggs, or tofu. Avoid pre-made and processed foods. These tend to be higher in sodium, added sugar, and fat. Reduce your daily sodium intake. Many people with hypertension should eat less than 1,500 mg of sodium a day. Do not drink alcohol if: Your health care provider tells you not to drink. You are pregnant, may be pregnant, or are planning to become pregnant. If you drink alcohol: Limit how much you have to: 0-1 drink a day for women. 0-2 drinks a day for men. Know how much alcohol is in your drink. In the [place] one drink equals one 12 oz bottle of beer (355 mL), one 5 oz glass of wine (148 mL), or one 1 oz glass of hard liquor (44 mL). Lifestyle  Work with your health care provider to maintain a healthy body weight or to lose weight. Ask what an ideal weight is for you. Get at least 30 minutes of exercise that causes your heart to beat faster (aerobic exercise) most days of the week. Activities may include walking, swimming, or biking. Include exercise to strengthen your muscles (resistance exercise), such as Pilates or lifting weights, as part of your weekly exercise routine. Try to do these types of exercises for 30 minutes at least 3 days a week. Do not use any products that contain nicotine or tobacco. These products include cigarettes, chewing tobacco, and vaping devices, such as e-cigarettes. If you need help quitting, ask your health care provider. Monitor your blood pressure at home as told by your health care provider. Keep all follow-up visits. This is important. Medicines Take over-the-counter and prescription medicines only as told by your health care provider. Follow directions carefully. Blood  pressure medicines must be taken as prescribed. Do not skip doses of blood pressure medicine. Doing this puts you at risk for problems and can make the medicine less effective. Ask your health care provider about side effects or reactions to medicines that you should watch for. Contact a health care provider if you: Think you are having a reaction to a medicine you are taking. Have headaches that keep coming back (recurring). Feel dizzy. Have swelling in your ankles. Have trouble with your vision. Get help right away if you: Develop a severe headache or confusion. Have unusual weakness or numbness. Feel faint. Have severe pain in your chest or abdomen. Vomit repeatedly. Have trouble breathing. These symptoms may be an emergency. Get help right away. Call 911. Do not wait to see if the symptoms will go away. Do not drive yourself to the hospital. Summary Hypertension is when the force of blood pumping through your arteries is too strong. If this condition is not controlled, it may put you at risk for serious complications. Your personal target blood pressure may vary depending on your medical conditions, your age, and other factors. For most people, a normal blood pressure is less than 120/80. Hypertension is treated with lifestyle changes, medicines, or a combination of both. Lifestyle changes include losing weight, eating a healthy,  low-sodium diet, exercising more, and limiting alcohol. This information is not intended to replace advice given to you by your health care provider. Make sure you discuss any questions you have with your health care provider. Document Revised: 01/31/2021 Document Reviewed: 01/31/2021 [employer] Patient Education  2024 [employer].

## 2024-05-12 ENCOUNTER — Other Ambulatory Visit: Payer: Self-pay | Admitting: Internal Medicine

## 2024-05-12 DIAGNOSIS — I10 Essential (primary) hypertension: Secondary | ICD-10-CM

## 2024-07-15 ENCOUNTER — Ambulatory Visit: Admitting: Internal Medicine

## 2024-10-23 ENCOUNTER — Ambulatory Visit: Admitting: Hematology and Oncology

## 2024-10-23 ENCOUNTER — Other Ambulatory Visit

## 2024-12-29 ENCOUNTER — Ambulatory Visit
